# Patient Record
Sex: Male | Born: 1945 | Race: White | Hispanic: No | Marital: Single | Smoking: Current every day smoker
Health system: Southern US, Community
[De-identification: ages and names within clinical notes are randomized; demographics above are authoritative.]

## PROBLEM LIST (undated history)

## (undated) DIAGNOSIS — C801 Malignant (primary) neoplasm, unspecified: Secondary | ICD-10-CM

## (undated) DIAGNOSIS — K219 Gastro-esophageal reflux disease without esophagitis: Secondary | ICD-10-CM

## (undated) DIAGNOSIS — I739 Peripheral vascular disease, unspecified: Secondary | ICD-10-CM

## (undated) DIAGNOSIS — C3490 Malignant neoplasm of unspecified part of unspecified bronchus or lung: Secondary | ICD-10-CM

## (undated) DIAGNOSIS — F4310 Post-traumatic stress disorder, unspecified: Secondary | ICD-10-CM

## (undated) DIAGNOSIS — F1011 Alcohol abuse, in remission: Secondary | ICD-10-CM

## (undated) DIAGNOSIS — F1010 Alcohol abuse, uncomplicated: Secondary | ICD-10-CM

## (undated) DIAGNOSIS — J449 Chronic obstructive pulmonary disease, unspecified: Secondary | ICD-10-CM

## (undated) DIAGNOSIS — F419 Anxiety disorder, unspecified: Secondary | ICD-10-CM

---

## 2006-02-07 ENCOUNTER — Inpatient Hospital Stay (HOSPITAL_COMMUNITY): Admission: EM | Admit: 2006-02-07 | Discharge: 2006-02-26 | Payer: Self-pay | Admitting: Emergency Medicine

## 2006-02-16 ENCOUNTER — Ambulatory Visit: Payer: Self-pay | Admitting: Physical Medicine & Rehabilitation

## 2007-03-19 ENCOUNTER — Emergency Department (HOSPITAL_COMMUNITY): Admission: EM | Admit: 2007-03-19 | Discharge: 2007-03-19 | Payer: Self-pay | Admitting: Emergency Medicine

## 2007-11-18 ENCOUNTER — Emergency Department (HOSPITAL_COMMUNITY): Admission: EM | Admit: 2007-11-18 | Discharge: 2007-11-18 | Payer: Self-pay | Admitting: Family Medicine

## 2008-04-07 ENCOUNTER — Ambulatory Visit: Payer: Self-pay | Admitting: Psychiatry

## 2008-04-07 ENCOUNTER — Other Ambulatory Visit: Payer: Self-pay | Admitting: Emergency Medicine

## 2008-04-07 ENCOUNTER — Inpatient Hospital Stay (HOSPITAL_COMMUNITY): Admission: RE | Admit: 2008-04-07 | Discharge: 2008-04-10 | Payer: Self-pay | Admitting: Psychiatry

## 2008-04-11 ENCOUNTER — Emergency Department (HOSPITAL_COMMUNITY): Admission: EM | Admit: 2008-04-11 | Discharge: 2008-04-12 | Payer: Self-pay | Admitting: Emergency Medicine

## 2008-04-16 ENCOUNTER — Emergency Department (HOSPITAL_COMMUNITY): Admission: EM | Admit: 2008-04-16 | Discharge: 2008-04-16 | Payer: Self-pay | Admitting: Emergency Medicine

## 2008-04-20 ENCOUNTER — Emergency Department (HOSPITAL_COMMUNITY): Admission: EM | Admit: 2008-04-20 | Discharge: 2008-04-20 | Payer: Self-pay | Admitting: Emergency Medicine

## 2008-04-27 ENCOUNTER — Emergency Department (HOSPITAL_COMMUNITY): Admission: EM | Admit: 2008-04-27 | Discharge: 2008-04-28 | Payer: Self-pay | Admitting: Emergency Medicine

## 2008-05-01 ENCOUNTER — Emergency Department (HOSPITAL_COMMUNITY): Admission: EM | Admit: 2008-05-01 | Discharge: 2008-05-02 | Payer: Self-pay | Admitting: Emergency Medicine

## 2008-05-06 ENCOUNTER — Emergency Department (HOSPITAL_COMMUNITY): Admission: EM | Admit: 2008-05-06 | Discharge: 2008-05-07 | Payer: Self-pay | Admitting: Emergency Medicine

## 2008-05-07 ENCOUNTER — Emergency Department (HOSPITAL_COMMUNITY): Admission: EM | Admit: 2008-05-07 | Discharge: 2008-05-07 | Payer: Self-pay | Admitting: Emergency Medicine

## 2008-05-10 ENCOUNTER — Emergency Department (HOSPITAL_COMMUNITY): Admission: EM | Admit: 2008-05-10 | Discharge: 2008-05-10 | Payer: Self-pay | Admitting: Emergency Medicine

## 2008-05-15 ENCOUNTER — Emergency Department (HOSPITAL_COMMUNITY): Admission: EM | Admit: 2008-05-15 | Discharge: 2008-05-15 | Payer: Self-pay | Admitting: Emergency Medicine

## 2008-05-23 ENCOUNTER — Emergency Department (HOSPITAL_COMMUNITY): Admission: EM | Admit: 2008-05-23 | Discharge: 2008-05-24 | Payer: Self-pay | Admitting: Emergency Medicine

## 2008-05-31 ENCOUNTER — Emergency Department (HOSPITAL_COMMUNITY): Admission: EM | Admit: 2008-05-31 | Discharge: 2008-06-01 | Payer: Self-pay | Admitting: Emergency Medicine

## 2008-06-05 ENCOUNTER — Emergency Department (HOSPITAL_COMMUNITY): Admission: EM | Admit: 2008-06-05 | Discharge: 2008-06-05 | Payer: Self-pay | Admitting: Emergency Medicine

## 2008-06-06 ENCOUNTER — Emergency Department (HOSPITAL_COMMUNITY): Admission: EM | Admit: 2008-06-06 | Discharge: 2008-06-06 | Payer: Self-pay | Admitting: Emergency Medicine

## 2008-06-08 ENCOUNTER — Emergency Department (HOSPITAL_COMMUNITY): Admission: EM | Admit: 2008-06-08 | Discharge: 2008-06-08 | Payer: Self-pay | Admitting: Emergency Medicine

## 2008-06-10 ENCOUNTER — Emergency Department (HOSPITAL_COMMUNITY): Admission: EM | Admit: 2008-06-10 | Discharge: 2008-06-10 | Payer: Self-pay | Admitting: Emergency Medicine

## 2008-06-19 ENCOUNTER — Emergency Department (HOSPITAL_COMMUNITY): Admission: EM | Admit: 2008-06-19 | Discharge: 2008-06-19 | Payer: Self-pay | Admitting: Emergency Medicine

## 2008-06-25 ENCOUNTER — Emergency Department (HOSPITAL_COMMUNITY): Admission: EM | Admit: 2008-06-25 | Discharge: 2008-06-25 | Payer: Self-pay | Admitting: Emergency Medicine

## 2008-07-18 ENCOUNTER — Emergency Department (HOSPITAL_COMMUNITY): Admission: EM | Admit: 2008-07-18 | Discharge: 2008-07-18 | Payer: Self-pay | Admitting: Emergency Medicine

## 2008-08-04 ENCOUNTER — Encounter: Payer: Self-pay | Admitting: Emergency Medicine

## 2008-08-04 ENCOUNTER — Ambulatory Visit: Payer: Self-pay | Admitting: Psychiatry

## 2008-08-05 ENCOUNTER — Inpatient Hospital Stay (HOSPITAL_COMMUNITY): Admission: RE | Admit: 2008-08-05 | Discharge: 2008-08-10 | Payer: Self-pay | Admitting: Psychiatry

## 2008-09-21 ENCOUNTER — Ambulatory Visit: Payer: Self-pay | Admitting: Cardiology

## 2008-09-24 DIAGNOSIS — J4489 Other specified chronic obstructive pulmonary disease: Secondary | ICD-10-CM | POA: Insufficient documentation

## 2008-09-24 DIAGNOSIS — F4310 Post-traumatic stress disorder, unspecified: Secondary | ICD-10-CM

## 2008-09-24 DIAGNOSIS — J449 Chronic obstructive pulmonary disease, unspecified: Secondary | ICD-10-CM

## 2008-09-24 DIAGNOSIS — E039 Hypothyroidism, unspecified: Secondary | ICD-10-CM | POA: Insufficient documentation

## 2008-09-24 DIAGNOSIS — F1010 Alcohol abuse, uncomplicated: Secondary | ICD-10-CM

## 2008-09-29 ENCOUNTER — Emergency Department (HOSPITAL_COMMUNITY): Admission: EM | Admit: 2008-09-29 | Discharge: 2008-09-29 | Payer: Self-pay | Admitting: Emergency Medicine

## 2008-10-04 ENCOUNTER — Ambulatory Visit: Payer: Self-pay | Admitting: Oncology

## 2008-10-07 ENCOUNTER — Other Ambulatory Visit: Payer: Self-pay | Admitting: Emergency Medicine

## 2008-10-07 ENCOUNTER — Ambulatory Visit: Payer: Self-pay | Admitting: *Deleted

## 2008-10-07 ENCOUNTER — Inpatient Hospital Stay (HOSPITAL_COMMUNITY): Admission: RE | Admit: 2008-10-07 | Discharge: 2008-10-15 | Payer: Self-pay | Admitting: *Deleted

## 2008-10-20 ENCOUNTER — Emergency Department (HOSPITAL_COMMUNITY): Admission: EM | Admit: 2008-10-20 | Discharge: 2008-10-20 | Payer: Self-pay | Admitting: Emergency Medicine

## 2008-10-25 ENCOUNTER — Ambulatory Visit (HOSPITAL_COMMUNITY): Admission: RE | Admit: 2008-10-25 | Discharge: 2008-10-25 | Payer: Self-pay | Admitting: Family Medicine

## 2008-10-25 ENCOUNTER — Ambulatory Visit: Payer: Self-pay | Admitting: Vascular Surgery

## 2008-10-25 ENCOUNTER — Encounter: Payer: Self-pay | Admitting: Family Medicine

## 2008-11-07 ENCOUNTER — Ambulatory Visit: Payer: Self-pay

## 2008-11-07 ENCOUNTER — Encounter: Payer: Self-pay | Admitting: Cardiology

## 2008-11-07 LAB — CONVERTED CEMR LAB
ALT: 19 units/L (ref 0–53)
AST: 18 units/L (ref 0–37)
CO2: 27 meq/L (ref 19–32)
Calcium: 9 mg/dL (ref 8.4–10.5)
Cholesterol: 215 mg/dL (ref 0–200)
Direct LDL: 145 mg/dL
Eosinophils Relative: 3.9 % (ref 0.0–5.0)
Free T4: 0.6 ng/dL (ref 0.6–1.6)
GFR calc Af Amer: 110 mL/min
Glucose, Bld: 121 mg/dL — ABNORMAL HIGH (ref 70–99)
Monocytes Relative: 7.6 % (ref 3.0–12.0)
Neutro Abs: 4.6 10*3/uL (ref 1.4–7.7)
Neutrophils Relative %: 63.8 % (ref 43.0–77.0)
Sodium: 140 meq/L (ref 135–145)
Total Protein: 6.9 g/dL (ref 6.0–8.3)
VLDL: 66 mg/dL — ABNORMAL HIGH (ref 0–40)
WBC: 7.4 10*3/uL (ref 4.5–10.5)

## 2008-12-03 ENCOUNTER — Emergency Department (HOSPITAL_COMMUNITY): Admission: EM | Admit: 2008-12-03 | Discharge: 2008-12-03 | Payer: Self-pay | Admitting: Emergency Medicine

## 2008-12-20 ENCOUNTER — Ambulatory Visit (HOSPITAL_COMMUNITY): Admission: RE | Admit: 2008-12-20 | Discharge: 2008-12-20 | Payer: Self-pay | Admitting: Urology

## 2009-01-02 ENCOUNTER — Emergency Department (HOSPITAL_COMMUNITY): Admission: EM | Admit: 2009-01-02 | Discharge: 2009-01-02 | Payer: Self-pay | Admitting: Emergency Medicine

## 2009-01-02 ENCOUNTER — Emergency Department (HOSPITAL_COMMUNITY): Admission: EM | Admit: 2009-01-02 | Discharge: 2009-01-03 | Payer: Self-pay | Admitting: Emergency Medicine

## 2009-01-16 ENCOUNTER — Emergency Department (HOSPITAL_COMMUNITY): Admission: EM | Admit: 2009-01-16 | Discharge: 2009-01-17 | Payer: Self-pay | Admitting: Emergency Medicine

## 2009-02-04 ENCOUNTER — Emergency Department (HOSPITAL_COMMUNITY): Admission: EM | Admit: 2009-02-04 | Discharge: 2009-02-04 | Payer: Self-pay | Admitting: Emergency Medicine

## 2009-05-05 ENCOUNTER — Emergency Department (HOSPITAL_COMMUNITY): Admission: EM | Admit: 2009-05-05 | Discharge: 2009-05-05 | Payer: Self-pay | Admitting: Emergency Medicine

## 2009-05-08 ENCOUNTER — Emergency Department (HOSPITAL_COMMUNITY): Admission: EM | Admit: 2009-05-08 | Discharge: 2009-05-08 | Payer: Self-pay | Admitting: Emergency Medicine

## 2009-05-27 ENCOUNTER — Other Ambulatory Visit: Payer: Self-pay | Admitting: Emergency Medicine

## 2009-05-28 ENCOUNTER — Inpatient Hospital Stay (HOSPITAL_COMMUNITY): Admission: AD | Admit: 2009-05-28 | Discharge: 2009-06-04 | Payer: Self-pay | Admitting: Psychiatry

## 2009-05-28 ENCOUNTER — Ambulatory Visit: Payer: Self-pay | Admitting: Psychiatry

## 2009-06-01 ENCOUNTER — Encounter (HOSPITAL_COMMUNITY): Payer: Self-pay | Admitting: Psychiatry

## 2009-08-29 ENCOUNTER — Other Ambulatory Visit (HOSPITAL_COMMUNITY): Payer: Self-pay | Admitting: Emergency Medicine

## 2009-08-29 ENCOUNTER — Ambulatory Visit: Payer: Self-pay | Admitting: Psychiatry

## 2009-08-29 ENCOUNTER — Inpatient Hospital Stay (HOSPITAL_COMMUNITY): Admission: AD | Admit: 2009-08-29 | Discharge: 2009-09-03 | Payer: Self-pay | Admitting: Psychiatry

## 2009-12-08 ENCOUNTER — Emergency Department (HOSPITAL_COMMUNITY): Admission: EM | Admit: 2009-12-08 | Discharge: 2009-12-08 | Payer: Self-pay | Admitting: Emergency Medicine

## 2010-01-06 ENCOUNTER — Emergency Department (HOSPITAL_COMMUNITY): Admission: EM | Admit: 2010-01-06 | Discharge: 2010-01-06 | Payer: Self-pay | Admitting: Emergency Medicine

## 2010-01-14 ENCOUNTER — Inpatient Hospital Stay (HOSPITAL_COMMUNITY): Admission: EM | Admit: 2010-01-14 | Discharge: 2010-01-16 | Payer: Self-pay | Admitting: Emergency Medicine

## 2010-01-14 ENCOUNTER — Ambulatory Visit: Payer: Self-pay | Admitting: Cardiology

## 2010-01-16 ENCOUNTER — Encounter (INDEPENDENT_AMBULATORY_CARE_PROVIDER_SITE_OTHER): Payer: Self-pay | Admitting: Internal Medicine

## 2010-02-04 ENCOUNTER — Other Ambulatory Visit: Payer: Self-pay | Admitting: Emergency Medicine

## 2010-02-04 ENCOUNTER — Ambulatory Visit: Payer: Self-pay | Admitting: Psychiatry

## 2010-02-04 ENCOUNTER — Inpatient Hospital Stay (HOSPITAL_COMMUNITY): Admission: AD | Admit: 2010-02-04 | Discharge: 2010-02-11 | Payer: Self-pay | Admitting: Psychiatry

## 2010-03-13 ENCOUNTER — Emergency Department (HOSPITAL_COMMUNITY): Admission: EM | Admit: 2010-03-13 | Discharge: 2010-03-13 | Payer: Self-pay | Admitting: Gastroenterology

## 2010-03-23 ENCOUNTER — Emergency Department (HOSPITAL_COMMUNITY): Admission: EM | Admit: 2010-03-23 | Discharge: 2010-03-24 | Payer: Self-pay | Admitting: Emergency Medicine

## 2010-03-24 ENCOUNTER — Ambulatory Visit: Payer: Self-pay | Admitting: Psychiatry

## 2010-03-31 ENCOUNTER — Emergency Department (HOSPITAL_COMMUNITY): Admission: EM | Admit: 2010-03-31 | Discharge: 2010-03-31 | Payer: Self-pay | Admitting: Emergency Medicine

## 2010-04-04 ENCOUNTER — Emergency Department (HOSPITAL_COMMUNITY): Admission: EM | Admit: 2010-04-04 | Discharge: 2010-04-04 | Payer: Self-pay | Admitting: Emergency Medicine

## 2010-04-06 ENCOUNTER — Emergency Department (HOSPITAL_COMMUNITY): Admission: EM | Admit: 2010-04-06 | Discharge: 2010-04-06 | Payer: Self-pay | Admitting: Emergency Medicine

## 2010-04-12 ENCOUNTER — Inpatient Hospital Stay (HOSPITAL_COMMUNITY): Admission: AD | Admit: 2010-04-12 | Discharge: 2010-04-20 | Payer: Self-pay | Admitting: Psychiatry

## 2010-04-12 ENCOUNTER — Other Ambulatory Visit: Payer: Self-pay | Admitting: Emergency Medicine

## 2010-05-08 ENCOUNTER — Emergency Department (HOSPITAL_COMMUNITY): Admission: EM | Admit: 2010-05-08 | Discharge: 2010-05-08 | Payer: Self-pay | Admitting: Emergency Medicine

## 2010-05-29 ENCOUNTER — Emergency Department (HOSPITAL_COMMUNITY): Admission: EM | Admit: 2010-05-29 | Discharge: 2010-05-29 | Payer: Self-pay | Admitting: Emergency Medicine

## 2010-07-07 ENCOUNTER — Emergency Department (HOSPITAL_COMMUNITY): Admission: EM | Admit: 2010-07-07 | Discharge: 2010-07-07 | Payer: Self-pay | Admitting: Emergency Medicine

## 2010-07-16 ENCOUNTER — Emergency Department (HOSPITAL_COMMUNITY): Admission: EM | Admit: 2010-07-16 | Discharge: 2010-07-16 | Payer: Self-pay | Admitting: Emergency Medicine

## 2010-07-25 ENCOUNTER — Emergency Department (HOSPITAL_COMMUNITY): Admission: EM | Admit: 2010-07-25 | Discharge: 2010-07-25 | Payer: Self-pay | Admitting: Emergency Medicine

## 2010-08-03 ENCOUNTER — Emergency Department (HOSPITAL_COMMUNITY): Admission: EM | Admit: 2010-08-03 | Discharge: 2010-08-03 | Payer: Self-pay | Admitting: Emergency Medicine

## 2010-08-05 ENCOUNTER — Emergency Department (HOSPITAL_COMMUNITY): Admission: EM | Admit: 2010-08-05 | Discharge: 2010-08-05 | Payer: Self-pay | Admitting: Emergency Medicine

## 2010-08-18 ENCOUNTER — Emergency Department (HOSPITAL_COMMUNITY): Admission: EM | Admit: 2010-08-18 | Discharge: 2010-08-19 | Payer: Self-pay | Admitting: Emergency Medicine

## 2010-09-19 ENCOUNTER — Emergency Department (HOSPITAL_COMMUNITY): Admission: EM | Admit: 2010-09-19 | Discharge: 2010-08-23 | Payer: Self-pay | Admitting: Emergency Medicine

## 2010-11-23 ENCOUNTER — Emergency Department (HOSPITAL_COMMUNITY)
Admission: EM | Admit: 2010-11-23 | Discharge: 2010-11-23 | Disposition: A | Discharge status: Home / Self Care | Payer: Medicare Other | Attending: Emergency Medicine | Admitting: Emergency Medicine

## 2010-11-23 DIAGNOSIS — R209 Unspecified disturbances of skin sensation: Secondary | ICD-10-CM | POA: Insufficient documentation

## 2010-11-23 DIAGNOSIS — R51 Headache: Secondary | ICD-10-CM | POA: Insufficient documentation

## 2010-11-23 DIAGNOSIS — Z85820 Personal history of malignant melanoma of skin: Secondary | ICD-10-CM | POA: Insufficient documentation

## 2010-11-23 DIAGNOSIS — F411 Generalized anxiety disorder: Secondary | ICD-10-CM | POA: Insufficient documentation

## 2010-11-23 LAB — COMPREHENSIVE METABOLIC PANEL
ALT: 15 U/L (ref 0–53)
Alkaline Phosphatase: 60 U/L (ref 39–117)
BUN: 10 mg/dL (ref 6–23)
Calcium: 9.2 mg/dL (ref 8.4–10.5)
Creatinine, Ser: 0.73 mg/dL (ref 0.4–1.5)
GFR calc Af Amer: >60 mL/min (ref >60)
Glucose, Bld: 101 mg/dL — ABNORMAL HIGH (ref 70–99)
Sodium: 137 mEq/L (ref 135–145)
Total Bilirubin: 0.4 mg/dL (ref 0.3–1.2)

## 2010-11-23 LAB — RAPID URINE DRUG SCREEN, HOSP PERFORMED
Amphetamines: NOT DETECTED
Barbiturates: NOT DETECTED
Opiates: NOT DETECTED

## 2010-11-23 LAB — CBC
Hemoglobin: 15.4 g/dL (ref 13.0–17.0)
MCH: 29.8 pg (ref 26.0–34.0)
MCV: 89.3 fL (ref 78.0–100.0)
WBC: 7.3 10*3/uL (ref 4.0–10.5)

## 2010-11-23 LAB — DIFFERENTIAL
Eosinophils Relative: 4 % (ref 0–5)
Lymphocytes Relative: 26 % (ref 12–46)
Lymphs Abs: 1.9 10*3/uL (ref 0.7–4.0)
Monocytes Absolute: 0.6 10*3/uL (ref 0.1–1.0)
Monocytes Relative: 8 % (ref 3–12)
Neutro Abs: 4.4 10*3/uL (ref 1.7–7.7)

## 2010-11-23 LAB — ETHANOL: Alcohol, Ethyl (B): <5 mg/dL (ref 0–10)

## 2010-12-10 ENCOUNTER — Emergency Department (HOSPITAL_COMMUNITY)
Admission: EM | Admit: 2010-12-10 | Discharge: 2010-12-10 | Disposition: A | Discharge status: Home / Self Care | Payer: Medicare Other | Attending: Emergency Medicine | Admitting: Emergency Medicine

## 2010-12-10 DIAGNOSIS — Z85819 Personal history of malignant neoplasm of unspecified site of lip, oral cavity, and pharynx: Secondary | ICD-10-CM | POA: Insufficient documentation

## 2010-12-10 DIAGNOSIS — F4310 Post-traumatic stress disorder, unspecified: Secondary | ICD-10-CM | POA: Insufficient documentation

## 2010-12-10 DIAGNOSIS — F411 Generalized anxiety disorder: Secondary | ICD-10-CM | POA: Insufficient documentation

## 2010-12-10 DIAGNOSIS — Z85820 Personal history of malignant melanoma of skin: Secondary | ICD-10-CM | POA: Insufficient documentation

## 2010-12-24 LAB — RAPID URINE DRUG SCREEN, HOSP PERFORMED
Barbiturates: NOT DETECTED
Cocaine: NOT DETECTED
Opiates: NOT DETECTED
Tetrahydrocannabinol: NOT DETECTED

## 2010-12-24 LAB — CBC
HCT: 50.5 % (ref 39.0–52.0)
Hemoglobin: 17.3 g/dL — ABNORMAL HIGH (ref 13.0–17.0)
Platelets: 185 10*3/uL (ref 150–400)
RBC: 5.36 MIL/uL (ref 4.22–5.81)
RDW: 13.6 % (ref 11.5–15.5)

## 2010-12-24 LAB — MAGNESIUM: Magnesium: 2.3 mg/dL (ref 1.5–2.5)

## 2010-12-24 LAB — DIFFERENTIAL
Eosinophils Absolute: 0.1 10*3/uL (ref 0.0–0.7)
Eosinophils Relative: 2 % (ref 0–5)
Lymphocytes Relative: 31 % (ref 12–46)
Lymphs Abs: 2.1 10*3/uL (ref 0.7–4.0)

## 2010-12-24 LAB — URINALYSIS, ROUTINE W REFLEX MICROSCOPIC
Bilirubin Urine: NEGATIVE
Nitrite: NEGATIVE
Urobilinogen, UA: 0.2 mg/dL (ref 0.0–1.0)

## 2010-12-24 LAB — POCT CARDIAC MARKERS
CKMB, poc: <1 ng/mL — ABNORMAL LOW (ref 1.0–8.0)
CKMB, poc: <1 ng/mL — ABNORMAL LOW (ref 1.0–8.0)
CKMB, poc: <1 ng/mL — ABNORMAL LOW (ref 1.0–8.0)
Myoglobin, poc: 42.8 ng/mL (ref 12–200)
Troponin i, poc: <0.05 ng/mL (ref 0.00–0.09)

## 2010-12-24 LAB — COMPREHENSIVE METABOLIC PANEL
BUN: 4 mg/dL — ABNORMAL LOW (ref 6–23)
Creatinine, Ser: 0.71 mg/dL (ref 0.4–1.5)
GFR calc Af Amer: >60 mL/min (ref >60)
Potassium: 3.8 mEq/L (ref 3.5–5.1)

## 2010-12-25 LAB — CBC
HCT: 49.7 % (ref 39.0–52.0)
Hemoglobin: 17 g/dL (ref 13.0–17.0)
RBC: 5.32 MIL/uL (ref 4.22–5.81)

## 2010-12-25 LAB — DIFFERENTIAL
Lymphs Abs: 2 10*3/uL (ref 0.7–4.0)
Monocytes Relative: 10 % (ref 3–12)
Neutro Abs: 4.6 10*3/uL (ref 1.7–7.7)
Neutrophils Relative %: 61 % (ref 43–77)

## 2010-12-25 LAB — BASIC METABOLIC PANEL
CO2: 18 mEq/L — ABNORMAL LOW (ref 19–32)
Calcium: 9 mg/dL (ref 8.4–10.5)
GFR calc Af Amer: >60 mL/min (ref >60)
GFR calc non Af Amer: >60 mL/min (ref >60)
Potassium: 4.2 mEq/L (ref 3.5–5.1)
Sodium: 135 mEq/L (ref 135–145)

## 2010-12-25 LAB — POCT CARDIAC MARKERS
CKMB, poc: <1 ng/mL — ABNORMAL LOW (ref 1.0–8.0)
Myoglobin, poc: 39 ng/mL (ref 12–200)
Troponin i, poc: <0.05 ng/mL (ref 0.00–0.09)

## 2010-12-26 LAB — COMPREHENSIVE METABOLIC PANEL
AST: 49 U/L — ABNORMAL HIGH (ref 0–37)
CO2: 23 mEq/L (ref 19–32)
Calcium: 9.6 mg/dL (ref 8.4–10.5)
Creatinine, Ser: 0.88 mg/dL (ref 0.4–1.5)
GFR calc Af Amer: >60 mL/min (ref >60)
GFR calc non Af Amer: >60 mL/min (ref >60)

## 2010-12-26 LAB — URINALYSIS, ROUTINE W REFLEX MICROSCOPIC
Ketones, ur: 40 mg/dL — AB
Nitrite: NEGATIVE
Protein, ur: NEGATIVE mg/dL
Urobilinogen, UA: 0.2 mg/dL (ref 0.0–1.0)

## 2010-12-26 LAB — DIFFERENTIAL
Eosinophils Relative: 1 % (ref 0–5)
Lymphocytes Relative: 29 % (ref 12–46)
Lymphs Abs: 2.7 10*3/uL (ref 0.7–4.0)

## 2010-12-26 LAB — CBC
Hemoglobin: 17.8 g/dL — ABNORMAL HIGH (ref 13.0–17.0)
MCH: 32 pg (ref 26.0–34.0)
MCHC: 34.3 g/dL (ref 30.0–36.0)
Platelets: 183 10*3/uL (ref 150–400)

## 2010-12-28 LAB — RAPID URINE DRUG SCREEN, HOSP PERFORMED
Amphetamines: NOT DETECTED
Barbiturates: NOT DETECTED
Benzodiazepines: NOT DETECTED
Opiates: NOT DETECTED

## 2010-12-28 LAB — BASIC METABOLIC PANEL
BUN: 6 mg/dL (ref 6–23)
CO2: 23 mEq/L (ref 19–32)
Chloride: 108 mEq/L (ref 96–112)
Creatinine, Ser: 0.82 mg/dL (ref 0.4–1.5)
Glucose, Bld: 105 mg/dL — ABNORMAL HIGH (ref 70–99)

## 2010-12-28 LAB — CBC
MCH: 31.7 pg (ref 26.0–34.0)
MCV: 92.7 fL (ref 78.0–100.0)
Platelets: 172 10*3/uL (ref 150–400)
RDW: 13.5 % (ref 11.5–15.5)

## 2010-12-28 LAB — DIFFERENTIAL
Basophils Absolute: 0.1 10*3/uL (ref 0.0–0.1)
Eosinophils Absolute: 0.3 10*3/uL (ref 0.0–0.7)
Eosinophils Relative: 5 % (ref 0–5)

## 2010-12-29 LAB — DIFFERENTIAL
Basophils Absolute: 0.1 10*3/uL (ref 0.0–0.1)
Basophils Absolute: 0 10*3/uL (ref 0.0–0.1)
Basophils Relative: 1 % (ref 0–1)
Basophils Relative: 1 % (ref 0–1)
Eosinophils Absolute: 0.2 10*3/uL (ref 0.0–0.7)
Eosinophils Relative: 3 % (ref 0–5)
Lymphocytes Relative: 22 % (ref 12–46)
Lymphocytes Relative: 28 % (ref 12–46)
Lymphocytes Relative: 29 % (ref 12–46)
Lymphs Abs: 1.8 10*3/uL (ref 0.7–4.0)
Monocytes Absolute: 0.6 10*3/uL (ref 0.1–1.0)
Monocytes Absolute: 0.6 10*3/uL (ref 0.1–1.0)
Monocytes Relative: 8 % (ref 3–12)
Monocytes Relative: 10 % (ref 3–12)
Neutro Abs: 5.5 10*3/uL (ref 1.7–7.7)
Neutro Abs: 3.6 10*3/uL (ref 1.7–7.7)
Neutrophils Relative %: 67 % (ref 43–77)
Neutrophils Relative %: 58 % (ref 43–77)

## 2010-12-29 LAB — HEPATIC FUNCTION PANEL
Alkaline Phosphatase: 54 U/L (ref 39–117)
Bilirubin, Direct: <0.1 mg/dL (ref 0.0–0.3)
Total Protein: 6.1 g/dL (ref 6.0–8.3)

## 2010-12-29 LAB — COMPREHENSIVE METABOLIC PANEL
ALT: 33 U/L (ref 0–53)
AST: 42 U/L — ABNORMAL HIGH (ref 0–37)
Alkaline Phosphatase: 65 U/L (ref 39–117)
CO2: 22 mEq/L (ref 19–32)
GFR calc Af Amer: >60 mL/min (ref >60)
GFR calc non Af Amer: >60 mL/min (ref >60)
Glucose, Bld: 107 mg/dL — ABNORMAL HIGH (ref 70–99)
Potassium: 3.8 mEq/L (ref 3.5–5.1)
Sodium: 136 mEq/L (ref 135–145)
Total Protein: 7.2 g/dL (ref 6.0–8.3)

## 2010-12-29 LAB — POCT I-STAT, CHEM 8
BUN: 3 mg/dL — ABNORMAL LOW (ref 6–23)
Calcium, Ion: 1.11 mmol/L — ABNORMAL LOW (ref 1.12–1.32)
HCT: 54 % — ABNORMAL HIGH (ref 39.0–52.0)
Hemoglobin: 18.4 g/dL — ABNORMAL HIGH (ref 13.0–17.0)
Sodium: 137 mEq/L (ref 135–145)
TCO2: 24 mmol/L (ref 0–100)

## 2010-12-29 LAB — CBC
HCT: 49.1 % (ref 39.0–52.0)
Hemoglobin: 16.9 g/dL (ref 13.0–17.0)
Hemoglobin: 16.9 g/dL (ref 13.0–17.0)
Hemoglobin: 16.6 g/dL (ref 13.0–17.0)
MCH: 32.5 pg (ref 26.0–34.0)
MCHC: 34 g/dL (ref 30.0–36.0)
MCHC: 33.1 g/dL (ref 30.0–36.0)
MCV: 95.9 fL (ref 78.0–100.0)
Platelets: 174 10*3/uL (ref 150–400)
RBC: 5.2 MIL/uL (ref 4.22–5.81)
RBC: 5.15 MIL/uL (ref 4.22–5.81)
RBC: 5.23 MIL/uL (ref 4.22–5.81)
RDW: 14.2 % (ref 11.5–15.5)
WBC: 8.2 10*3/uL (ref 4.0–10.5)
WBC: 6 10*3/uL (ref 4.0–10.5)
WBC: 7.1 10*3/uL (ref 4.0–10.5)
WBC: 6.2 10*3/uL (ref 4.0–10.5)

## 2010-12-29 LAB — BASIC METABOLIC PANEL
CO2: 27 mEq/L (ref 19–32)
CO2: 20 mEq/L (ref 19–32)
Calcium: 9.2 mg/dL (ref 8.4–10.5)
Calcium: 9.2 mg/dL (ref 8.4–10.5)
Creatinine, Ser: 0.77 mg/dL (ref 0.4–1.5)
GFR calc Af Amer: >60 mL/min (ref >60)
GFR calc Af Amer: >60 mL/min (ref >60)
GFR calc non Af Amer: >60 mL/min (ref >60)
Sodium: 138 mEq/L (ref 135–145)
Sodium: 139 mEq/L (ref 135–145)

## 2010-12-29 LAB — ETHANOL
Alcohol, Ethyl (B): 48 mg/dL — ABNORMAL HIGH (ref 0–10)
Alcohol, Ethyl (B): 33 mg/dL — ABNORMAL HIGH (ref 0–10)

## 2010-12-29 LAB — RAPID URINE DRUG SCREEN, HOSP PERFORMED
Amphetamines: NOT DETECTED
Amphetamines: NOT DETECTED
Barbiturates: NOT DETECTED
Benzodiazepines: POSITIVE — AB
Benzodiazepines: NOT DETECTED
Cocaine: NOT DETECTED
Opiates: NOT DETECTED
Tetrahydrocannabinol: NOT DETECTED
Tetrahydrocannabinol: NOT DETECTED

## 2010-12-29 LAB — POCT CARDIAC MARKERS: Myoglobin, poc: 56.8 ng/mL (ref 12–200)

## 2010-12-29 LAB — VITAMIN B12: Vitamin B-12: 322 pg/mL (ref 211–911)

## 2010-12-30 LAB — RAPID URINE DRUG SCREEN, HOSP PERFORMED
Amphetamines: NOT DETECTED
Barbiturates: NOT DETECTED
Benzodiazepines: NOT DETECTED
Benzodiazepines: POSITIVE — AB
Cocaine: NOT DETECTED
Cocaine: NOT DETECTED
Opiates: NOT DETECTED
Opiates: NOT DETECTED
Tetrahydrocannabinol: NOT DETECTED

## 2010-12-30 LAB — DIFFERENTIAL
Eosinophils Absolute: 0.2 10*3/uL (ref 0.0–0.7)
Eosinophils Relative: 3 % (ref 0–5)
Lymphocytes Relative: 26 % (ref 12–46)
Lymphocytes Relative: 30 % (ref 12–46)
Lymphs Abs: 1.5 10*3/uL (ref 0.7–4.0)
Lymphs Abs: 2.1 10*3/uL (ref 0.7–4.0)
Monocytes Absolute: 0.6 10*3/uL (ref 0.1–1.0)
Monocytes Relative: 10 % (ref 3–12)
Monocytes Relative: 9 % (ref 3–12)
Neutro Abs: 4 10*3/uL (ref 1.7–7.7)
Neutrophils Relative %: 58 % (ref 43–77)

## 2010-12-30 LAB — COMPREHENSIVE METABOLIC PANEL
ALT: 23 U/L (ref 0–53)
AST: 34 U/L (ref 0–37)
Albumin: 4.2 g/dL (ref 3.5–5.2)
Albumin: 4.3 g/dL (ref 3.5–5.2)
BUN: 5 mg/dL — ABNORMAL LOW (ref 6–23)
CO2: 23 mEq/L (ref 19–32)
Calcium: 9.1 mg/dL (ref 8.4–10.5)
Calcium: 9.2 mg/dL (ref 8.4–10.5)
Creatinine, Ser: 0.83 mg/dL (ref 0.4–1.5)
GFR calc Af Amer: >60 mL/min (ref >60)
GFR calc non Af Amer: >60 mL/min (ref >60)
Glucose, Bld: 99 mg/dL (ref 70–99)
Sodium: 139 mEq/L (ref 135–145)
Total Protein: 7.3 g/dL (ref 6.0–8.3)
Total Protein: 7.7 g/dL (ref 6.0–8.3)

## 2010-12-30 LAB — URINALYSIS, ROUTINE W REFLEX MICROSCOPIC
Glucose, UA: NEGATIVE mg/dL
Hgb urine dipstick: NEGATIVE
Specific Gravity, Urine: 1.006 (ref 1.005–1.030)
Urobilinogen, UA: 0.2 mg/dL (ref 0.0–1.0)
pH: 5 (ref 5.0–8.0)

## 2010-12-30 LAB — CBC
HCT: 48 % (ref 39.0–52.0)
Hemoglobin: 16.2 g/dL (ref 13.0–17.0)
MCHC: 33.4 g/dL (ref 30.0–36.0)
MCHC: 33.8 g/dL (ref 30.0–36.0)
MCV: 96.4 fL (ref 78.0–100.0)
Platelets: 154 10*3/uL (ref 150–400)
Platelets: 157 10*3/uL (ref 150–400)
RBC: 4.87 MIL/uL (ref 4.22–5.81)
RDW: 14.4 % (ref 11.5–15.5)

## 2010-12-31 LAB — RAPID URINE DRUG SCREEN, HOSP PERFORMED
Benzodiazepines: NOT DETECTED
Cocaine: NOT DETECTED
Tetrahydrocannabinol: NOT DETECTED

## 2010-12-31 LAB — HEPATIC FUNCTION PANEL
ALT: 21 U/L (ref 0–53)
Alkaline Phosphatase: 65 U/L (ref 39–117)
Indirect Bilirubin: 0.3 mg/dL (ref 0.3–0.9)
Total Bilirubin: 0.4 mg/dL (ref 0.3–1.2)
Total Protein: 6.6 g/dL (ref 6.0–8.3)

## 2010-12-31 LAB — BASIC METABOLIC PANEL
CO2: 23 mEq/L (ref 19–32)
Calcium: 9 mg/dL (ref 8.4–10.5)
GFR calc Af Amer: >60 mL/min (ref >60)
GFR calc non Af Amer: >60 mL/min (ref >60)
Potassium: 4.1 mEq/L (ref 3.5–5.1)
Sodium: 133 mEq/L — ABNORMAL LOW (ref 135–145)

## 2010-12-31 LAB — TRICYCLICS SCREEN, URINE: TCA Scrn: NOT DETECTED

## 2010-12-31 LAB — DIFFERENTIAL
Eosinophils Relative: 2 % (ref 0–5)
Lymphocytes Relative: 32 % (ref 12–46)
Monocytes Absolute: 0.5 10*3/uL (ref 0.1–1.0)
Monocytes Relative: 8 % (ref 3–12)
Neutro Abs: 3.6 10*3/uL (ref 1.7–7.7)

## 2010-12-31 LAB — CBC
HCT: 50 % (ref 39.0–52.0)
Hemoglobin: 16.9 g/dL (ref 13.0–17.0)
MCHC: 33.7 g/dL (ref 30.0–36.0)
RBC: 5.26 MIL/uL (ref 4.22–5.81)

## 2010-12-31 LAB — T4, FREE: Free T4: 0.79 ng/dL — ABNORMAL LOW (ref 0.80–1.80)

## 2010-12-31 LAB — VITAMIN B12: Vitamin B-12: 331 pg/mL (ref 211–911)

## 2011-01-01 LAB — POCT I-STAT, CHEM 8
Calcium, Ion: 1.05 mmol/L — ABNORMAL LOW (ref 1.12–1.32)
Chloride: 101 mEq/L (ref 96–112)
HCT: 53 % — ABNORMAL HIGH (ref 39.0–52.0)
Potassium: 4 mEq/L (ref 3.5–5.1)
Sodium: 134 mEq/L — ABNORMAL LOW (ref 135–145)

## 2011-01-01 LAB — BASIC METABOLIC PANEL
CO2: 26 mEq/L (ref 19–32)
Calcium: 8.7 mg/dL (ref 8.4–10.5)
Creatinine, Ser: 0.82 mg/dL (ref 0.4–1.5)
GFR calc Af Amer: >60 mL/min (ref >60)
GFR calc non Af Amer: >60 mL/min (ref >60)
Sodium: 136 mEq/L (ref 135–145)

## 2011-01-01 LAB — CBC
Hemoglobin: 14.3 g/dL (ref 13.0–17.0)
MCHC: 34 g/dL (ref 30.0–36.0)
MCV: 93.7 fL (ref 78.0–100.0)
Platelets: 171 10*3/uL (ref 150–400)
RBC: 4.4 MIL/uL (ref 4.22–5.81)
RBC: 5.07 MIL/uL (ref 4.22–5.81)
RDW: 13.6 % (ref 11.5–15.5)
RDW: 14.5 % (ref 11.5–15.5)
WBC: 5.1 10*3/uL (ref 4.0–10.5)

## 2011-01-01 LAB — URINALYSIS, ROUTINE W REFLEX MICROSCOPIC
Glucose, UA: NEGATIVE mg/dL
Hgb urine dipstick: NEGATIVE
Ketones, ur: NEGATIVE mg/dL
Protein, ur: NEGATIVE mg/dL

## 2011-01-01 LAB — CARDIAC PANEL(CRET KIN+CKTOT+MB+TROPI)
CK, MB: 1.1 ng/mL (ref 0.3–4.0)
Relative Index: INVALID (ref 0.0–2.5)
Total CK: 29 U/L (ref 7–232)
Troponin I: 0.02 ng/mL (ref 0.00–0.06)
Troponin I: <0.01 ng/mL (ref 0.00–0.06)

## 2011-01-01 LAB — LIPID PANEL
Cholesterol: 219 mg/dL — ABNORMAL HIGH (ref 0–200)
HDL: 33 mg/dL — ABNORMAL LOW (ref >39)
Total CHOL/HDL Ratio: 6.6 RATIO
VLDL: 33 mg/dL (ref 0–40)

## 2011-01-01 LAB — COMPREHENSIVE METABOLIC PANEL
ALT: 35 U/L (ref 0–53)
ALT: 27 U/L (ref 0–53)
AST: 37 U/L (ref 0–37)
AST: 35 U/L (ref 0–37)
CO2: 23 mEq/L (ref 19–32)
CO2: 21 mEq/L (ref 19–32)
Calcium: 8.9 mg/dL (ref 8.4–10.5)
Chloride: 103 mEq/L (ref 96–112)
Creatinine, Ser: 0.72 mg/dL (ref 0.4–1.5)
Creatinine, Ser: 0.76 mg/dL (ref 0.4–1.5)
GFR calc Af Amer: >60 mL/min (ref >60)
GFR calc Af Amer: >60 mL/min (ref >60)
GFR calc non Af Amer: >60 mL/min (ref >60)
GFR calc non Af Amer: >60 mL/min (ref >60)
Glucose, Bld: 122 mg/dL — ABNORMAL HIGH (ref 70–99)
Sodium: 132 mEq/L — ABNORMAL LOW (ref 135–145)
Total Bilirubin: 1 mg/dL (ref 0.3–1.2)
Total Protein: 7.2 g/dL (ref 6.0–8.3)

## 2011-01-01 LAB — RAPID URINE DRUG SCREEN, HOSP PERFORMED
Amphetamines: NOT DETECTED
Barbiturates: NOT DETECTED
Cocaine: NOT DETECTED
Opiates: NOT DETECTED
Tetrahydrocannabinol: NOT DETECTED

## 2011-01-01 LAB — POCT CARDIAC MARKERS
Myoglobin, poc: 31.8 ng/mL (ref 12–200)
Troponin i, poc: <0.05 ng/mL (ref 0.00–0.09)

## 2011-01-01 LAB — D-DIMER, QUANTITATIVE: D-Dimer, Quant: 0.46 ug/mL-FEU (ref 0.00–0.48)

## 2011-01-01 LAB — DIFFERENTIAL
Basophils Absolute: 0.1 10*3/uL (ref 0.0–0.1)
Lymphocytes Relative: 26 % (ref 12–46)
Lymphocytes Relative: 23 % (ref 12–46)
Lymphs Abs: 1.2 10*3/uL (ref 0.7–4.0)
Monocytes Relative: 12 % (ref 3–12)
Neutro Abs: 3.7 10*3/uL (ref 1.7–7.7)
Neutrophils Relative %: 62 % (ref 43–77)
Neutrophils Relative %: 62 % (ref 43–77)

## 2011-01-01 LAB — ETHANOL: Alcohol, Ethyl (B): <5 mg/dL (ref 0–10)

## 2011-01-05 LAB — COMPREHENSIVE METABOLIC PANEL
Alkaline Phosphatase: 77 U/L (ref 39–117)
BUN: 5 mg/dL — ABNORMAL LOW (ref 6–23)
CO2: 22 mEq/L (ref 19–32)
Chloride: 97 mEq/L (ref 96–112)
Creatinine, Ser: 0.72 mg/dL (ref 0.4–1.5)
GFR calc non Af Amer: >60 mL/min (ref >60)
Glucose, Bld: 86 mg/dL (ref 70–99)
Potassium: 3.4 mEq/L — ABNORMAL LOW (ref 3.5–5.1)
Total Bilirubin: 0.3 mg/dL (ref 0.3–1.2)

## 2011-01-05 LAB — DIFFERENTIAL
Basophils Absolute: 0.1 10*3/uL (ref 0.0–0.1)
Basophils Relative: 1 % (ref 0–1)
Lymphocytes Relative: 35 % (ref 12–46)
Monocytes Absolute: 0.6 10*3/uL (ref 0.1–1.0)
Neutro Abs: 3 10*3/uL (ref 1.7–7.7)
Neutrophils Relative %: 52 % (ref 43–77)

## 2011-01-05 LAB — URINALYSIS, ROUTINE W REFLEX MICROSCOPIC
Hgb urine dipstick: NEGATIVE
Nitrite: NEGATIVE
Protein, ur: NEGATIVE mg/dL
Urobilinogen, UA: 0.2 mg/dL (ref 0.0–1.0)

## 2011-01-05 LAB — CBC
HCT: 49.7 % (ref 39.0–52.0)
Hemoglobin: 16.8 g/dL (ref 13.0–17.0)
MCV: 93.9 fL (ref 78.0–100.0)
WBC: 5.9 10*3/uL (ref 4.0–10.5)

## 2011-01-05 LAB — ETHANOL: Alcohol, Ethyl (B): 41 mg/dL — ABNORMAL HIGH (ref 0–10)

## 2011-01-05 LAB — LIPASE, BLOOD: Lipase: 44 U/L (ref 11–59)

## 2011-01-15 LAB — ETHANOL: Alcohol, Ethyl (B): 7 mg/dL (ref 0–10)

## 2011-01-15 LAB — URINALYSIS, ROUTINE W REFLEX MICROSCOPIC
Glucose, UA: NEGATIVE mg/dL
Hgb urine dipstick: NEGATIVE
Ketones, ur: NEGATIVE mg/dL
Protein, ur: NEGATIVE mg/dL
Urobilinogen, UA: 0.2 mg/dL (ref 0.0–1.0)

## 2011-01-15 LAB — RAPID URINE DRUG SCREEN, HOSP PERFORMED
Amphetamines: NOT DETECTED
Benzodiazepines: NOT DETECTED

## 2011-01-15 LAB — COMPREHENSIVE METABOLIC PANEL
ALT: 31 U/L (ref 0–53)
AST: 38 U/L — ABNORMAL HIGH (ref 0–37)
Albumin: 4.3 g/dL (ref 3.5–5.2)
Alkaline Phosphatase: 62 U/L (ref 39–117)
Chloride: 102 mEq/L (ref 96–112)
GFR calc Af Amer: >60 mL/min (ref >60)
Potassium: 4 mEq/L (ref 3.5–5.1)
Total Bilirubin: 0.2 mg/dL — ABNORMAL LOW (ref 0.3–1.2)

## 2011-01-15 LAB — CBC
Platelets: 168 10*3/uL (ref 150–400)
WBC: 6.6 10*3/uL (ref 4.0–10.5)

## 2011-01-15 LAB — DIFFERENTIAL
Basophils Absolute: 0 10*3/uL (ref 0.0–0.1)
Basophils Relative: 0 % (ref 0–1)
Eosinophils Relative: 4 % (ref 0–5)
Monocytes Absolute: 0.6 10*3/uL (ref 0.1–1.0)

## 2011-01-18 LAB — COMPREHENSIVE METABOLIC PANEL
ALT: 85 U/L — ABNORMAL HIGH (ref 0–53)
AST: 115 U/L — ABNORMAL HIGH (ref 0–37)
BUN: 7 mg/dL (ref 6–23)
CO2: 24 mEq/L (ref 19–32)
Calcium: 9.1 mg/dL (ref 8.4–10.5)
Calcium: 9 mg/dL (ref 8.4–10.5)
Chloride: 93 mEq/L — ABNORMAL LOW (ref 96–112)
Creatinine, Ser: 0.78 mg/dL (ref 0.4–1.5)
Creatinine, Ser: 0.57 mg/dL (ref 0.4–1.5)
GFR calc Af Amer: >60 mL/min (ref >60)
GFR calc non Af Amer: >60 mL/min (ref >60)
Glucose, Bld: 131 mg/dL — ABNORMAL HIGH (ref 70–99)
Glucose, Bld: 106 mg/dL — ABNORMAL HIGH (ref 70–99)
Sodium: 138 mEq/L (ref 135–145)
Total Bilirubin: 1.7 mg/dL — ABNORMAL HIGH (ref 0.3–1.2)
Total Protein: 6.8 g/dL (ref 6.0–8.3)

## 2011-01-18 LAB — LIPASE, BLOOD: Lipase: 24 U/L (ref 11–59)

## 2011-01-18 LAB — CBC
Hemoglobin: 14.4 g/dL (ref 13.0–17.0)
MCHC: 33.8 g/dL (ref 30.0–36.0)
Platelets: 220 10*3/uL (ref 150–400)
RDW: 14.3 % (ref 11.5–15.5)
RDW: 14.2 % (ref 11.5–15.5)

## 2011-01-18 LAB — BASIC METABOLIC PANEL
BUN: 4 mg/dL — ABNORMAL LOW (ref 6–23)
Calcium: 9.1 mg/dL (ref 8.4–10.5)
GFR calc non Af Amer: >60 mL/min (ref >60)
Glucose, Bld: 144 mg/dL — ABNORMAL HIGH (ref 70–99)
Potassium: 3.8 mEq/L (ref 3.5–5.1)

## 2011-01-18 LAB — DIFFERENTIAL
Basophils Absolute: 0 10*3/uL (ref 0.0–0.1)
Eosinophils Absolute: 0.2 10*3/uL (ref 0.0–0.7)
Eosinophils Relative: 3 % (ref 0–5)
Lymphocytes Relative: 26 % (ref 12–46)
Lymphs Abs: 1.5 10*3/uL (ref 0.7–4.0)
Neutrophils Relative %: 59 % (ref 43–77)

## 2011-01-18 LAB — TSH: TSH: 3.376 u[IU]/mL (ref 0.350–4.500)

## 2011-01-18 LAB — URINALYSIS, ROUTINE W REFLEX MICROSCOPIC
Nitrite: NEGATIVE
Protein, ur: NEGATIVE mg/dL

## 2011-01-18 LAB — RAPID URINE DRUG SCREEN, HOSP PERFORMED
Barbiturates: NOT DETECTED
Cocaine: NOT DETECTED
Opiates: NOT DETECTED

## 2011-01-19 LAB — CBC
Platelets: 148 10*3/uL — ABNORMAL LOW (ref 150–400)
Platelets: 171 10*3/uL (ref 150–400)
RDW: 14.9 % (ref 11.5–15.5)
RDW: 15.2 % (ref 11.5–15.5)
WBC: 6.5 10*3/uL (ref 4.0–10.5)

## 2011-01-19 LAB — RAPID URINE DRUG SCREEN, HOSP PERFORMED
Amphetamines: NOT DETECTED
Amphetamines: NOT DETECTED
Barbiturates: NOT DETECTED
Benzodiazepines: POSITIVE — AB
Benzodiazepines: POSITIVE — AB

## 2011-01-19 LAB — COMPREHENSIVE METABOLIC PANEL
ALT: 67 U/L — ABNORMAL HIGH (ref 0–53)
AST: 62 U/L — ABNORMAL HIGH (ref 0–37)
Albumin: 4 g/dL (ref 3.5–5.2)
Alkaline Phosphatase: 63 U/L (ref 39–117)
GFR calc Af Amer: >60 mL/min (ref >60)
Potassium: 3.6 mEq/L (ref 3.5–5.1)
Sodium: 133 mEq/L — ABNORMAL LOW (ref 135–145)
Total Protein: 7 g/dL (ref 6.0–8.3)

## 2011-01-19 LAB — URINALYSIS, ROUTINE W REFLEX MICROSCOPIC
Hgb urine dipstick: NEGATIVE
Protein, ur: NEGATIVE mg/dL
Urobilinogen, UA: 0.2 mg/dL (ref 0.0–1.0)

## 2011-01-19 LAB — DIFFERENTIAL
Eosinophils Relative: 2 % (ref 0–5)
Lymphocytes Relative: 23 % (ref 12–46)
Lymphs Abs: 1.5 10*3/uL (ref 0.7–4.0)
Monocytes Absolute: 0.7 10*3/uL (ref 0.1–1.0)

## 2011-01-19 LAB — BASIC METABOLIC PANEL
BUN: 3 mg/dL — ABNORMAL LOW (ref 6–23)
Calcium: 9.3 mg/dL (ref 8.4–10.5)
Creatinine, Ser: 0.73 mg/dL (ref 0.4–1.5)
GFR calc non Af Amer: >60 mL/min (ref >60)
Glucose, Bld: 108 mg/dL — ABNORMAL HIGH (ref 70–99)

## 2011-01-19 LAB — ETHANOL
Alcohol, Ethyl (B): <5 mg/dL (ref 0–10)
Alcohol, Ethyl (B): <5 mg/dL (ref 0–10)

## 2011-01-19 LAB — ACETAMINOPHEN LEVEL: Acetaminophen (Tylenol), Serum: <10 ug/mL — ABNORMAL LOW (ref 10–30)

## 2011-01-22 LAB — POCT I-STAT, CHEM 8
BUN: 5 mg/dL — ABNORMAL LOW (ref 6–23)
BUN: 6 mg/dL (ref 6–23)
Chloride: 107 mEq/L (ref 96–112)
Creatinine, Ser: 0.7 mg/dL (ref 0.4–1.5)
HCT: 48 % (ref 39.0–52.0)
Sodium: 134 mEq/L — ABNORMAL LOW (ref 135–145)
Sodium: 140 mEq/L (ref 135–145)
TCO2: 20 mmol/L (ref 0–100)

## 2011-01-22 LAB — POCT CARDIAC MARKERS
CKMB, poc: 1.2 ng/mL (ref 1.0–8.0)
Myoglobin, poc: 49.9 ng/mL (ref 12–200)
Troponin i, poc: <0.05 ng/mL (ref 0.00–0.09)

## 2011-01-22 LAB — URINALYSIS, ROUTINE W REFLEX MICROSCOPIC
Glucose, UA: NEGATIVE mg/dL
Nitrite: NEGATIVE
Protein, ur: NEGATIVE mg/dL
pH: 5.5 (ref 5.0–8.0)

## 2011-01-22 LAB — CBC
Platelets: 175 10*3/uL (ref 150–400)
RDW: 15.4 % (ref 11.5–15.5)

## 2011-01-22 LAB — DIFFERENTIAL
Basophils Absolute: 0.1 10*3/uL (ref 0.0–0.1)
Lymphocytes Relative: 32 % (ref 12–46)
Neutro Abs: 3.8 10*3/uL (ref 1.7–7.7)
Neutrophils Relative %: 53 % (ref 43–77)

## 2011-01-22 LAB — ETHANOL: Alcohol, Ethyl (B): 11 mg/dL — ABNORMAL HIGH (ref 0–10)

## 2011-01-22 LAB — D-DIMER, QUANTITATIVE: D-Dimer, Quant: 0.39 ug/mL-FEU (ref 0.00–0.48)

## 2011-01-23 LAB — COMPREHENSIVE METABOLIC PANEL
ALT: 24 U/L (ref 0–53)
AST: 23 U/L (ref 0–37)
Alkaline Phosphatase: 66 U/L (ref 39–117)
Calcium: 10.1 mg/dL (ref 8.4–10.5)
GFR calc Af Amer: >60 mL/min (ref >60)
Glucose, Bld: 117 mg/dL — ABNORMAL HIGH (ref 70–99)
Potassium: 3.4 mEq/L — ABNORMAL LOW (ref 3.5–5.1)
Sodium: 137 mEq/L (ref 135–145)
Total Protein: 7.2 g/dL (ref 6.0–8.3)

## 2011-01-23 LAB — URINALYSIS, ROUTINE W REFLEX MICROSCOPIC
Glucose, UA: NEGATIVE mg/dL
Hgb urine dipstick: NEGATIVE
Protein, ur: NEGATIVE mg/dL
Specific Gravity, Urine: 1.005 (ref 1.005–1.030)
pH: 5.5 (ref 5.0–8.0)

## 2011-01-23 LAB — CBC
Hemoglobin: 16.1 g/dL (ref 13.0–17.0)
RBC: 5.36 MIL/uL (ref 4.22–5.81)
RDW: 15.8 % — ABNORMAL HIGH (ref 11.5–15.5)

## 2011-01-23 LAB — PROTIME-INR: Prothrombin Time: 12.8 seconds (ref 11.6–15.2)

## 2011-01-23 LAB — DIFFERENTIAL
Basophils Relative: 1 % (ref 0–1)
Eosinophils Absolute: 0.3 10*3/uL (ref 0.0–0.7)
Eosinophils Relative: 4 % (ref 0–5)
Lymphs Abs: 1.9 10*3/uL (ref 0.7–4.0)
Monocytes Absolute: 0.8 10*3/uL (ref 0.1–1.0)
Monocytes Relative: 9 % (ref 3–12)
Neutrophils Relative %: 65 % (ref 43–77)

## 2011-01-23 LAB — HEMOCCULT GUIAC POC 1CARD (OFFICE): Fecal Occult Bld: NEGATIVE

## 2011-01-27 LAB — POCT I-STAT, CHEM 8
Creatinine, Ser: 0.8 mg/dL (ref 0.4–1.5)
Hemoglobin: 16 g/dL (ref 13.0–17.0)
Sodium: 138 mEq/L (ref 135–145)
TCO2: 20 mmol/L (ref 0–100)

## 2011-01-27 LAB — CBC
MCHC: 32.7 g/dL (ref 30.0–36.0)
MCV: 90 fL (ref 78.0–100.0)
Platelets: 220 10*3/uL (ref 150–400)
RDW: 14.8 % (ref 11.5–15.5)

## 2011-01-27 LAB — URINALYSIS, ROUTINE W REFLEX MICROSCOPIC
Hgb urine dipstick: NEGATIVE
Nitrite: NEGATIVE
Protein, ur: NEGATIVE mg/dL
Urobilinogen, UA: 0.2 mg/dL (ref 0.0–1.0)

## 2011-01-27 LAB — DIFFERENTIAL
Basophils Absolute: 0 10*3/uL (ref 0.0–0.1)
Basophils Relative: 1 % (ref 0–1)
Eosinophils Absolute: 0.3 10*3/uL (ref 0.0–0.7)
Monocytes Relative: 6 % (ref 3–12)
Neutrophils Relative %: 62 % (ref 43–77)

## 2011-01-27 LAB — POCT CARDIAC MARKERS: Myoglobin, poc: 75.1 ng/mL (ref 12–200)
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
## 2011-02-25 NOTE — Assessment & Plan Note (Signed)
[facility]                            CARDIOLOGY OFFICE NOTE   SAHEED, CARRINGTON                      MRN:          [id number]  DATE:09/21/2008                            DOB:          May 16, 1946    PRIMARY CARE PHYSICIAN:  Tammy R. Collins Scotland, MD, [place].   HISTORY OF PRESENT ILLNESS:  This is a 65 year old with a history of  COPD, smoking, hypothyroidism, PTSD, and alcohol abuse, who presents to  the Cardiology Clinic for evaluation of abnormal EKG.  The patient  states that he was sent over by Dr. Alda Berthold office because his EKG  appears abnormal.  I did look at his EKG today and it does appear to  show prominent R-waves in V1 and V2, this is suggestive of either RVH or  an old posterior infarct.  I do not see definite evidence of an old  inferior infarction, so this maybe RVH.  The patient tells me that he  has not had any episodes of significant chest pain.  He does get pain  that feels like a pinprick and lasts only about a second.  This pain is  rare, happens once every couple of months, and it is not related to  exertion.  He has not had anything like this for about 3-4 months.  The  patient does have a history of COPD.  He does get short of breath when  he climbs up a flight of steps, on flat ground he says if he walks  slowly, he can go as far as he wants.  He has no orthopnea or PND.  He  does report that he feels like he does not have any energy lately.  The  patient does continue to smoke.  He also abuses alcohol, drinks about 7-  8 beers a night.   MEDICATIONS:  1. Crestor 40 mg daily.  2. Fish oil.  3. Paxil 20 mg daily.  4. Neurontin.  5. He is supposed to be taking Synthroid 25 mcg a day.  However, he is      not taking it.   PAST MEDICAL HISTORY:  1. PTSD.  The patient does have a history of suicidality and      Psychiatric hospitalization.  2. Alcohol abuse.  He drinks 7-8 beers a day.  3. History of traumatic  subarachnoid hemorrhage in 2007.  4. Gastroesophageal reflux disease.  5. History of MRSA pneumonia.  6. COPD.  The patient continues to smoke one-and-half packs per day.      He has done so for years.  7. History of hypothyroidism, status post thyroidectomy.  He is      currently not taking Synthroid.  8. Pruritus.  9. Obesity.  10.History of squamous cell carcinoma of the throat, status post      radiation and chemotherapy.   SOCIAL HISTORY:  The patient lives by himself.  He is not married.  He  has no children.  He is a [place] veteran.  He lives in [place] near  his mother and his brother.  He continues to smoke one-and-half packs a  day.  He has done so far many years.  He drinks 7-8 beers a day.  He has  done this for many years as well.  No illicit drugs.   FAMILY HISTORY:  There is no family history of premature coronary artery  disease.   REVIEW OF SYSTEMS:  Negative except as noted in the history of present  illness.   EKG is reviewed.  This shows normal sinus rhythm with a borderline first-  degree AV block.  There are prominent R-waves in V1-V2 suggestive of  either RVH or an old posterior myocardial infarction.  There is a left  axis deviation.  There are no actual Q-waves in the inferior leads;  however, the inferior leads are abnormal with an RSR prime pattern.  There is a left axis deviation.  I think this as mentioned is either  evidence of old inferoposterior MI or RVH.   PHYSICAL EXAMINATION:  VITAL SIGNS:  Blood pressure is 130/80, heart  rate is 71 and regular.  GENERAL:  This is an obese male in no apparent distress.  NEUROLOGICAL:  Alert and oriented x3.  Normal affect.  LUNGS:  Distant breath sounds bilaterally.  CARDIOVASCULAR:  Distant heart sounds.  Regular S1 and S2.  No S3.  No  S4.  No murmur.  There are 2+ posterior tibial pulses bilaterally.  There is no peripheral edema.  There is no carotid bruits.  ABDOMEN:  Obese, soft, and nontender.   No hepatosplenomegaly.  Normal  bowel sounds.  EXTREMITIES:  No clubbing or cyanosis.  HEENT:  Normal exam.  SKIN:  Normal exam.  MUSCULOSKELETAL:  Normal exam.   ASSESSMENT AND PLAN:  This is a 64 year old with history of smoking,  chronic obstructive pulmonary disease, obesity, hypothyroidism, alcohol  abuse as well as posttraumatic stress disorder, who presents to  Cardiology Clinic for evaluation of abnormal EKG.  1. Abnormal EKG.  As mentioned above, the patient's EKG looks like      either right ventricular hypertrophy or a possible inferoposterior      old myocardial infarction.  Right ventricular hypertrophy is a      possibility, given his history of chronic obstructive pulmonary      disease, since he could develop pulmonary hypertension and some      degree of cor pulmonale in the setting of the chronic obstructive      pulmonary disease.  I think inferior posterior myocardial      infarction is possible, given his risk factors though he has really      never had any suggestive episodes of chest pain.  I will plan on      getting an echocardiogram to evaluate his regional wall motion and      to evaluate his right ventricle and to assess his pulmonary artery      systolic pressure.  2. Coronary artery disease risk.  The patient has only very atypical      chest pain that feels like a pinprick and it last only for few      seconds.  He has nothing that sounds like ischemia.  I will have      him to start aspirin 81 mg a day.  He will need to continue his      Crestor.  We will check his lipids and see if he needs any      adjustment to his statin regimen.  3. Chronic obstructive pulmonary disease.  We will try to get the  patient's PFT records from the [place].  4. Hypothyroidism.  The patient has a history of hypothyroidism.  He      feels fatigued.  He is not taking his Synthroid.  We will check a      free T4 and TSH.  5. History of smoking.  The patient has nicotine  patches at home.  I      told him that he needs to use them.     Marca Ancona, MD  Electronically Signed    DM/MedQ  DD: 09/21/2008  DT: 09/21/2008  Job #: [id number]   cc:   Tammy R. Collins Scotland, M.D.
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
## 2011-02-25 NOTE — H&P (Signed)
NAME:  David Lucero, PIET NO.:  [id number]   MEDICAL RECORD NO.:  [id number]          PATIENT TYPE:  IPS   LOCATION:  0508                          FACILITY:  [facility]   PHYSICIAN:  Geoffery Lyons, M.D.      DATE OF BIRTH:  [birth year]   DATE OF ADMISSION:  05/28/2009  DATE OF DISCHARGE:                       PSYCHIATRIC ADMISSION ASSESSMENT   This is on a 65 year old male voluntarily admitted on May 28, 2009.   HISTORY OF PRESENT ILLNESS:  The patient presents after being assessed  in the emergency department where patient presented feeling very  jittery, felt that he was going to have a nervous breakdown, has been  off his medications, running out his benzodiazepines.  He has been  reporting increased anxiety, trouble sleeping for the past several  nights, and a history of nightmares.  Also drinking, relapsed about 5  months ago, drinking as he states too much, drinking has been  escalating.  He has lost about 15 to 20 pounds.  Denies any suicidal  thoughts.  Feels as if hairs are standing on end   PAST PSYCHIATRIC HISTORY:  Patient was here in January 2010.  Has a  history of PTSD and states he is 100% service related.  He sees a  Therapist, sports in [place], Dr. Saddie Benders.   SOCIAL HISTORY:  Patient lives alone in [place].  He is single.  He  is unemployed.  Again, he states he is 100% disability.   FAMILY HISTORY:  None.   ALCOHOL AND DRUG HISTORY:  Patient smokes.  Again, has been drinking  with drinking escalating.  Denies any seizure activity.  Last drink was  day of admission.   PRIMARY CARE Michail Boyte:  The [place] in [place].   MEDICAL PROBLEMS:  1. History of throat cancer.  2. Asthma.   MEDICATIONS:  Listed are:  1. Paxil 20 mg b.i.d.  2. Allopurinol 100 mg daily.  3. Crestor 10 mg daily.  4. Fish oil over the counter.  5. Hydroxyzine 50 mg 1/2 every 6 hours p.r.n. itching.  6. Neurontin 300 mg t.i.d. p.r.n.  7. Lorazepam 0.5 mg daily p.r.n. for  anxiety.  8. Trazodone 50 mg at bedtime for sleep.  9. Xanax 1 mg q.h.s. p.r.n. sleep.  10.Albuterol inhaler 2 puffs every 4 to 6 hours as needed.  11.Pulmicort inhaler 2 puffs inhalation b.i.d.  12.Prilosec 20 mg daily.   Patient does not feel that his Paxil is effective at this time.  Again,  he has been having difficulty sleeping, taking up to 2 trazodone at  night for sleep.   DRUG ALLERGIES:  NO KNOWN ALLERGIES.   PHYSICAL EXAM:  This appears to be a disheveled, malnourished, middle-  aged male.  He was fully assessed at [facility] Emergency Department.  Physical exam was reviewed with no significant findings.  Of note, does  state that patient was spitting often during the emergency room,  spitting on walls and floors, is not noncompliant with instructions to  expectorate into a basin.  He also had some difficulty voiding.  Also  noted in the ER records that it was noted  that the patient's brother had  called during the patient's stay and reported that the patient had some  type of parasitic infection that the patient has not had assessed, was  to go to [facility] for again further assessment of such.   LABORATORY DATA:  Shows urinalysis that is negative.  Urine drug screen  is negative.  Alcohol level less than 5.  His CMP shows a sodium of 128,  potassium of 4.5, AST is elevated at 115 with a reference range of 0 to  37, ALT is 85 with a reference range of 0 to 53.  CBC shows a hemoglobin  of 17.1, hematocrit of 52.2, platelet count within normal limits at 220.   MENTAL STATUS EXAM:  Patient is fully alert and cooperative, currently  dressed in hospital scrubs.  He has good eye contact.  His speech is  normal pace and tone.  Patient's mood is neutral.  Again, patient is  calm and agreeable to recommendations at this time.  Thought process, he  denies any suicidal or homicidal thoughts.  Realizes that he needs to  stop drinking.  He states he is getting too old, that  is going to kill  him.  Cognitive function is intact.  His memory appears intact.  Poor  impulse control related to alcohol use.   AXIS I:  Alcohol dependence.  AXIS II:  Deferred.  AXIS III:  1. History of throat cancer.  2. Asthma.  AXIS IV:  Medical problems, possible other psychosocial problems related  to chronic alcohol use.  AXIS V:  Current is 45.   PLAN:  Detox patient with the Librium protocol.  Work on relapse  prevention.  Patient will be in the [facility].  We will continue to  assess comorbidities and identify his support group.  Patient may  benefit from a long-term  rehab.  We will also order Ensure throughout the day to aid with  appetite and chloric intake.  [facility] and outpatient mental health services.   TENTATIVE LENGTH OF STAY:  At this time, is 3 to 5 days.      Landry Corporal, N.P.      Geoffery Lyons, M.D.  Electronically Signed    JO/MEDQ  D:  05/28/2009  T:  05/28/2009  Job:  [id number]

## 2011-02-28 NOTE — Op Note (Signed)
NAME:  David Lucero, SOOKDEO NO.:  [id number]   MEDICAL RECORD NO.:  [id number]          PATIENT TYPE:  INP   LOCATION:  3111                         FACILITY:  [facility]   PHYSICIAN:  Ollen Gross. Vernell Morgans, M.D. DATE OF BIRTH:  13-Oct-1946   DATE OF PROCEDURE:  02/08/2006  DATE OF DISCHARGE:                                 OPERATIVE REPORT   PRE AND POSTOPERATIVE DIAGNOSIS:  Right pneumothorax.   PROCEDURE:  Placement of right chest tube.   SURGEON:  Dr. Carolynne Edouard.   ANESTHESIA:  Local.   PROCEDURE:  After informed consent was obtained, the patient's right chest  was prepped with Betadine and draped in the usual sterile manner.  An area  on the right chest was infiltrated with 1% lidocaine to create a good field  block.  A small transverse incision was made just below the rib at the level  of the nipple in the mid axillary line.  Blunt dissection was carried out on  the subcutaneous tissue and then over top of the rib.  The hemostat was used  to bluntly access the thoracic cavity.  Air under pressure was released. A  28-French chest tube was then placed into the pleural space without  difficulty and placed to suction.  The tube was anchored to the skin of the  chest wall with a 0 silk stitch.  The patient tolerated well.  Sterile  dressings were applied.  Chest x-ray is pending.      Ollen Gross. Vernell Morgans, M.D.  Electronically Signed     PST/MEDQ  D:  02/08/2006  T:  02/09/2006  Job:  [id number]
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
## 2011-07-10 LAB — CBC
HCT: 43.4
MCHC: 33.6
MCV: 89.9
Platelets: 184
Platelets: 185
RBC: 5.26
RDW: 15.4
RDW: 15.7 — ABNORMAL HIGH
RDW: 15.4
WBC: 7.3
WBC: 8.2

## 2011-07-10 LAB — RAPID URINE DRUG SCREEN, HOSP PERFORMED
Amphetamines: NOT DETECTED
Benzodiazepines: POSITIVE — AB
Cocaine: NOT DETECTED
Cocaine: NOT DETECTED
Opiates: NOT DETECTED
Opiates: NOT DETECTED
Opiates: NOT DETECTED
Tetrahydrocannabinol: NOT DETECTED
Tetrahydrocannabinol: NOT DETECTED
Tetrahydrocannabinol: NOT DETECTED

## 2011-07-10 LAB — DIFFERENTIAL
Basophils Absolute: 0.1
Eosinophils Absolute: 0.2
Eosinophils Absolute: 0.2
Eosinophils Absolute: 0.2
Eosinophils Relative: 3
Eosinophils Relative: 3
Eosinophils Relative: 3
Lymphocytes Relative: 26
Lymphs Abs: 2.2
Lymphs Abs: 2.6
Lymphs Abs: 2.2
Monocytes Relative: 12
Neutrophils Relative %: 61

## 2011-07-10 LAB — COMPREHENSIVE METABOLIC PANEL
ALT: 34
ALT: 26
AST: 31
AST: 32
Albumin: 4.5
Albumin: 4.1
CO2: 20
Calcium: 9.4
Calcium: 9.4
GFR calc Af Amer: >60
GFR calc Af Amer: >60
Sodium: 142
Sodium: 135
Total Protein: 7.8
Total Protein: 7.1

## 2011-07-10 LAB — URINALYSIS, ROUTINE W REFLEX MICROSCOPIC
Bilirubin Urine: NEGATIVE
Glucose, UA: NEGATIVE
Hgb urine dipstick: NEGATIVE
Ketones, ur: NEGATIVE
Specific Gravity, Urine: 1.017
pH: 5

## 2011-07-10 LAB — POCT I-STAT, CHEM 8
Calcium, Ion: 1.16
Creatinine, Ser: 1.1
Hemoglobin: 16.3
Sodium: 140
TCO2: 26

## 2011-07-10 LAB — ETHANOL
Alcohol, Ethyl (B): 9
Alcohol, Ethyl (B): <5

## 2011-07-11 ENCOUNTER — Emergency Department (HOSPITAL_COMMUNITY)
Admission: EM | Admit: 2011-07-11 | Discharge: 2011-07-11 | Disposition: A | Discharge status: Other Acute Inpatient Hospital | Payer: Medicare Other | Source: Home / Self Care | Attending: Emergency Medicine | Admitting: Emergency Medicine

## 2011-07-11 ENCOUNTER — Inpatient Hospital Stay (HOSPITAL_COMMUNITY)
Admission: AD | Admit: 2011-07-11 | Discharge: 2011-07-17 | DRG: 897 | Disposition: A | Discharge status: Home / Self Care | Payer: Medicare Other | Source: Ambulatory Visit | Attending: Psychiatry | Admitting: Psychiatry

## 2011-07-11 DIAGNOSIS — F1010 Alcohol abuse, uncomplicated: Secondary | ICD-10-CM | POA: Insufficient documentation

## 2011-07-11 DIAGNOSIS — J4489 Other specified chronic obstructive pulmonary disease: Secondary | ICD-10-CM

## 2011-07-11 DIAGNOSIS — H8309 Labyrinthitis, unspecified ear: Secondary | ICD-10-CM

## 2011-07-11 DIAGNOSIS — K219 Gastro-esophageal reflux disease without esophagitis: Secondary | ICD-10-CM

## 2011-07-11 DIAGNOSIS — Z6379 Other stressful life events affecting family and household: Secondary | ICD-10-CM

## 2011-07-11 DIAGNOSIS — F4310 Post-traumatic stress disorder, unspecified: Secondary | ICD-10-CM | POA: Insufficient documentation

## 2011-07-11 DIAGNOSIS — M129 Arthropathy, unspecified: Secondary | ICD-10-CM

## 2011-07-11 DIAGNOSIS — F17200 Nicotine dependence, unspecified, uncomplicated: Secondary | ICD-10-CM

## 2011-07-11 DIAGNOSIS — J449 Chronic obstructive pulmonary disease, unspecified: Secondary | ICD-10-CM

## 2011-07-11 DIAGNOSIS — F1020 Alcohol dependence, uncomplicated: Principal | ICD-10-CM

## 2011-07-11 DIAGNOSIS — Z8521 Personal history of malignant neoplasm of larynx: Secondary | ICD-10-CM

## 2011-07-11 DIAGNOSIS — Z8614 Personal history of Methicillin resistant Staphylococcus aureus infection: Secondary | ICD-10-CM

## 2011-07-11 DIAGNOSIS — Z87820 Personal history of traumatic brain injury: Secondary | ICD-10-CM

## 2011-07-11 LAB — RAPID URINE DRUG SCREEN, HOSP PERFORMED
Amphetamines: NOT DETECTED
Barbiturates: NOT DETECTED
Benzodiazepines: NOT DETECTED
Cocaine: NOT DETECTED
Opiates: NOT DETECTED
Tetrahydrocannabinol: NOT DETECTED
Tetrahydrocannabinol: NOT DETECTED

## 2011-07-11 LAB — URINALYSIS, ROUTINE W REFLEX MICROSCOPIC
Bilirubin Urine: NEGATIVE
Hgb urine dipstick: NEGATIVE
Ketones, ur: NEGATIVE
Protein, ur: NEGATIVE
Specific Gravity, Urine: 1.017
Urobilinogen, UA: 0.2

## 2011-07-11 LAB — COMPREHENSIVE METABOLIC PANEL
Albumin: 4 g/dL (ref 3.5–5.2)
Alkaline Phosphatase: 54 U/L (ref 39–117)
BUN: 7 mg/dL (ref 6–23)
Calcium: 9.3 mg/dL (ref 8.4–10.5)
Potassium: 4.2 mEq/L (ref 3.5–5.1)
Sodium: 132 mEq/L — ABNORMAL LOW (ref 135–145)
Total Protein: 7.8 g/dL (ref 6.0–8.3)

## 2011-07-11 LAB — POCT I-STAT, CHEM 8
BUN: 12
Calcium, Ion: 1.14
Creatinine, Ser: 0.8
Creatinine, Ser: 1
Creatinine, Ser: 1
Glucose, Bld: 104 — ABNORMAL HIGH
Hemoglobin: 16.3
Hemoglobin: 17
Potassium: 3.8
Potassium: 4.6
Sodium: 138
Sodium: 139
TCO2: 22
TCO2: 22

## 2011-07-11 LAB — DIFFERENTIAL
Basophils Absolute: 0.2 — ABNORMAL HIGH
Basophils Absolute: 0.1 10*3/uL (ref 0.0–0.1)
Basophils Relative: 1
Basophils Relative: 2 — ABNORMAL HIGH
Eosinophils Absolute: 0.2
Eosinophils Absolute: 0.3
Lymphs Abs: 1.9
Lymphs Abs: 2 10*3/uL (ref 0.7–4.0)
Monocytes Absolute: 0.8 10*3/uL (ref 0.1–1.0)
Neutro Abs: 6.1
Neutrophils Relative %: 60
Neutrophils Relative %: 68

## 2011-07-11 LAB — HEPATIC FUNCTION PANEL
Albumin: 3.9
Total Protein: 6.7

## 2011-07-11 LAB — POCT CARDIAC MARKERS
CKMB, poc: 1.4
Myoglobin, poc: 71.2
Myoglobin, poc: 74.8
Operator id: 192351
Operator id: 294341

## 2011-07-11 LAB — CBC
HCT: 45.8 % (ref 39.0–52.0)
MCHC: 34.5
MCHC: 33.2 g/dL (ref 30.0–36.0)
MCV: 90.3
MCV: 90.5
Platelets: 196
Platelets: 204
RBC: 5.08
RDW: 15.3
RDW: 15.4 % (ref 11.5–15.5)
WBC: 10.6 — ABNORMAL HIGH

## 2011-07-11 LAB — ETHANOL: Alcohol, Ethyl (B): <11 mg/dL (ref 0–11)

## 2011-07-11 LAB — TRICYCLICS SCREEN, URINE: TCA Scrn: NOT DETECTED

## 2011-07-12 DIAGNOSIS — F1020 Alcohol dependence, uncomplicated: Secondary | ICD-10-CM

## 2011-07-14 DIAGNOSIS — F1020 Alcohol dependence, uncomplicated: Secondary | ICD-10-CM

## 2011-07-14 LAB — T4, FREE: Free T4: 1.1

## 2011-07-14 LAB — TSH: TSH: 4.427

## 2011-07-15 LAB — CBC
HCT: 45.8
Hemoglobin: 15.2
RBC: 5.07
RDW: 14.6
WBC: 8.8

## 2011-07-15 LAB — DIFFERENTIAL
Eosinophils Relative: 7 — ABNORMAL HIGH
Lymphocytes Relative: 26
Lymphs Abs: 2.2
Monocytes Absolute: 0.5
Monocytes Relative: 6
Neutro Abs: 5.3

## 2011-07-15 LAB — POCT I-STAT, CHEM 8
BUN: 7
Calcium, Ion: 1.14
Chloride: 109
Creatinine, Ser: 1
TCO2: 23

## 2011-07-15 LAB — ETHANOL: Alcohol, Ethyl (B): 65 — ABNORMAL HIGH

## 2011-07-15 LAB — RAPID URINE DRUG SCREEN, HOSP PERFORMED
Barbiturates: NOT DETECTED
Benzodiazepines: NOT DETECTED

## 2011-07-18 LAB — RAPID URINE DRUG SCREEN, HOSP PERFORMED
Amphetamines: NOT DETECTED
Barbiturates: NOT DETECTED
Benzodiazepines: POSITIVE — AB
Cocaine: NOT DETECTED

## 2011-07-18 LAB — CBC
HCT: 46.9 % (ref 39.0–52.0)
MCV: 91.2 fL (ref 78.0–100.0)
Platelets: 161 10*3/uL (ref 150–400)
RDW: 15 % (ref 11.5–15.5)
WBC: 8.4 10*3/uL (ref 4.0–10.5)

## 2011-07-18 LAB — DIFFERENTIAL
Basophils Absolute: 0 10*3/uL (ref 0.0–0.1)
Basophils Relative: 0 % (ref 0–1)
Eosinophils Absolute: 0.3 10*3/uL (ref 0.0–0.7)
Eosinophils Relative: 3 % (ref 0–5)

## 2011-07-18 LAB — HEPATIC FUNCTION PANEL
AST: 19 U/L (ref 0–37)
Albumin: 3.6 g/dL (ref 3.5–5.2)
Alkaline Phosphatase: 69 U/L (ref 39–117)
Total Bilirubin: 0.4 mg/dL (ref 0.3–1.2)
Total Protein: 6.5 g/dL (ref 6.0–8.3)

## 2011-07-18 LAB — COMPREHENSIVE METABOLIC PANEL
AST: 21 U/L (ref 0–37)
Albumin: 3.8 g/dL (ref 3.5–5.2)
BUN: 9 mg/dL (ref 6–23)
Chloride: 105 mEq/L (ref 96–112)
Creatinine, Ser: 1.03 mg/dL (ref 0.4–1.5)
GFR calc Af Amer: >60 mL/min (ref >60)
Total Bilirubin: 0.8 mg/dL (ref 0.3–1.2)
Total Protein: 6.7 g/dL (ref 6.0–8.3)

## 2011-07-18 LAB — TSH: TSH: 5.052 u[IU]/mL — ABNORMAL HIGH (ref 0.350–4.500)

## 2011-07-18 LAB — ETHANOL: Alcohol, Ethyl (B): <5 mg/dL (ref 0–10)
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
## 2011-08-16 ENCOUNTER — Emergency Department (HOSPITAL_COMMUNITY)
Admission: EM | Admit: 2011-08-16 | Discharge: 2011-08-16 | Disposition: A | Discharge status: Home / Self Care | Payer: Self-pay | Attending: Emergency Medicine | Admitting: Emergency Medicine

## 2011-08-16 DIAGNOSIS — E7800 Pure hypercholesterolemia, unspecified: Secondary | ICD-10-CM | POA: Insufficient documentation

## 2011-08-16 DIAGNOSIS — F411 Generalized anxiety disorder: Secondary | ICD-10-CM | POA: Insufficient documentation

## 2011-08-16 DIAGNOSIS — I10 Essential (primary) hypertension: Secondary | ICD-10-CM | POA: Insufficient documentation

## 2011-08-16 DIAGNOSIS — K219 Gastro-esophageal reflux disease without esophagitis: Secondary | ICD-10-CM | POA: Insufficient documentation

## 2011-08-16 DIAGNOSIS — Z85819 Personal history of malignant neoplasm of unspecified site of lip, oral cavity, and pharynx: Secondary | ICD-10-CM | POA: Insufficient documentation

## 2011-08-16 DIAGNOSIS — R209 Unspecified disturbances of skin sensation: Secondary | ICD-10-CM | POA: Insufficient documentation

## 2011-08-16 DIAGNOSIS — Z79899 Other long term (current) drug therapy: Secondary | ICD-10-CM | POA: Insufficient documentation

## 2011-10-02 ENCOUNTER — Emergency Department (HOSPITAL_COMMUNITY)
Admission: EM | Admit: 2011-10-02 | Discharge: 2011-10-02 | Disposition: A | Discharge status: Home / Self Care | Payer: Medicare Other

## 2011-10-14 ENCOUNTER — Emergency Department (HOSPITAL_COMMUNITY)
Admission: EM | Admit: 2011-10-14 | Discharge: 2011-10-14 | Disposition: A | Discharge status: Home / Self Care | Payer: Medicare Other | Attending: Emergency Medicine | Admitting: Emergency Medicine

## 2011-10-14 ENCOUNTER — Encounter: Payer: Self-pay | Admitting: *Deleted

## 2011-10-14 DIAGNOSIS — R450 Nervousness: Secondary | ICD-10-CM | POA: Insufficient documentation

## 2011-10-14 DIAGNOSIS — F1020 Alcohol dependence, uncomplicated: Secondary | ICD-10-CM | POA: Insufficient documentation

## 2011-10-14 DIAGNOSIS — F411 Generalized anxiety disorder: Secondary | ICD-10-CM | POA: Insufficient documentation

## 2011-10-14 DIAGNOSIS — F17200 Nicotine dependence, unspecified, uncomplicated: Secondary | ICD-10-CM | POA: Insufficient documentation

## 2011-10-14 DIAGNOSIS — F419 Anxiety disorder, unspecified: Secondary | ICD-10-CM

## 2011-10-14 DIAGNOSIS — Z79899 Other long term (current) drug therapy: Secondary | ICD-10-CM | POA: Insufficient documentation

## 2011-10-14 HISTORY — DX: Anxiety disorder, unspecified: F41.9

## 2011-10-14 MED ORDER — LORAZEPAM 1 MG PO TABS: 1.0000 mg | ORAL_TABLET | Freq: Four times a day (QID) | ORAL | Status: AC | PRN

## 2011-10-14 NOTE — ED Provider Notes (Signed)
History     CSN: [id number]  Arrival date & time 10/14/11  4098   First MD Initiated Contact with Patient 10/14/11 [phone]      Chief Complaint  Patient presents with  . Anxiety    (Consider location/radiation/quality/duration/timing/severity/associated sxs/prior treatment) HPI This 66 year old male presents to the emergency department requesting a prescription for Ativan. He states he feels anxious as if something is crawling on his skin all over but does not feel as if he has tremors and does not feel as if he is in withdrawal. He did not have any on-call last night but has not had any vomiting hallucinations or tremulousness. He has not hurt herself or others. He denies suicidal or homicidal ideation. He has no headache pain back pain chest pain confusion shortness of breath abdominal pain or localized weakness or numbness or incoordination. He drove himself here without difficulty today. He has had good outcome with Ativan when he has anxiety spells like this in the past.  He states he has been through alcohol detox multiple times in the past and does not want that at this time. He understands a prescription from the emergency department for Ativan will be for a very short term only for a few days until he can see his doctor. Past Medical History  Diagnosis Date  . Anxiety     History reviewed. No pertinent past surgical history.  No family history on file.  History  Substance Use Topics  . Smoking status: Current Everyday Smoker -- 30 years    Types: Cigarettes  . Smokeless tobacco: Not on file  . Alcohol Use: Not on file      Review of Systems  Constitutional: Negative for fever.       10 Systems reviewed and are negative for acute change except as noted in the HPI.  HENT: Negative for congestion.   Eyes: Negative for discharge and redness.  Respiratory: Negative for cough and shortness of breath.   Cardiovascular: Negative for chest pain.  Gastrointestinal: Negative for  vomiting and abdominal pain.  Musculoskeletal: Negative for back pain.  Skin: Negative for rash.  Neurological: Negative for syncope, numbness and headaches.  Psychiatric/Behavioral: Negative for suicidal ideas, hallucinations and self-injury. The patient is nervous/anxious.        No behavior change.    Allergies  Review of patient's allergies indicates no known allergies.  Home Medications   Current Outpatient Rx  Name Route Sig Dispense Refill  . ACETAMINOPHEN 500 MG PO TABS Oral Take 500 mg by mouth 2 (two) times daily.      Marland Kitchen CALCIUM CARBONATE ANTACID 500 MG PO CHEW Oral Chew 1 tablet by mouth as needed. For heartburn      . UNISOM PO Oral Take 1 tablet by mouth at bedtime.      . TRAZODONE HCL 100 MG PO TABS Oral Take 100 mg by mouth at bedtime.      Marland Kitchen LORAZEPAM 1 MG PO TABS Oral Take 1 tablet (1 mg total) by mouth every 6 (six) hours as needed for anxiety. 10 tablet 0    BP 126/89  Pulse 92  Temp(Src) 97.7 F (36.5 C) (Oral)  Resp 16  SpO2 99%  Physical Exam  Nursing note and vitals reviewed. Constitutional:       Awake, alert, nontoxic appearance with baseline speech for patient.  HENT:  Head: Atraumatic.  Mouth/Throat: No oropharyngeal exudate.  Eyes: EOM are normal. Pupils are equal, round, and reactive to light. Right eye  exhibits no discharge. Left eye exhibits no discharge.  Neck: Neck supple.  Cardiovascular: Normal rate and regular rhythm.   No murmur heard. Pulmonary/Chest: Effort normal and breath sounds normal. No stridor. No respiratory distress. He has no wheezes. He has no rales. He exhibits no tenderness.  Abdominal: Soft. Bowel sounds are normal. He exhibits no mass. There is no tenderness. There is no rebound.  Musculoskeletal: He exhibits no tenderness.       Baseline ROM, moves extremities with no obvious new focal weakness.  Lymphadenopathy:    He has no cervical adenopathy.  Neurological: He is alert.       Awake, alert, cooperative and  aware of situation; motor strength bilaterally; sensation normal to light touch bilaterally; peripheral visual fields full to confrontation; no facial asymmetry; tongue midline; major cranial nerves appear intact; no pronator drift, normal finger to nose bilaterally, baseline gait without new ataxia.  Skin: No rash noted.  Psychiatric:       Anxious    ED Course  Procedures (including critical care time)  Labs Reviewed - No data to display No results found.   1. Anxiety   2. Chronic alcoholism       MDM  Patient informed of clinical course, understand medical decision-making process, and agree with plan.        Hurman Horn, MD 10/14/11 [phone]

## 2011-10-14 NOTE — ED Notes (Signed)
Pt states he feels as if something is crawling on him, states "my nerves" has had symptoms before and was treated with Ativan.

## 2012-03-27 ENCOUNTER — Encounter (HOSPITAL_COMMUNITY): Payer: Self-pay

## 2012-03-27 ENCOUNTER — Emergency Department (HOSPITAL_COMMUNITY)
Admission: EM | Admit: 2012-03-27 | Discharge: 2012-03-27 | Disposition: A | Discharge status: Home / Self Care | Payer: Medicare Other | Attending: Emergency Medicine | Admitting: Emergency Medicine

## 2012-03-27 DIAGNOSIS — F419 Anxiety disorder, unspecified: Secondary | ICD-10-CM

## 2012-03-27 DIAGNOSIS — F411 Generalized anxiety disorder: Secondary | ICD-10-CM | POA: Insufficient documentation

## 2012-03-27 DIAGNOSIS — F17200 Nicotine dependence, unspecified, uncomplicated: Secondary | ICD-10-CM | POA: Insufficient documentation

## 2012-03-27 HISTORY — DX: Malignant (primary) neoplasm, unspecified: C80.1

## 2012-03-27 MED ORDER — LORAZEPAM 0.5 MG PO TABS: 0.5000 mg | ORAL_TABLET | Freq: Three times a day (TID) | ORAL | Status: AC | PRN

## 2012-03-27 NOTE — ED Provider Notes (Signed)
History     CSN: [id number]  Arrival date & time 03/27/12  1057   First MD Initiated Contact with Patient 03/27/12 1105      Chief Complaint  Patient presents with  . Anxiety    (Consider location/radiation/quality/duration/timing/severity/associated sxs/prior treatment) HPI Comments: Patient presents to the emergency department with complaint of anxiety. Patient has a history of anxiety disorder for which he takes Clonopin. He states that this is no longer helping his anxiety. He last took this yesterday. Patient is an alcoholic. He states he drinks 8 or 9 beers a day. He has not decreased his intake or stop drinking. He denies other medical complaints other than a sore throat. Patient has history of throat cancer. He follows up with his primary care physician for this. Patient denies other drugs. Patient denies suicidal or homicidal ideation. Nothing makes the patient's symptoms better or worse.  Patient is a 66 y.o. male presenting with anxiety. The history is provided by the patient.  Anxiety This is a chronic problem. The current episode started more than 1 year ago. The problem occurs intermittently. The problem has been unchanged. Associated symptoms include a sore throat. Pertinent negatives include no abdominal pain, chest pain, fever, headaches, myalgias, nausea, visual change or vomiting. Nothing aggravates the symptoms. The treatment provided mild relief.    Past Medical History  Diagnosis Date  . Anxiety   . Cancer     History reviewed. No pertinent past surgical history.  No family history on file.  History  Substance Use Topics  . Smoking status: Current Everyday Smoker -- 30 years    Types: Cigarettes  . Smokeless tobacco: Not on file  . Alcohol Use: No      Review of Systems  Constitutional: Negative for fever.  HENT: Positive for sore throat.   Respiratory: Negative for shortness of breath.   Cardiovascular: Negative for chest pain and palpitations.    Gastrointestinal: Negative for nausea, vomiting, abdominal pain and diarrhea.  Musculoskeletal: Negative for myalgias.  Neurological: Negative for headaches.  Psychiatric/Behavioral: Negative for suicidal ideas. The patient is nervous/anxious.     Allergies  Review of patient's allergies indicates no known allergies.  Home Medications   Current Outpatient Rx  Name Route Sig Dispense Refill  . ACETAMINOPHEN 500 MG PO TABS Oral Take 500 mg by mouth every 4 (four) hours as needed. For pain    . CALCIUM CARBONATE ANTACID 500 MG PO CHEW Oral Chew 1 tablet by mouth as needed. For heartburn      . UNISOM PO Oral Take 1 tablet by mouth at bedtime.      . TRAZODONE HCL 100 MG PO TABS Oral Take 100 mg by mouth at bedtime.      Marland Kitchen LORAZEPAM 0.5 MG PO TABS Oral Take 1 tablet (0.5 mg total) by mouth 3 (three) times daily as needed for anxiety (Do not combine with alcohol or clonazepam). 10 tablet 0    BP 138/78  Resp 18  SpO2 100%  Physical Exam  Nursing note and vitals reviewed. Constitutional: He appears well-developed and well-nourished.  HENT:  Head: Normocephalic and atraumatic. No trismus in the jaw.  Mouth/Throat: Uvula is midline, oropharynx is clear and moist and mucous membranes are normal. Mucous membranes are not dry. No oral lesions. Dental caries present. No dental abscesses or uvula swelling.  Eyes: Conjunctivae are normal. Right eye exhibits no discharge. Left eye exhibits no discharge.  Neck: Normal range of motion. Neck supple.  Cardiovascular: Normal rate,  regular rhythm and normal heart sounds.   Pulmonary/Chest: Effort normal and breath sounds normal.  Abdominal: Soft. There is no tenderness.  Neurological: He is alert.  Skin: Skin is warm and dry.  Psychiatric: His speech is normal and behavior is normal. His mood appears anxious. His affect is not angry and not inappropriate. Thought content is not paranoid and not delusional. He does not express inappropriate  judgment. He does not exhibit a depressed mood. He expresses no homicidal and no suicidal ideation.    ED Course  Procedures (including critical care time)  Labs Reviewed - No data to display No results found.   1. Anxiety    12:02 PM Patient seen and examined. Will not give medication here because patient is driving. Urged to follow-up with PCP to discuss other anti-anxiety medications and treatment strategies.   Vital signs reviewed and are as follows: Filed Vitals:   03/27/12 1103  BP: 138/78  Resp: 18   Patient counseled on use of benzodiazepine medications. Counseled not to combine these with alcohol and that he should not combine the prescribed medication with the clonazepam he is prescribed by his PCP. Urged not to drink alcohol, drive, or perform any other activities that requires focus while taking these medications. The patient verbalizes understanding and agrees with the plan.  MDM  Anxiety. Patient explicitly states no SI/HI. History sounds like he is becoming tolerant to clonazepam. He might benefit from PCP follow-up and anti-depressant therapy. He expresses that he will follow-up with PCP and seems trustworthy. Will prescribe small amount of ativan. Clear instructions given to patient regarding use and dangers of this medication including using with alcohol or other benzos.         Renne Crigler, [place] 03/27/12 1213

## 2012-03-27 NOTE — Discharge Instructions (Signed)
Please read and follow all provided instructions.  Your diagnoses today include:  1. Anxiety     Tests performed today include:  Vital signs. See below for your results today.   Medications prescribed:   Ativan - medication for anxiety DO NOT take with alcohol or with clonazepam  Home care instructions:  Follow any educational materials contained in this packet.  DO NOT take prescribed medication with alcohol or with clonazepam  Follow-up instructions: Please follow-up with your primary care provider in the next 3 days for further evaluation of your symptoms. There are other medications for anxiety that you can discuss with your doctor.    If you do not have a primary care doctor -- see below for referral information.   Return instructions:   Please return to the Emergency Department if you experience worsening symptoms.   Return if you have thought of hurting yourself.  Please return if you have any other emergent concerns.  Additional Information:  Your vital signs today were: BP 138/78  Resp 18  SpO2 100% If your blood pressure (BP) was elevated above 135/85 this visit, please have this repeated by your doctor within one month. -------------- No Primary Care Doctor Call [facility]  [phone] Other agencies that provide inexpensive medical care    [facility]  [phone]    [facility]  [phone]    [facility]  [phone]    [facility]  [phone]    Planned Parenthood  [phone]    [facility]  [phone] -------------- RESOURCE GUIDE:  Dental Problems  Patients with Medicaid: [facility] [phone] [address]                                            [phone] [address] [employer] [address] Phone:  [phone]                                                   Phone:  [phone]  If unable to pay or uninsured, contact:  [facility] or [facility]. to become  qualified for the adult dental clinic.  Chronic Pain Problems Contact [facility]  [phone] Patients need to be referred by their primary care doctor.  Insufficient Money for Medicine Contact [employer]:  call "211" or [facility] [phone].  Psychological Services [facility]al Health  [phone] [facility]  [phone] [facility]   [phone] (emergency services [phone])  Substance Abuse Resources [facility]  [phone] [facility] [phone] [facility] [place] [phone] Floydene Flock [phone] Residential & Outpatient Substance Abuse Program  [phone]  Abuse/Neglect [facility] Child Abuse Hotline 2024006348 [facility] Child Abuse Hotline [phone] (After Hours)  Emergency Shelter [facility] Ministries [phone]  Maternity Homes Room at the [place] [facility] [place] [phone] [place] [facility] [phone]  [facility] [place]  [facility] [facility] [address] [place]                       [address]      [address]  [place]                                                [place]                            [place] Phone:  [phone]                                   Phone:  [phone]                 Phone:  [phone]  [place] [facility] Phone:  [phone]  [place] Child Abuse Hotline [phone] (After Hours)    

## 2012-03-27 NOTE — ED Notes (Signed)
Pt in from home states increased anxiety states "feels like hair is crawling all over me" states a hx of throat cancer states has been hoarse for several weeks.denies pain or discomfort pt has a hx of anxiety states took meds this am states no relief states still feel anxious

## 2012-03-28 NOTE — ED Provider Notes (Signed)
Medical screening examination/treatment/procedure(s) were performed by non-physician practitioner and as supervising physician I was immediately available for consultation/collaboration.   Erubiel Manasco E Tierria Watson, MD 03/28/12 0752 

## 2013-11-03 ENCOUNTER — Ambulatory Visit (INDEPENDENT_AMBULATORY_CARE_PROVIDER_SITE_OTHER): Payer: Medicare Other | Admitting: Interventional Cardiology

## 2013-11-03 ENCOUNTER — Encounter: Payer: Self-pay | Admitting: Interventional Cardiology

## 2013-11-03 ENCOUNTER — Encounter (INDEPENDENT_AMBULATORY_CARE_PROVIDER_SITE_OTHER): Payer: Self-pay

## 2013-11-03 VITALS — BP 152/83 | HR 73 | Ht 65.0 in | Wt 179.0 lb

## 2013-11-03 DIAGNOSIS — R079 Chest pain, unspecified: Secondary | ICD-10-CM

## 2013-11-03 DIAGNOSIS — J449 Chronic obstructive pulmonary disease, unspecified: Secondary | ICD-10-CM

## 2013-11-03 DIAGNOSIS — F1010 Alcohol abuse, uncomplicated: Secondary | ICD-10-CM

## 2013-11-03 DIAGNOSIS — J4489 Other specified chronic obstructive pulmonary disease: Secondary | ICD-10-CM

## 2013-11-03 DIAGNOSIS — F4310 Post-traumatic stress disorder, unspecified: Secondary | ICD-10-CM

## 2013-11-03 NOTE — Patient Instructions (Signed)
Your physician recommends that you continue on your current medications as directed. Please refer to the Current Medication list given to you today.  Your physician has requested that you have en exercise stress myoview. For further information please visit [email or web address]. Please follow instruction sheet, as given. Possible Lexiscan if patient cannot walk on treadmill.  Your physician recommends that you follow-up as needed.

## 2013-11-03 NOTE — Progress Notes (Signed)
Patient ID: David Lucero, male   DOB: 10-12-1946, 68 y.o.   MRN: [id number]   Date: 11/03/2013 ID: David Lucero, DOB 10-04-1946, MRN [id number] PCP: No primary provider on file.  Reason: Chest discomfort  ASSESSMENT;  1. Chest pain, intermittent/sporadically,  for 6 months. 2. Anxiety with chronic insomnia 3. History of tobacco use 4. History of "throat cancer status post radiation and chemotherapy  PLAN:  1. Stress/LexiScan nuclear perfusion study to rule out coronary artery disease 2. Encouraged smoking cessation   SUBJECTIVE: David Lucero is a 68 y.o. male who is referred from the [place] [facility] [employer] for evaluation of chest pain. The patient has a history of smoking. He has been a heavy smoker for greater than 40 years. He has a history of throat cancer and is status post radiation and chemotherapy. Over the past 4-12 months he has experienced intermittent episodes of chest pain and a poorly characterized. Does not appear to be an exertional component. The discomfort occurs at random. He can last up to 5 minutes before resolving. It does not radiate to the arms or neck. There is no associated dyspnea, diaphoresis, or palpitations. He has no prior history of cardiovascular disease. No history of stroke.   No Known Allergies  Current Outpatient Prescriptions on File Prior to Visit  Medication Sig Dispense Refill  . acetaminophen (TYLENOL) 500 MG tablet Take 500 mg by mouth every 4 (four) hours as needed. For pain      . calcium carbonate (TUMS - DOSED IN MG ELEMENTAL CALCIUM) 500 MG chewable tablet Chew 1 tablet by mouth as needed. For heartburn        . traZODone (DESYREL) 100 MG tablet Take 100 mg by mouth at bedtime.         No current facility-administered medications on file prior to visit.    Past Medical History  Diagnosis Date  . Anxiety   . Cancer     No past surgical history on file.  History    Social History  . Marital Status: Single    Spouse Name: N/A    Number of Children: N/A  . Years of Education: N/A   Occupational History  . Not on file.   Social History Main Topics  . Smoking status: Current Every Day Smoker -- 30 years    Types: Cigarettes  . Smokeless tobacco: Not on file  . Alcohol Use: No  . Drug Use: No  . Sexual Activity:    Other Topics Concern  . Not on file   Social History Narrative  . No narrative on file    Family History  Problem Relation Age of Onset  . Heart attack Father     ROS: Appetite is stable. Lives independently. No family. No claudication. Denies palpitations and syncope. No peripheral edema. Denies dysphagia. No change in voice. No weight loss.. Other systems negative for complaints.  OBJECTIVE: BP 152/83  Pulse 73  Ht 5\' 5"  (1.651 m)  Wt 179 lb (81.194 kg)  BMI 29.79 kg/m2,  General: No acute distress, appears older than stated age 29: normal no jaundice or pallor Neck: JVD difficult to visualize but do not appear to be elevated. Carotids no bruits. Upstroke is 2+ bilateral Chest: Faint basilar crackles. No wheezing is heard. No rales are heard other than at the bases. Cardiac: Murmur: Absent. Gallop: Absent. Rhythm: Regular. Other: Distant heart sounds Abdomen: Bruit: Absent. Pulsation: Absent Extremities: Edema: Absent. Pulses: Unable to  palpate lower extremity pulses other than popliteal bilaterally. Neuro: Tremor is present. Possible tardive dyskinesia. No focal deficit. Psych: Anxious  ECG: Left axis deviation, first degree AV block, early QRS transition. No acute change. No evidence of infarction  Echocardiogram: 2011 Study Conclusions  - Left ventricle: The cavity size was normal. Wall thickness was normal. Systolic function was normal. The estimated ejection fraction was in the range of 55% to 60%. Wall motion was normal; there were no regional wall motion abnormalities. Features are consistent with a  pseudonormal left ventricular filling pattern, with concomitant abnormal relaxation and increased filling pressure (grade 2 diastolic dysfunction). - Left atrium: The atrium was mildly dilated.

## 2013-11-17 ENCOUNTER — Encounter (HOSPITAL_COMMUNITY): Payer: Medicare Other

## 2013-11-21 ENCOUNTER — Encounter (HOSPITAL_COMMUNITY): Payer: Medicare Other

## 2013-12-05 ENCOUNTER — Telehealth: Payer: Self-pay | Admitting: Interventional Cardiology

## 2013-12-05 NOTE — Telephone Encounter (Signed)
pt given instructions and what to expect the day of his nuclear stress test .pt verbalized understanding.

## 2013-12-05 NOTE — Telephone Encounter (Signed)
New Problem:  Pt is requesting a call back to discuss his nuc stress test. Pt wants to know what all is envolved and why it takes 3-4 hours.

## 2013-12-07 ENCOUNTER — Encounter (HOSPITAL_COMMUNITY): Payer: Medicare Other

## 2013-12-12 ENCOUNTER — Ambulatory Visit (HOSPITAL_COMMUNITY): Payer: Medicare Other | Attending: Interventional Cardiology | Admitting: Radiology

## 2013-12-12 DIAGNOSIS — R079 Chest pain, unspecified: Secondary | ICD-10-CM

## 2013-12-12 DIAGNOSIS — R0989 Other specified symptoms and signs involving the circulatory and respiratory systems: Secondary | ICD-10-CM

## 2013-12-12 MED ORDER — TECHNETIUM TC 99M SESTAMIBI GENERIC - CARDIOLITE
33.0000 | Freq: Once | INTRAVENOUS | Status: AC | PRN
  Administered 2013-12-12: 33 via INTRAVENOUS

## 2013-12-19 ENCOUNTER — Encounter (HOSPITAL_COMMUNITY): Payer: Medicare Other

## 2013-12-20 ENCOUNTER — Encounter (HOSPITAL_COMMUNITY): Payer: Medicare Other

## 2014-01-03 ENCOUNTER — Encounter: Payer: Self-pay | Admitting: Cardiology

## 2014-01-03 ENCOUNTER — Ambulatory Visit (HOSPITAL_COMMUNITY): Payer: Medicare Other | Attending: Cardiology | Admitting: Radiology

## 2014-01-03 VITALS — BP 144/97 | HR 67 | Ht 65.0 in | Wt 177.0 lb

## 2014-01-03 DIAGNOSIS — R079 Chest pain, unspecified: Secondary | ICD-10-CM

## 2014-01-03 MED ORDER — REGADENOSON 0.4 MG/5ML IV SOLN
0.4000 mg | Freq: Once | INTRAVENOUS | Status: AC
  Administered 2014-01-03: 0.4 mg via INTRAVENOUS

## 2014-01-03 MED ORDER — TECHNETIUM TC 99M SESTAMIBI GENERIC - CARDIOLITE
30.0000 | Freq: Once | INTRAVENOUS | Status: AC | PRN
  Administered 2014-01-03: 30 via INTRAVENOUS

## 2014-01-03 NOTE — Progress Notes (Signed)
  [facility] [address] [place], [place] [address] [phone]    Cardiology Nuclear Med Study  David Lucero is a 68 y.o. male     MRN : [id number]     DOB: 23-Jul-1946  Procedure Date: 01/03/2014  Nuclear Med Background Indication for Stress Test:  Evaluation for Ischemia History:  No known CAD, Echo 2011 EF 55-60%, COPD, Throat Cancer Cardiac Risk Factors: Family History - CAD, Lipids and Smoker  Symptoms:  Chest Pain   Nuclear Pre-Procedure Caffeine/Decaff Intake:  None NPO After: 12:00am   Lungs:  clear O2 Sat: 93% on room air. IV 0.9% NS with Angio Cath:  22g  IV Site: R Hand  IV Started by:  Matilde Haymaker, RN  Chest Size (in):  42 Cup Size: n/a  Height: 5\' 5"  (1.651 m)  Weight:  177 lb (80.287 kg)  BMI:  Body mass index is 29.45 kg/(m^2). Tech Comments:  n/a    Nuclear Med Study 1 or 2 day study: 1 day  Stress Test Type:  Lexiscan  Reading MD: n/a  Order Authorizing Provider:  Wyvonnia Lora  Resting Radionuclide: Technetium 24m Sestamibi  Resting Radionuclide Dose: 33.0 mCi on 12/12/13   Stress Radionuclide:  Technetium 14m Sestamibi  Stress Radionuclide Dose: 33.0 mCi on 01/03/14           Stress Protocol Rest HR: 67 Stress HR: 81  Rest BP: 144/97 Stress BP: 130/80  Exercise Time (min): n/a METS: n/a           Dose of Adenosine (mg):  n/a Dose of Lexiscan: 0.4 mg  Dose of Atropine (mg): n/a Dose of Dobutamine: n/a mcg/kg/min (at max HR)  Stress Test Technologist: Glade Lloyd, BS-ES  Nuclear Technologist:  Annye Rusk, CNMT     Rest Procedure:  Myocardial perfusion imaging was performed at rest 45 minutes following the intravenous administration of Technetium 42m Sestamibi. Rest ECG: NSR with non-specific ST-T wave changes  Stress Procedure:  The patient received IV Lexiscan 0.4 mg over 15-seconds.  Technetium 30m Sestamibi injected at 30-seconds.  Quantitative spect images were obtained after a 45 minute  delay. Stress ECG: No significant change from baseline ECG  QPS Raw Data Images:  Normal; no motion artifact; normal heart/lung ratio. Diaphragmatic attenuation mild.  Stress Images:  Mildly decreased uptake at both rest and stress inferior myocardium consistent with diaphragmatic attenuation.  Rest Images:  As above. Subtraction (SDS):  No evidence of ischemia. Transient Ischemic Dilatation (Normal <1.22):  0.83 Lung/Heart Ratio (Normal <0.45):  0.32  Quantitative Gated Spect Images QGS EDV:  64 ml QGS ESV:  10 ml  Impression Exercise Capacity:  Lexiscan with no exercise. BP Response:  Normal blood pressure response. Clinical Symptoms:  No significant symptoms noted. ECG Impression:  No significant ST segment change suggestive of ischemia. Comparison with Prior Nuclear Study: No previous nuclear study performed  Overall Impression:  Low risk stress nuclear study with no ischemia. .  LV Ejection Fraction: 84%.  LV Wall Motion:  NL LV Function; NL Wall Motion   Candee Furbish, MD

## 2014-01-05 ENCOUNTER — Telehealth: Payer: Self-pay

## 2014-01-05 NOTE — Telephone Encounter (Signed)
Message copied by Lamar Laundry on Thu Jan 05, 2014  2:05 PM ------      Message from: Daneen Schick      Created: Wed Jan 04, 2014  9:00 PM       No evidence of blocked arteries ------

## 2014-01-05 NOTE — Telephone Encounter (Signed)
pt given myoview results.  No evidence of blocked arteries.pt verbalized understanding.

## 2014-01-19 ENCOUNTER — Other Ambulatory Visit: Payer: Self-pay | Admitting: Vascular Surgery

## 2014-01-19 DIAGNOSIS — R209 Unspecified disturbances of skin sensation: Secondary | ICD-10-CM

## 2014-01-19 DIAGNOSIS — R0989 Other specified symptoms and signs involving the circulatory and respiratory systems: Secondary | ICD-10-CM

## 2014-01-25 ENCOUNTER — Ambulatory Visit: Payer: Self-pay | Admitting: Podiatry

## 2014-02-13 ENCOUNTER — Encounter: Payer: Self-pay | Admitting: Vascular Surgery

## 2014-02-14 ENCOUNTER — Encounter: Payer: Self-pay | Admitting: Vascular Surgery

## 2014-02-14 ENCOUNTER — Ambulatory Visit (INDEPENDENT_AMBULATORY_CARE_PROVIDER_SITE_OTHER): Payer: Medicare Other | Admitting: Vascular Surgery

## 2014-02-14 ENCOUNTER — Ambulatory Visit (HOSPITAL_COMMUNITY)
Admission: RE | Admit: 2014-02-14 | Discharge: 2014-02-14 | Disposition: A | Discharge status: Home / Self Care | Payer: Medicare Other | Source: Ambulatory Visit | Attending: Vascular Surgery | Admitting: Vascular Surgery

## 2014-02-14 VITALS — BP 134/76 | HR 64 | Resp 16 | Ht 66.0 in | Wt 172.0 lb

## 2014-02-14 DIAGNOSIS — R0989 Other specified symptoms and signs involving the circulatory and respiratory systems: Secondary | ICD-10-CM

## 2014-02-14 DIAGNOSIS — I739 Peripheral vascular disease, unspecified: Secondary | ICD-10-CM

## 2014-02-14 DIAGNOSIS — R209 Unspecified disturbances of skin sensation: Secondary | ICD-10-CM

## 2014-02-14 NOTE — Progress Notes (Signed)
Subjective:     Patient ID: David Lucero, male   DOB: 20-Feb-1946, 68 y.o.   MRN: [id number]  HPI this 68 year old male was referred by Dr. Florina Ou for evaluation of lower extremity occlusive disease. The patient states that his feet feel as if there are "pins and needles" at times. He does ambulate without Discomfort. He has no history of infection, gangrene, nonhealing ulcers, or other problems with the feet. He denies a history of diabetes mellitus or peripheral neuropathy. He does have a long history of tobacco abuse smoking approximately 2 packs of cigarettes per day for 50+ years. He also has a history of throat cancer treated with radiation and chemotherapy 10 years ago at the [facility].  Past Medical History  Diagnosis Date  . Anxiety   . Cancer     History  Substance Use Topics  . Smoking status: Current Every Day Smoker -- 30 years    Types: Cigarettes  . Smokeless tobacco: Not on file  . Alcohol Use: No    Family History  Problem Relation Age of Onset  . Heart attack Father     No Known Allergies  Current outpatient prescriptions:acetaminophen (TYLENOL) 500 MG tablet, Take 500 mg by mouth every 4 (four) hours as needed. For pain, Disp: , Rfl: ;  calcium carbonate (TUMS - DOSED IN MG ELEMENTAL CALCIUM) 500 MG chewable tablet, Chew 1 tablet by mouth as needed. For heartburn  , Disp: , Rfl: ;  clonazePAM (KLONOPIN) 0.5 MG tablet, Take 0.5 mg by mouth daily., Disp: , Rfl:  LORazepam (ATIVAN) 0.5 MG tablet, Take 0.5 mg by mouth every 8 (eight) hours., Disp: , Rfl: ;  traZODone (DESYREL) 100 MG tablet, Take 100 mg by mouth at bedtime.  , Disp: , Rfl:   BP 134/76  Pulse 64  Resp 16  Ht 5\' 6"  (1.676 m)  Wt 172 lb (78.019 kg)  BMI 27.77 kg/m2  Body mass index is 27.77 kg/(m^2).            Review of Systems history of throat cancer. Does occasionally have chest pressure, pain in legs with walking, weakness in arms and legs, numbness in legs. Also complains of  occasional dizziness. No other symptoms in a complete review of systems     Objective:   Physical Exam BP 134/76  Pulse 64  Resp 16  Ht 5\' 6"  (1.676 m)  Wt 172 lb (78.019 kg)  BMI 27.77 kg/m2 Gen.-alert and oriented x3 in no apparent distress HEENT normal for age Lungs no rhonchi or wheezing Cardiovascular regular rhythm no murmurs carotid pulses 3+ palpable no bruits audible Abdomen soft nontender no palpable masses Musculoskeletal free of  major deformities Skin clear -no rashes Neurologic normal Lower extremities 3+ femoral and 2+ popliteal pulses palpable bilaterally. Right foot with 2 clusters Allis pedis pulse. Left foot with no easily palpable pulses. Both feet well perfused with no evidence of infection or gangrene.  Today lower extremity arterial Dopplers were performed which are reviewed and interpreted. He has biphasic flow in both feet with ABIs exceeding 1.0 bilaterally but does have evidence of diffuse occlusive disease.        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 Suspect tingling in the feet is due to peripheral neuropathy Return to see me on a when necessary basis-would only work this up further if he develops limb threatening ischemia

## 2014-04-30 ENCOUNTER — Emergency Department (HOSPITAL_COMMUNITY)
Admission: EM | Admit: 2014-04-30 | Discharge: 2014-04-30 | Disposition: A | Discharge status: Home / Self Care | Payer: Medicare Other | Attending: Emergency Medicine | Admitting: Emergency Medicine

## 2014-04-30 ENCOUNTER — Emergency Department (HOSPITAL_COMMUNITY): Payer: Medicare Other

## 2014-04-30 ENCOUNTER — Encounter (HOSPITAL_COMMUNITY): Payer: Self-pay | Admitting: Emergency Medicine

## 2014-04-30 DIAGNOSIS — J449 Chronic obstructive pulmonary disease, unspecified: Secondary | ICD-10-CM | POA: Diagnosis not present

## 2014-04-30 DIAGNOSIS — R5383 Other fatigue: Secondary | ICD-10-CM

## 2014-04-30 DIAGNOSIS — F1010 Alcohol abuse, uncomplicated: Secondary | ICD-10-CM | POA: Insufficient documentation

## 2014-04-30 DIAGNOSIS — Z85118 Personal history of other malignant neoplasm of bronchus and lung: Secondary | ICD-10-CM | POA: Diagnosis not present

## 2014-04-30 DIAGNOSIS — F17200 Nicotine dependence, unspecified, uncomplicated: Secondary | ICD-10-CM | POA: Insufficient documentation

## 2014-04-30 DIAGNOSIS — Z8679 Personal history of other diseases of the circulatory system: Secondary | ICD-10-CM | POA: Insufficient documentation

## 2014-04-30 DIAGNOSIS — R5381 Other malaise: Secondary | ICD-10-CM | POA: Diagnosis not present

## 2014-04-30 DIAGNOSIS — Z79899 Other long term (current) drug therapy: Secondary | ICD-10-CM | POA: Diagnosis not present

## 2014-04-30 DIAGNOSIS — Z859 Personal history of malignant neoplasm, unspecified: Secondary | ICD-10-CM | POA: Insufficient documentation

## 2014-04-30 DIAGNOSIS — F411 Generalized anxiety disorder: Secondary | ICD-10-CM | POA: Insufficient documentation

## 2014-04-30 DIAGNOSIS — R209 Unspecified disturbances of skin sensation: Secondary | ICD-10-CM | POA: Diagnosis not present

## 2014-04-30 DIAGNOSIS — J4489 Other specified chronic obstructive pulmonary disease: Secondary | ICD-10-CM | POA: Insufficient documentation

## 2014-04-30 HISTORY — DX: Malignant neoplasm of unspecified part of unspecified bronchus or lung: C34.90

## 2014-04-30 HISTORY — DX: Peripheral vascular disease, unspecified: I73.9

## 2014-04-30 HISTORY — DX: Chronic obstructive pulmonary disease, unspecified: J44.9

## 2014-04-30 HISTORY — DX: Post-traumatic stress disorder, unspecified: F43.10

## 2014-04-30 LAB — CBC WITH DIFFERENTIAL/PLATELET
BASOS PCT: 1 % (ref 0–1)
Basophils Absolute: 0.1 10*3/uL (ref 0.0–0.1)
EOS PCT: 3 % (ref 0–5)
Eosinophils Absolute: 0.2 10*3/uL (ref 0.0–0.7)
HEMATOCRIT: 42.6 % (ref 39.0–52.0)
Hemoglobin: 13.6 g/dL (ref 13.0–17.0)
Lymphocytes Relative: 26 % (ref 12–46)
Lymphs Abs: 1.5 10*3/uL (ref 0.7–4.0)
MCH: 28.5 pg (ref 26.0–34.0)
MCHC: 31.9 g/dL (ref 30.0–36.0)
MCV: 89.1 fL (ref 78.0–100.0)
MONO ABS: 0.6 10*3/uL (ref 0.1–1.0)
Monocytes Relative: 9 % (ref 3–12)
NEUTROS ABS: 3.7 10*3/uL (ref 1.7–7.7)
Neutrophils Relative %: 61 % (ref 43–77)
Platelets: 223 10*3/uL (ref 150–400)
RBC: 4.78 MIL/uL (ref 4.22–5.81)
RDW: 14.9 % (ref 11.5–15.5)
WBC: 6 10*3/uL (ref 4.0–10.5)

## 2014-04-30 LAB — URINALYSIS, ROUTINE W REFLEX MICROSCOPIC
BILIRUBIN URINE: NEGATIVE
Glucose, UA: NEGATIVE mg/dL
HGB URINE DIPSTICK: NEGATIVE
KETONES UR: NEGATIVE mg/dL
Leukocytes, UA: NEGATIVE
Nitrite: NEGATIVE
Protein, ur: NEGATIVE mg/dL
SPECIFIC GRAVITY, URINE: 1.02 (ref 1.005–1.030)
UROBILINOGEN UA: 0.2 mg/dL (ref 0.0–1.0)
pH: 5 (ref 5.0–8.0)

## 2014-04-30 LAB — COMPREHENSIVE METABOLIC PANEL
ALBUMIN: 3.4 g/dL — AB (ref 3.5–5.2)
ALT: 7 U/L (ref 0–53)
AST: 18 U/L (ref 0–37)
Alkaline Phosphatase: 49 U/L (ref 39–117)
Anion gap: 17 — ABNORMAL HIGH (ref 5–15)
BUN: 6 mg/dL (ref 6–23)
CO2: 20 mEq/L (ref 19–32)
CREATININE: 0.95 mg/dL (ref 0.50–1.35)
Calcium: 9.4 mg/dL (ref 8.4–10.5)
Chloride: 103 mEq/L (ref 96–112)
GFR calc Af Amer: >90 mL/min (ref >90)
GFR calc non Af Amer: 83 mL/min — ABNORMAL LOW (ref >90)
Glucose, Bld: 92 mg/dL (ref 70–99)
Potassium: 4.4 mEq/L (ref 3.7–5.3)
SODIUM: 140 meq/L (ref 137–147)
Total Bilirubin: <0.2 mg/dL — ABNORMAL LOW (ref 0.3–1.2)
Total Protein: 7.1 g/dL (ref 6.0–8.3)

## 2014-04-30 LAB — RAPID URINE DRUG SCREEN, HOSP PERFORMED
Amphetamines: NOT DETECTED
BENZODIAZEPINES: NOT DETECTED
Barbiturates: NOT DETECTED
Cocaine: NOT DETECTED
OPIATES: NOT DETECTED
Tetrahydrocannabinol: NOT DETECTED

## 2014-04-30 LAB — ETHANOL

## 2014-04-30 MED ORDER — LORAZEPAM 2 MG/ML IJ SOLN
1.0000 mg | Freq: Once | INTRAMUSCULAR | Status: AC
  Administered 2014-04-30: 1 mg via INTRAVENOUS
  Filled 2014-04-30: qty 1

## 2014-04-30 NOTE — Discharge Instructions (Signed)
Alcohol Withdrawal Anytime drug use is interfering with normal living activities it has become abuse. This includes problems with family and friends. Psychological dependence has developed when your mind tells you that the drug is needed. This is usually followed by physical dependence when a continuing increase of drugs are required to get the same feeling or "high." This is known as addiction or chemical dependency. A person's risk is much higher if there is a history of chemical dependency in the family. Mild Withdrawal Following Stopping Alcohol, When Addiction or Chemical Dependency Has Developed When a person has developed tolerance to alcohol, any sudden stopping of alcohol can cause uncomfortable physical symptoms. Most of the time these are mild and consist of tremors in the hands and increases in heart rate, breathing, and temperature. Sometimes these symptoms are associated with anxiety, panic attacks, and bad dreams. There may also be stomach upset. Normal sleep patterns are often interrupted with periods of inability to sleep (insomnia). This may last for 6 months. Because of this discomfort, many people choose to continue drinking to get rid of this discomfort and to try to feel normal. Severe Withdrawal with Decreased or No Alcohol Intake, When Addiction or Chemical Dependency Has Developed About five percent of alcoholics will develop signs of severe withdrawal when they stop using alcohol. One sign of this is development of generalized seizures (convulsions). Other signs of this are severe agitation and confusion. This may be associated with believing in things which are not real or seeing things which are not really there (delusions and hallucinations). Vitamin deficiencies are usually present if alcohol intake has been long-term. Treatment for this most often requires hospitalization and close observation. Addiction can only be helped by stopping use of all chemicals. This is hard but may  save your life. With continual alcohol use, possible outcomes are usually loss of self respect and esteem, violence, and death. Addiction cannot be cured but it can be stopped. This often requires outside help and the care of professionals. Treatment centers are listed in the yellow pages under Cocaine, Narcotics, and Alcoholics Anonymous. Most hospitals and clinics can refer you to a specialized care center. It is not necessary for you to go through the uncomfortable symptoms of withdrawal. Your caregiver can provide you with medicines that will help you through this difficult period. Try to avoid situations, friends, or drugs that made it possible for you to keep using alcohol in the past. Learn how to say no. It takes a long period of time to overcome addictions to all drugs, including alcohol. There may be many times when you feel as though you want a drink. After getting rid of the physical addiction and withdrawal, you will have a lessening of the craving which tells you that you need alcohol to feel normal. Call your caregiver if more support is needed. Learn who to talk to in your family and among your friends so that during these periods you can receive outside help. Alcoholics Anonymous (AA) has helped many people over the years. To get further help, contact AA or call your caregiver, counselor, or clergyperson. Al-Anon and Alateen are support groups for friends and family members of an alcoholic. The people who love and care for an alcoholic often need help, too. For information about these organizations, check your phone directory or call a local alcoholism treatment center.  SEEK IMMEDIATE MEDICAL CARE IF:   You have a seizure.  You have a fever.  You experience uncontrolled vomiting or you  vomit up blood. This may be bright red or look like black coffee grounds.  You have blood in the stool. This may be bright red or appear as a black, tarry, bad-smelling stool.  You become lightheaded or  faint. Do not drive if you feel this way. Have someone else drive you or call 443 for help.  You become more agitated or confused.  You develop uncontrolled anxiety.  You begin to see things that are not really there (hallucinate). Your caregiver has determined that you completely understand your medical condition, and that your mental state is back to normal. You understand that you have been treated for alcohol withdrawal, have agreed not to drink any alcohol for a minimum of 1 day, will not operate a car or other machinery for 24 hours, and have had an opportunity to ask any questions about your condition. Document Released: 07/09/2005 Document Revised: 12/22/2011 Document Reviewed: 05/17/2008 [facility] Patient Information 2015 [place], [place]. This information is not intended to replace advice given to you by your health care provider. Make sure you discuss any questions you have with your health care provider.      Behavioral Health Resources in the [facility]  Intensive Outpatient Programs: [facility]      [place]. [place], [place] Both a day and evening program       [facility] Outpatient     [address]        [place], [place] [address] [phone]         ADS: Alcohol & Drug Sv[facility] [place]: [phone] or [phone] [address] [place], [place] [address] [email or web address]  Mobile Crisis Teams:                                        Therapeutic Alternatives         Mobile Crisis Care Unit [phone]             [facility]ive [facility] Services [address] Lady Gary Bock [address] [place] [phone]  Self-Help/Support Groups: [facility] [employer]  of support groups [phone] (call for more info)  Narcotics Anonymous (NA) Caring Services [address] [place] - 2 meetings at this location  Residential Treatment Programs:  [facility] [place]       [place] [address], [place], [place]  [address] [place]  [address] [place], [place] [address] [phone] Admissions: 8am-3pm M-F  Incentives Substance [place]     [address] [place], [place] [address]       [phone]         The [place] [address] Jadene Pierini [place], [place]  The [facility] [address] [place], [place]  [facility] Programs -  Intensive Outpatient      [address]     [place], [place]         [facility] ([place]     [place], [place] or [phone]  Residential Treatment Services (RTS)  [place], [place]  Fellowship [address]                                               [place]  [facility] Resources: [place] [phone]               General Therapy                                                Domenic Schwab, PhD        [address] [place], [place] [address]         [facility]   [address] [place], [place] [address] [phone]  [facility] [address] [place], [place] [address] [phone] Insurance/Medicaid/sponsorship through [facility] and Families                                              [address] [place]                                        [place], [place] [address]    Therapy/tele-psych/case         [place] [address],   [address]  Adolescent/group home/case management [phone]                                           Rosette Reveal PhD       General therapy       Insurance   [phone]         Dr. Adele Schilder Insurance [phone] M-F  [place] [facility] Medicaid, sponsorship [phone]

## 2014-04-30 NOTE — ED Notes (Signed)
Bed: EB58 Expected date:  Expected time:  Means of arrival:  Comments: [place] B

## 2014-04-30 NOTE — ED Notes (Signed)
Patient repeatedly asking for food. This tech notified that he would have to give sample to get food. Patient finally gave small urine sample. Tech gave sandwich and diet coke, ok 'd per DR.

## 2014-04-30 NOTE — ED Notes (Signed)
Patient reports he is not having any pain, some numbness still on his left side. Alert and oriented.  Able to stand beside stretcher without losing balance.

## 2014-04-30 NOTE — ED Provider Notes (Addendum)
CSN: [id number]     Arrival date & time 04/30/14  1222 History   First MD Initiated Contact with Patient 04/30/14 1235     Chief Complaint  Patient presents with  . Extremity Weakness     (Consider location/radiation/quality/duration/timing/severity/associated sxs/prior Treatment) HPI Comments: Patient here with intermittent left sided weakness x3 weeks that is associated with drinking alcohol. He characterizes the weakness as trouble starting movement of his left arm and left leg but denies any loss of strength. He notes intermittent paresthesias of the left upper and left lower extremity but denies any facial involvement. States that he drinks alcohol daily approximately 12 cans. Denies any headache or head trauma. Says that he is dizzy and off balance which also gets worse after he uses alcohol. Last use was today. Denies any vomiting or diarrhea. Symptoms last for seconds to minutes. He has no trouble speaking. No treatment used for this prior to arrival. Feels better when he is not drinking and denies any alcohol withdrawal symptoms at this time  Patient is a 68 y.o. male presenting with extremity weakness. The history is provided by the patient.  Extremity Weakness    Past Medical History  Diagnosis Date  . Anxiety   . Cancer   . PTSD (post-traumatic stress disorder)   . PAD (peripheral artery disease)   . Lung cancer   . COPD (chronic obstructive pulmonary disease)    History reviewed. No pertinent past surgical history. Family History  Problem Relation Age of Onset  . Heart attack Father    History  Substance Use Topics  . Smoking status: Current Every Day Smoker -- 30 years    Types: Cigarettes  . Smokeless tobacco: Not on file  . Alcohol Use: No    Review of Systems  Musculoskeletal: Positive for extremity weakness.  All other systems reviewed and are negative.     Allergies  Review of patient's allergies indicates no known allergies.  Home Medications    Prior to Admission medications   Medication Sig Start Date End Date Taking? Authorizing Provider  acetaminophen (TYLENOL) 500 MG tablet Take 500 mg by mouth every 4 (four) hours as needed. For pain   Yes Historical Provider, MD  Aspirin-Acetaminophen-Caffeine 260-130-16 MG TABS Take 1 packet by mouth as needed (head aches).   Yes Historical Provider, MD  clonazePAM (KLONOPIN) 0.5 MG tablet Take 0.5 mg by mouth daily.   Yes Historical Provider, MD  traZODone (DESYREL) 100 MG tablet Take 100 mg by mouth at bedtime.     Yes Historical Provider, MD   BP 120/65  Pulse 81  Temp(Src) 97.6 F (36.4 C) (Oral)  Resp 16  SpO2 92% Physical Exam  Nursing note and vitals reviewed. Constitutional: He is oriented to person, place, and time. He appears well-developed and well-nourished.  Non-toxic appearance. No distress.  HENT:  Head: Normocephalic and atraumatic.  Eyes: Conjunctivae, EOM and lids are normal. Pupils are equal, round, and reactive to light.  Neck: Normal range of motion. Neck supple. No tracheal deviation present. No mass present.  Cardiovascular: Normal rate, regular rhythm and normal heart sounds.  Exam reveals no gallop.   No murmur heard. Pulmonary/Chest: Effort normal and breath sounds normal. No stridor. No respiratory distress. He has no decreased breath sounds. He has no wheezes. He has no rhonchi. He has no rales.  Abdominal: Soft. Normal appearance and bowel sounds are normal. He exhibits no distension. There is no tenderness. There is no rebound and no CVA tenderness.  Musculoskeletal: Normal range of motion. He exhibits no edema and no tenderness.  Neurological: He is alert and oriented to person, place, and time. He has normal strength. No cranial nerve deficit or sensory deficit. GCS eye subscore is 4. GCS verbal subscore is 5. GCS motor subscore is 6.  Skin: Skin is warm and dry. No abrasion and no rash noted.  Psychiatric: He has a normal mood and affect. His speech is  normal and behavior is normal.    ED Course  Procedures (including critical care time) Labs Review Labs Reviewed  ETHANOL  CBC WITH DIFFERENTIAL  COMPREHENSIVE METABOLIC PANEL  URINE RAPID DRUG SCREEN (HOSP PERFORMED)  URINALYSIS, ROUTINE W REFLEX MICROSCOPIC    Imaging Review No results found.   EKG Interpretation None      MDM   Final diagnoses:  None    Patients neurological exam completely normal here. His heart is regular rate. He had a slight tremor and associated with Ativan because the patient's alcohol level is normal. Patient offered detox but he has deferred and plans to go home and continued alcohol use. Patient to be given the resource guide. I do not believe that the patient's symptoms represent TIA or CVA  He has no evidence of DTs at this time Leota Jacobsen, MD 04/30/14 [place]  Leota Jacobsen, MD 04/30/14 [phone]

## 2014-04-30 NOTE — ED Notes (Signed)
Bed: [facility] Expected date:  Expected time:  Means of arrival:  Comments: ems weakness, etoh

## 2014-04-30 NOTE — ED Notes (Signed)
Patient called EMS due to left sided weakness that he has had for three weeks. EMS reports heavy ETOH use.  Usually drinks about a case of beer a day.  Patient went through detox about a year ago, but started drinking again three days later.  Denies chest pain, SOB. Patient does have a deep cough but has been a heavy smoker for years.

## 2014-05-14 ENCOUNTER — Emergency Department (HOSPITAL_COMMUNITY)
Admission: EM | Admit: 2014-05-14 | Discharge: 2014-05-15 | Disposition: A | Discharge status: Home / Self Care | Payer: Medicare Other | Attending: Dermatology | Admitting: Dermatology

## 2014-05-14 ENCOUNTER — Encounter (HOSPITAL_COMMUNITY): Payer: Self-pay | Admitting: Emergency Medicine

## 2014-05-14 DIAGNOSIS — J449 Chronic obstructive pulmonary disease, unspecified: Secondary | ICD-10-CM | POA: Diagnosis not present

## 2014-05-14 DIAGNOSIS — Z85118 Personal history of other malignant neoplasm of bronchus and lung: Secondary | ICD-10-CM | POA: Diagnosis not present

## 2014-05-14 DIAGNOSIS — Z8679 Personal history of other diseases of the circulatory system: Secondary | ICD-10-CM | POA: Diagnosis not present

## 2014-05-14 DIAGNOSIS — F1010 Alcohol abuse, uncomplicated: Secondary | ICD-10-CM

## 2014-05-14 DIAGNOSIS — Z79899 Other long term (current) drug therapy: Secondary | ICD-10-CM | POA: Diagnosis not present

## 2014-05-14 DIAGNOSIS — F411 Generalized anxiety disorder: Secondary | ICD-10-CM | POA: Insufficient documentation

## 2014-05-14 DIAGNOSIS — F17200 Nicotine dependence, unspecified, uncomplicated: Secondary | ICD-10-CM | POA: Insufficient documentation

## 2014-05-14 DIAGNOSIS — J4489 Other specified chronic obstructive pulmonary disease: Secondary | ICD-10-CM | POA: Insufficient documentation

## 2014-05-14 LAB — CBC
HCT: 43.4 % (ref 39.0–52.0)
Hemoglobin: 13.6 g/dL (ref 13.0–17.0)
MCH: 28.5 pg (ref 26.0–34.0)
MCHC: 31.3 g/dL (ref 30.0–36.0)
MCV: 91 fL (ref 78.0–100.0)
PLATELETS: 190 10*3/uL (ref 150–400)
RBC: 4.77 MIL/uL (ref 4.22–5.81)
RDW: 14.4 % (ref 11.5–15.5)
WBC: 5 10*3/uL (ref 4.0–10.5)

## 2014-05-14 LAB — COMPREHENSIVE METABOLIC PANEL
ALT: <5 U/L (ref 0–53)
AST: 13 U/L (ref 0–37)
Albumin: 3.2 g/dL — ABNORMAL LOW (ref 3.5–5.2)
Alkaline Phosphatase: 40 U/L (ref 39–117)
Anion gap: 13 (ref 5–15)
BUN: 6 mg/dL (ref 6–23)
CALCIUM: 9 mg/dL (ref 8.4–10.5)
CO2: 23 mEq/L (ref 19–32)
CREATININE: 0.82 mg/dL (ref 0.50–1.35)
Chloride: 100 mEq/L (ref 96–112)
GFR calc non Af Amer: 88 mL/min — ABNORMAL LOW (ref >90)
Glucose, Bld: 82 mg/dL (ref 70–99)
Potassium: 4.1 mEq/L (ref 3.7–5.3)
Sodium: 136 mEq/L — ABNORMAL LOW (ref 137–147)
Total Bilirubin: 0.2 mg/dL — ABNORMAL LOW (ref 0.3–1.2)
Total Protein: 6.6 g/dL (ref 6.0–8.3)

## 2014-05-14 LAB — RAPID URINE DRUG SCREEN, HOSP PERFORMED
Amphetamines: POSITIVE — AB
Barbiturates: NOT DETECTED
Benzodiazepines: NOT DETECTED
COCAINE: NOT DETECTED
OPIATES: NOT DETECTED
TETRAHYDROCANNABINOL: NOT DETECTED

## 2014-05-14 LAB — ETHANOL: Alcohol, Ethyl (B): <11 mg/dL (ref 0–11)

## 2014-05-14 MED ORDER — IBUPROFEN 200 MG PO TABS: 600.0000 mg | ORAL_TABLET | Freq: Four times a day (QID) | ORAL | Status: DC | PRN

## 2014-05-14 MED ORDER — THIAMINE HCL 100 MG/ML IJ SOLN: 100.0000 mg | Freq: Every day | INTRAMUSCULAR | Status: DC

## 2014-05-14 MED ORDER — VITAMIN B-1 100 MG PO TABS
100.0000 mg | ORAL_TABLET | Freq: Every day | ORAL | Status: DC
  Administered 2014-05-14: 100 mg via ORAL
  Filled 2014-05-14: qty 1

## 2014-05-14 MED ORDER — PANTOPRAZOLE SODIUM 20 MG PO TBEC
20.0000 mg | DELAYED_RELEASE_TABLET | Freq: Every day | ORAL | Status: DC
  Administered 2014-05-14: 20 mg via ORAL
  Filled 2014-05-14 (×2): qty 1

## 2014-05-14 MED ORDER — ACETAMINOPHEN 325 MG PO TABS
650.0000 mg | ORAL_TABLET | ORAL | Status: DC | PRN
  Administered 2014-05-15: 650 mg via ORAL
  Filled 2014-05-14: qty 2

## 2014-05-14 MED ORDER — TRAZODONE HCL 100 MG PO TABS
100.0000 mg | ORAL_TABLET | Freq: Every day | ORAL | Status: DC
  Administered 2014-05-14: 100 mg via ORAL
  Filled 2014-05-14: qty 1

## 2014-05-14 MED ORDER — ALUM & MAG HYDROXIDE-SIMETH 200-200-20 MG/5ML PO SUSP
15.0000 mL | Freq: Four times a day (QID) | ORAL | Status: DC | PRN
  Administered 2014-05-14 – 2014-05-15 (×2): 15 mL via ORAL
  Filled 2014-05-14 (×2): qty 30

## 2014-05-14 MED ORDER — LORAZEPAM 1 MG PO TABS
0.0000 mg | ORAL_TABLET | Freq: Four times a day (QID) | ORAL | Status: DC
  Filled 2014-05-14 (×2): qty 1

## 2014-05-14 MED ORDER — LORAZEPAM 1 MG PO TABS
0.0000 mg | ORAL_TABLET | Freq: Two times a day (BID) | ORAL | Status: DC
  Administered 2014-05-14 – 2014-05-15 (×2): 1 mg via ORAL

## 2014-05-14 MED ORDER — NICOTINE 21 MG/24HR TD PT24: 21.0000 mg | MEDICATED_PATCH | Freq: Every day | TRANSDERMAL | Status: DC | PRN

## 2014-05-14 NOTE — ED Notes (Signed)
tts in w/ pt

## 2014-05-14 NOTE — ED Notes (Signed)
Pt belongings: shirt, shorts, wallet, shoes.

## 2014-05-14 NOTE — ED Notes (Signed)
Pt has been told that we need a urine sample, attempted to go to the restroom but did not urinate.

## 2014-05-14 NOTE — ED Provider Notes (Addendum)
CSN: [id number]     Arrival date & time 05/14/14  1357 History   First MD Initiated Contact with Patient 05/14/14 1435     Chief Complaint  Patient presents with  . Anxiety  . Alcohol Problem     (Consider location/radiation/quality/duration/timing/severity/associated sxs/prior Treatment) Patient is a 68 y.o. male presenting with anxiety and alcohol problem. The history is provided by the patient.  Anxiety Pertinent negatives include no chest pain, no abdominal pain, no headaches and no shortness of breath.  Alcohol Problem Pertinent negatives include no chest pain, no abdominal pain, no headaches and no shortness of breath.  pt with hx etoh abuse, anxiety, copd, c/o wanting help stopping drinking. States drinks 12 beers a day. When stops drinking will get mildly shaky, but denies hx severe shakes, seizures, complicated etoh withdrawal, or dts.  States physical health c/w baseline. Eating and drinking normally. No recent illness or fevers. Denies any recent trauma or fall. States drove self to ED.  Denies any recent change in meds. Pt also states hx anxiety. Denies depression.     Past Medical History  Diagnosis Date  . Anxiety   . Cancer   . PTSD (post-traumatic stress disorder)   . PAD (peripheral artery disease)   . Lung cancer   . COPD (chronic obstructive pulmonary disease)    History reviewed. No pertinent past surgical history. Family History  Problem Relation Age of Onset  . Heart attack Father    History  Substance Use Topics  . Smoking status: Current Every Day Smoker -- 30 years    Types: Cigarettes  . Smokeless tobacco: Not on file  . Alcohol Use: No    Review of Systems  Constitutional: Negative for fever.  HENT: Negative for sore throat.   Eyes: Negative for redness.  Respiratory: Negative for shortness of breath.   Cardiovascular: Negative for chest pain.  Gastrointestinal: Negative for vomiting, abdominal pain and diarrhea.  Genitourinary: Negative for  flank pain.  Musculoskeletal: Negative for back pain and neck pain.  Skin: Negative for rash.  Neurological: Negative for headaches.  Hematological: Does not bruise/bleed easily.  Psychiatric/Behavioral: The patient is nervous/anxious.       Allergies  Review of patient's allergies indicates no known allergies.  Home Medications   Prior to Admission medications   Medication Sig Start Date End Date Taking? Authorizing Provider  acetaminophen (TYLENOL) 500 MG tablet Take 1,000 mg by mouth every 4 (four) hours as needed. For pain   Yes Historical Provider, MD  Aspirin-Acetaminophen-Caffeine 260-130-16 MG TABS Take 1 packet by mouth as needed (head aches).   Yes Historical Provider, MD  clonazePAM (KLONOPIN) 0.5 MG tablet Take 0.5 mg by mouth daily.   Yes Historical Provider, MD  traZODone (DESYREL) 100 MG tablet Take 100 mg by mouth at bedtime.     Yes Historical Provider, MD   BP 166/77  Pulse 73  Temp(Src) 97.9 F (36.6 C) (Oral)  Resp 18  SpO2 99% Physical Exam  Nursing note and vitals reviewed. Constitutional: He is oriented to person, place, and time. He appears well-developed and well-nourished. No distress.  HENT:  Head: Atraumatic.  Mouth/Throat: Oropharynx is clear and moist.  Eyes: Conjunctivae are normal. Pupils are equal, round, and reactive to light.  Neck: Neck supple. No tracheal deviation present.  Cardiovascular: Normal rate, regular rhythm, normal heart sounds and intact distal pulses.   Pulmonary/Chest: Effort normal and breath sounds normal. No accessory muscle usage. No respiratory distress.  Abdominal: Soft. Bowel sounds  are normal. He exhibits no distension. There is no tenderness.  Musculoskeletal: Normal range of motion. He exhibits no edema and no tenderness.  Neurological: He is alert and oriented to person, place, and time.  No tremor or shakes.   Skin: Skin is warm and dry. He is not diaphoretic.  Psychiatric: He has a normal mood and affect.     ED Course  Procedures (including critical care time) Labs Review    MDM  Labs.  Pt requests inpatient rehab/detox - psych team called.  Disposition per psych team.  Reviewed nursing notes and prior charts for additional history.   1520 - labs and psych eval pending, pt signed out to Dr Doy Mince.    Mirna Mires, MD 05/14/14 [phone]

## 2014-05-14 NOTE — ED Notes (Signed)
Pt ambulatory w/o difficulty to room

## 2014-05-14 NOTE — ED Notes (Signed)
Patient stating, while looking at another patient, "He looks like [place] ([place]). I don't trust anyone with slanty eyes." Patient reassured other patient is not [place] and that he is safe here.

## 2014-05-14 NOTE — ED Notes (Signed)
Bed: [facility] Expected date:  Expected time:  Means of arrival:  Comments: Section closed

## 2014-05-14 NOTE — ED Notes (Signed)
Pt c/o of anxiety and alcohol problems. States that he drinks about 12 beers a day. Denies HI/SI.

## 2014-05-14 NOTE — BH Assessment (Signed)
Assessment Note  David Lucero is an 68 y.o. male. Pt presents voluntarily to [facility] for alcohol detox. Per chart review, pt was inpatient at [facility] July 2011 and Sept 2012. Pt denies SI and HI. He denies [facility] and no delusions noted. Pt reports several previous detoxes. He sts he drinks approx. twelve 12-oz beers daily. He sts he drank one 12-oz beer this am. Pt reports current withdrawal symptom of nausea and he sts his skin feels like it is crawling. He describes mood as "fair". Affect is blunted. Pt oriented x 4 and is cooperative. He is hard of hearing but he doesn't use hearing aids. Pt denies depressive sxs. He sts he lives w/ his brother.  Pt denies hx of seizures and denies hx of DTs. Pt sts he is on "100% disability for PTSD". Pt sts he used to go to the [place] but quit going.   Axis I: Alcohol Use Disorder, Severe Axis II: Deferred Axis III:  Past Medical History  Diagnosis Date  . Anxiety   . Cancer   . PTSD (post-traumatic stress disorder)   . PAD (peripheral artery disease)   . Lung cancer   . COPD (chronic obstructive pulmonary disease)    Axis IV: other psychosocial or environmental problems and problems related to social environment Axis V: 41-50 serious symptoms  Past Medical History:  Past Medical History  Diagnosis Date  . Anxiety   . Cancer   . PTSD (post-traumatic stress disorder)   . PAD (peripheral artery disease)   . Lung cancer   . COPD (chronic obstructive pulmonary disease)     History reviewed. No pertinent past surgical history.  Family History:  Family History  Problem Relation Age of Onset  . Heart attack Father     Social History:  reports that he has been smoking Cigarettes.  He has been smoking about 0.00 packs per day for the past 30 years. He does not have any smokeless tobacco history on file. He reports that he drinks about 2.4 ounces of alcohol per week. He reports that he does not use illicit drugs.  Additional Social History:  Alcohol /  Drug Use Pain Medications: see PTA meds list - pt denies abuse Prescriptions: see PTA meds list - pt denies abuse Over the Counter: see PTA meds list - pt denies abuse History of alcohol / drug use?: Yes Negative Consequences of Use: Financial;Work / School Withdrawal Symptoms: Nausea / Vomiting (skin crawling) Substance #1 Name of Substance 1: alcohol 1 - Age of First Use: 12 1 - Amount (size/oz): twelve 12-oz beers 1 - Frequency: daily 1 - Duration: years 1 - Last Use / Amount: 05/14/14 - one 12-oz beer  CIWA: CIWA-Ar BP: 170/89 mmHg Pulse Rate: 64 Nausea and Vomiting: no nausea and no vomiting Tactile Disturbances: very mild itching, pins and needles, burning or numbness Tremor: no tremor Auditory Disturbances: not present Paroxysmal Sweats: no sweat visible Visual Disturbances: not present Anxiety: two Headache, Fullness in Head: none present Agitation: normal activity Orientation and Clouding of Sensorium: oriented and can do serial additions CIWA-Ar Total: 3 COWS:    Allergies: No Known Allergies  Home Medications:  (Not in a hospital admission)  OB/GYN Status:  No LMP for male patient.  General Assessment Data Location of Assessment: [facility] ED Is this a Tele or Face-to-Face Assessment?: Face-to-Face Is this an Initial Assessment or a Re-assessment for this encounter?: Initial Assessment Living Arrangements: Other relatives (brother) Can pt return to current living arrangement?:  Yes Admission Status: Voluntary Is patient capable of signing voluntary admission?: Yes Transfer from: Home Referral Source: Self/Family/Friend     [place] Living Arrangements: Other relatives (brother) Name of Psychiatrist: none Name of Therapist: none  Education Status Is patient currently in school?: No Highest grade of school patient has completed: 27 (pt received GED in [employer])  Risk to self with the past 6 months Suicidal Ideation: No Suicidal Intent: No Is  patient at risk for suicide?: No Suicidal Plan?: No Access to Means: No What has been your use of drugs/alcohol within the last 12 months?: daily alcohol use Previous Attempts/Gestures: No How many times?: 0 Other Self Harm Risks: none Triggers for Past Attempts:  (n/a) Intentional Self Injurious Behavior: None Family Suicide History: No Recent stressful life event(s):  (n/a) Persecutory voices/beliefs?: No Depression: No Substance abuse history and/or treatment for substance abuse?: Yes Suicide prevention information given to non-admitted patients: Not applicable  Risk to Others within the past 6 months Homicidal Ideation: No Thoughts of Harm to Others: No Current Homicidal Intent: No Current Homicidal Plan: No Access to Homicidal Means: No Identified Victim: none History of harm to others?: No Assessment of Violence: None Noted Violent Behavior Description: pt cooperative Does patient have access to weapons?: No Criminal Charges Pending?: No Does patient have a court date: No  Psychosis Hallucinations: None noted Delusions: None noted  Mental Status Report Appear/Hygiene: In hospital gown;Unremarkable Eye Contact: Good Motor Activity: Freedom of movement Speech: Logical/coherent;Loud Level of Consciousness: Alert Mood: Other (Comment) ("fair") Affect: Appropriate to circumstance;Blunted Anxiety Level: Minimal Thought Processes: Relevant;Coherent Judgement: Unimpaired Orientation: Person;Place;Situation;Time Obsessive Compulsive Thoughts/Behaviors: None  Cognitive Functioning Concentration: Decreased Memory: Remote Intact;Recent Intact IQ: Average Insight: Fair Impulse Control: Good Appetite: Fair Sleep: No Change Total Hours of Sleep: 7 Vegetative Symptoms: None  ADLScreening [facility] Assessment Services) Patient's cognitive ability adequate to safely complete daily activities?: Yes Patient able to express need for assistance with ADLs?: Yes Independently  performs ADLs?: Yes (appropriate for developmental age)  Prior Inpatient Therapy Prior Inpatient Therapy: Yes Prior Therapy Dates: 2011 & 2012 Prior Therapy Facilty/Provider(s): [facility] Reason for Treatment: alcohol detox  Prior Outpatient Therapy Prior Outpatient Therapy: Yes Prior Therapy Dates: in the past [facility]  ADL Screening (condition at time of admission) Patient's cognitive ability adequate to safely complete daily activities?: Yes Is the patient deaf or have difficulty hearing?: No Does the patient have difficulty seeing, even when wearing glasses/contacts?: No Does the patient have difficulty concentrating, remembering, or making decisions?: No Patient able to express need for assistance with ADLs?: Yes Does the patient have difficulty dressing or bathing?: No Independently performs ADLs?: Yes (appropriate for developmental age) Does the patient have difficulty walking or climbing stairs?: No Weakness of Legs: None Weakness of Arms/Hands: None  Home Assistive Devices/Equipment Home Assistive Devices/Equipment: None (reading glasses)    Abuse/Neglect Assessment (Assessment to be complete while patient is alone) Physical Abuse: Denies Verbal Abuse: Denies Sexual Abuse: Denies Exploitation of patient/patient's resources: Denies Self-Neglect: Denies Values / Beliefs Cultural Requests During Hospitalization: None Spiritual Requests During Hospitalization: None   Advance Directives (For Healthcare) Advance Directive: Patient does not have advance directive    Additional Information 1:1 In Past 12 Months?: No CIRT Risk: No Elopement Risk: No Does patient have medical clearance?: Yes     Disposition:  Disposition Initial Assessment Completed for this Encounter: Yes Disposition of Patient: Inpatient treatment program Type of inpatient treatment program: Adult (detox)  On Site  Evaluation by:   Reviewed with Physician:     Leron Croak P 05/14/2014 6:10 PM

## 2014-05-15 NOTE — Discharge Instructions (Signed)
°Emergency Department Resource Guide °1) Find a Doctor and Pay Out of Pocket °Although you won't have to find out who is covered by your insurance plan, it is a good idea to ask around and get recommendations. You will then need to call the office and see if the doctor you have chosen will accept you as a new patient and what types of options they offer for patients who are self-pay. Some doctors offer discounts or will set up payment plans for their patients who do not have insurance, but you will need to ask so you aren't surprised when you get to your appointment. ° °2) Contact Your Local Health Department °Not all health departments have doctors that can see patients for sick visits, but many do, so it is worth a call to see if yours does. If you don't know where your local health department is, you can check in your phone book. The CDC also has a tool to help you locate your state's health department, and many state websites also have listings of all of their local health departments. ° °3) Find a Walk-in Clinic °If your illness is not likely to be very severe or complicated, you may want to try a walk in clinic. These are popping up all over the country in pharmacies, drugstores, and shopping centers. They're usually staffed by nurse practitioners or physician assistants that have been trained to treat common illnesses and complaints. They're usually fairly quick and inexpensive. However, if you have serious medical issues or chronic medical problems, these are probably not your best option. ° °No Primary Care Doctor: °- Call [facility] at  832-8000 - they can help you locate a primary care doctor that  accepts your insurance, provides certain services, etc. °- Physician Referral Service- 1-800-533-3463 ° °Chronic Pain Problems: °Organization         Address  Phone   Notes  °[facility]  (336) 297-2271 Patients need to be referred by their primary care doctor.  ° °Medication  Assistance: °Organization         Address  Phone   Notes  °[place] [facility] [address] °[place], [place] [address] (336) 641-8030 --Must be a resident of [place] °-- Must have NO insurance coverage whatsoever (no Medicaid/ Medicare, etc.) °-- The pt. MUST have a primary care doctor that directs their care regularly and follows them in the community °  °[facility]  (866) 331-1348   °[employer]  (888) 892-1162   ° °Agencies that provide inexpensive medical care: °Organization         Address  Phone   Notes  °[facility]  (336) 832-8035   °[facility]    (336) 832-7272   °[facility] [address] °[place], [place] [address] (336) 832-4777   °[facility] [address], °[place] (336) 271-4999   °[facility]    (336) 373-0678   °[facility]    (336) 272-1050   °[facility] ° [address], [place] Phone:  (336) 832-4444, Fax:  (336) 832-4440 Hours of Operation:  9 am - 6 pm, M-F.  Also accepts Medicaid/Medicare and self-pay.  °[facility] ° [address],  Phone: (336) 832-3150, Fax: (336) 832-3151. Hours of Operation:  8:30 am - 5:30 pm, M-F.  Also accepts Medicaid and self-pay.  °[facility] [place] [address], [place] Phone: (336) 878-6027   °[facility] [address], [place], [place] (336)723-1848, Ext. 123 Mondays & Thursdays: 7-9 AM.  First 15 patients are seen on a first come, first serve basis. °  ° °Medicaid-accepting [place] Providers: ° °Organization         Address  Phone   Notes  °[facility] [address], [place] (336) 641-2100 Also accepts self-pay patients.  °[facility] [address], [place] ° (336) 856-9996   °[facility] [address], [place]  (336) 288-8857   °[facility] [address] [place] [address], [place] (336) 299-7000   °Veita Bland [address], [place]  ° (336) 373-1557 Only accepts [place] Access Medicaid patients after they have their name applied to their card.  ° °Self-Pay (no insurance) in [place]: ° °Organization         Address  Phone   Notes  °Sickle Cell Patients, [facility] [address], [place] (336) 832-1970   °[facility] [address], [place] (336) 832-4400   °[facility] ° [address],  (336) 992-4800   °[facility]/Dr. Osei-Bonsu ° [address] [place] [address], [place] or [address], [place] (336) 841-8500 Phone number for both [place] and [place] locations is the same.  °[facility] [address], [place] (336) 299-0000   °[facility] [address] [place] [address], [place] or [address] (336) 852-7530 °(336) 878-2260   °[facility] [address], [place] (336) 350-1642, phone; (336) 294-5005, fax Sees patients 1st and 3rd Saturday of every month.  Must not qualify for public or private insurance (i.e. Medicaid, Medicare, [place] Health Choice, Veterans' Benefits) • Household income should be no more than 200% of the poverty level •The clinic cannot treat you if you are pregnant or think you are pregnant • Sexually transmitted diseases are not treated at the clinic.  ° ° °Dental Care: °Organization         Address  Phone  Notes  °[place] [facility] [address],  (336) 641-6152 Accepts children up to age 21 who are enrolled in Medicaid or [place] Health Choice; pregnant women with a Medicaid card; and children who have applied for Medicaid or [place] Health Choice, but were declined, whose parents can pay a reduced fee at time of service.  °[place]  [facility] [place]  [address], [place] (336) 641-7733 Accepts children up to age 21 who are enrolled in Medicaid or [place] Health Choice; pregnant women with a Medicaid card; and children who have applied for Medicaid or  Health Choice, but were declined, whose parents can pay a reduced fee at time of service.  °[facility] ° [address],  (336) 641-4533 Patients are seen by appointment only. Walk-ins are not accepted. [facility] will see patients 18 years of age and older. °Monday - Tuesday (8am-5pm) °Most Wednesdays (8:30-5pm) °$30 per visit, cash only  °[facility] ° [address], [place] (336) 641-4533 Patients are seen by appointment only. Walk-ins are not accepted. [facility] will see patients 18 years of age and older. °One   Wednesday Evening (Monthly: Volunteer Based).  $30 per visit, cash only  °[facility]  (919) 537-3737 for adults; Children under age 4, call Graduate Pediatric Dentistry at (919) [phone]. Children aged 4-14, please call (919) [phone] to request a pediatric application. ° Dental services are provided in all areas of dental care including fillings, crowns and bridges, complete and partial dentures, implants, gum treatment, root canals, and extractions. Preventive care is also provided. Treatment is provided to both adults and children. °Patients are selected via a lottery and there is often a waiting list. °  °[facility] [address], °[place] ° (336) 763-8833 [email or web address] °  °[facility] [address], [place], [place] (336)723-1848, Ext. 123 Second and Fourth Thursday of each month, opens at 6:30 AM; Clinic ends at 9 AM.  Patients are seen on a first-come first-served basis, and a limited number are seen during each clinic.  ° °[facility] ° [address], [place], [place] (336) 723-7904    Eligibility Requirements °You must have lived in [place], or [place] counties for at least the last three months. °  You cannot be eligible for state or federal sponsored healthcare insurance, including Veterans Administration, Medicaid, or Medicare. °  You generally cannot be eligible for healthcare insurance through your employer.  °  How to apply: °Eligibility screenings are held every Tuesday and Wednesday afternoon from 1:00 pm until 4:00 pm. You do not need an appointment for the interview!  °[facility] [address], [place], [place] 336-631-2330   °[facility]  336-342-8273   °[facility]  336-703-3100   °[facility]  336-570-6415   ° °Behavioral Health Resources in the Community: °Intensive Outpatient Programs °Organization         Address  Phone  Notes  °[facility] [address], [place], [place] 336-878-6098   °[facility] [address], [place], [place] 336-832-9800   °[facility] [address], [place], [place] ° 336-882-2125   °[facility] [address],  °[place], [place] 1-800-853-5163 or 336-641-4981   °Substance Abuse Resources °Organization         Address  Phone  Notes  °[facility]  336-882-2125   °[facility]  336-784-9470   °[facility]  336-285-9073   °[facility]  336-845-3988   °Residential & Outpatient Substance Abuse Program  1-800-659-3381   °Psychological Services °Organization         Address  Phone  Notes  °[facility]  336- 832-9600   °[facility]  336- 378-7881   °[facility] [address], [place] 1-800-853-5163 or 336-641-4981   ° °Mobile Crisis Teams °Organization         Address  Phone  Notes  °[facility], Mobile Crisis Care Unit  1-877-626-1772   °Assertive °[facility] ° [address]  [place], [place] 336-834-9664   °Sharon DeEsch [address] °[place] 336-554-5454   ° °Self-Help/Support Groups °Organization         Address  Phone             Notes  °[facility] - variety of support groups  336- 373-1402 Call for more information  °Narcotics Anonymous (NA), [facility] [address], °[place]   2 meetings at this location  ° °  Residential Treatment Programs °Organization         Address  Phone  Notes  °[facility] [address],    °[place]  1-866-801-8205   °[facility] ° [address], [place], [place] 704-293-8524   °[facility] [address], [place] 336-845-3988 Admissions: 8am-3pm M-F  °[facility] [address]    °[place], [place] 336-841-1104   °[facility] [address], [place], [place] 336-379-7146   °[facility] [address]  °[place], [place] 336-285-9073   °[facility] Intensive Outpatient [address], [place], [place] 336-852-3033   °[facility] [address]  °[place], [place] 1-877-615-2722 or 336-784-9470   °[facility] [address] [place], [place] 336-227-7417 Accepts Medicaid  °[facility] [address]  °[place] 1-800-659-3381 Substance Abuse/Addiction Treatment  ° °[place] Behavioral Health Resources °Organization         Address  Phone  Notes  °[facility]  (888) 581-9988   °Julie Brannon, PhD [address] [place], [place]   (336) 349-5553 or (336) 951-0000   °[facility]   [address] °[place], [place] (336) 349-4454   °[facility] [address], [place], [place] (336) 342-8316 Insurance/Medicaid/sponsorship through [facility]  °[facility] [address]                                    [place], [place] (336) 342-8316 Therapy/tele-psych/case    °[facility] [address]  ° [place], [place] (336) 349-2233    °Dr. Arfeen  (336) 349-4544   °[facility] [place]  [employer] [place] [facility] 1) [address],  °2) [address], [place] °3)  [address], [place] (336) 349-3220 °(336) 342-7768 ° °(336) 342-8140   °[place] [facility] (336) 342-1394 or (336) 342-3537 (After Hours)    ° ° °

## 2014-05-15 NOTE — ED Provider Notes (Signed)
7:48 AM Pt in NAD.  Not diaphoretic or tremulous and is not actively withdrawing. He reports he only had 1 beer yesterday, 6-8 the day before. He has not endorsed SI or HI durint stay. He has no hx of seizures to DTs. I do not believe he requires inpt ETOH detox.   We have gone over outpaitent resources including residential treatment centers.   1. Alcohol abuse        Neta Ehlers, MD 05/15/14 [phone]

## 2014-05-15 NOTE — ED Notes (Signed)
D/C instructions/meds/follow-up appointments reviewed, pt verbalized understanding, pt's belongings returned to pt, Pt is going to drive himself to [place] [facility], Follow up address and phone number given to pt.

## 2014-05-15 NOTE — Progress Notes (Addendum)
Per Elmyra Ricks, RN pt has been accepted to [facility] to arrive after 8am.  He was accepted by Dr. Sarajane Jews under condition has own transportation back from facility, which Denman George, RN confirmed prior to acceptance.  RN report#[phone].  Wyvonnia Dusky, MHT/NS

## 2014-05-15 NOTE — BH Assessment (Signed)
Consulted with EDP Ernestina Patches who is discharging patient from ED with outpatient SA referrals and [facility] information. This Probation officer provided patient's nurse SA, Crisis Resources and [facility] information to be provided to patient prior to D/C from ED. Per patient's ED nurse report patient is going to drive himself to [place] after d/c from ED for treatment.This Probation officer notified [facility] crisis [facility] detox unit.  Staff and spoke with  Ms.Chambers who transferred me to detox/crisis line, no answer this Probation officer left a message informing them that patient will present for treatment as he was previously accepted for detox treatment.  Shaune Pollack, MS, [place] Assessment Counselor

## 2015-06-30 ENCOUNTER — Encounter (HOSPITAL_COMMUNITY): Payer: Self-pay | Admitting: Emergency Medicine

## 2015-06-30 ENCOUNTER — Emergency Department (HOSPITAL_COMMUNITY)
Admission: EM | Admit: 2015-06-30 | Discharge: 2015-06-30 | Disposition: A | Discharge status: Home / Self Care | Payer: Medicare Other | Attending: Emergency Medicine | Admitting: Emergency Medicine

## 2015-06-30 DIAGNOSIS — Z720 Tobacco use: Secondary | ICD-10-CM | POA: Diagnosis not present

## 2015-06-30 DIAGNOSIS — F419 Anxiety disorder, unspecified: Secondary | ICD-10-CM | POA: Insufficient documentation

## 2015-06-30 DIAGNOSIS — Z79899 Other long term (current) drug therapy: Secondary | ICD-10-CM | POA: Insufficient documentation

## 2015-06-30 DIAGNOSIS — L299 Pruritus, unspecified: Secondary | ICD-10-CM | POA: Insufficient documentation

## 2015-06-30 DIAGNOSIS — Z8679 Personal history of other diseases of the circulatory system: Secondary | ICD-10-CM | POA: Insufficient documentation

## 2015-06-30 DIAGNOSIS — J449 Chronic obstructive pulmonary disease, unspecified: Secondary | ICD-10-CM | POA: Diagnosis not present

## 2015-06-30 DIAGNOSIS — Z85118 Personal history of other malignant neoplasm of bronchus and lung: Secondary | ICD-10-CM | POA: Insufficient documentation

## 2015-06-30 MED ORDER — LORAZEPAM 2 MG/ML IJ SOLN
0.5000 mg | Freq: Once | INTRAMUSCULAR | Status: AC
  Administered 2015-06-30: 0.5 mg via INTRAMUSCULAR
  Filled 2015-06-30: qty 1

## 2015-06-30 MED ORDER — HYDROXYZINE HCL 25 MG PO TABS: 25.0000 mg | ORAL_TABLET | Freq: Four times a day (QID) | ORAL | Status: DC

## 2015-06-30 MED ORDER — HYDROXYZINE HCL 25 MG PO TABS
25.0000 mg | ORAL_TABLET | Freq: Once | ORAL | Status: AC
  Administered 2015-06-30: 25 mg via ORAL
  Filled 2015-06-30: qty 1

## 2015-06-30 NOTE — ED Provider Notes (Signed)
CSN: [id number]     Arrival date & time 06/30/15  1641 History   First MD Initiated Contact with Patient 06/30/15 1833     Chief Complaint  Patient presents with  . Pruritis     (Consider location/radiation/quality/duration/timing/severity/associated sxs/prior Treatment) HPI Comments: Patient presents to the ER for evaluation of itching. Patient reports that this has been ongoing for 6 or 8 months. He reports that his doctor has given him pills and creams but it has not helped. He reports seeing a dermatologist without improvement. Patient reports that the itching is worse today. No tongue swelling, throat swelling, difficulty breathing.   Past Medical History  Diagnosis Date  . Anxiety   . Cancer   . PTSD (post-traumatic stress disorder)   . PAD (peripheral artery disease)   . Lung cancer   . COPD (chronic obstructive pulmonary disease)    History reviewed. No pertinent past surgical history. Family History  Problem Relation Age of Onset  . Heart attack Father    Social History  Substance Use Topics  . Smoking status: Current Every Day Smoker -- 30 years    Types: Cigarettes  . Smokeless tobacco: None  . Alcohol Use: 2.4 oz/week    4 Cans of beer per week    Review of Systems  Skin: Positive for rash.  All other systems reviewed and are negative.     Allergies  Review of patient's allergies indicates no known allergies.  Home Medications   Prior to Admission medications   Medication Sig Start Date End Date Taking? Authorizing Provider  acetaminophen (TYLENOL) 500 MG tablet Take 1,000 mg by mouth every 4 (four) hours as needed. For pain    Historical Provider, MD  Aspirin-Acetaminophen-Caffeine 260-130-16 MG TABS Take 1 packet by mouth as needed (head aches).    Historical Provider, MD  clonazePAM (KLONOPIN) 0.5 MG tablet Take 0.5 mg by mouth daily.    Historical Provider, MD  traZODone (DESYREL) 100 MG tablet Take 100 mg by mouth at bedtime.      Historical  Provider, MD   BP 127/83 mmHg  Pulse 74  Temp(Src) 97.6 F (36.4 C) (Oral)  Resp 18  SpO2 100% Physical Exam  Constitutional: He is oriented to person, place, and time. He appears well-developed and well-nourished. No distress.  HENT:  Head: Normocephalic and atraumatic.  Right Ear: Hearing normal.  Left Ear: Hearing normal.  Nose: Nose normal.  Mouth/Throat: Oropharynx is clear and moist and mucous membranes are normal.  Eyes: Conjunctivae and EOM are normal. Pupils are equal, round, and reactive to light.  Neck: Normal range of motion. Neck supple.  Cardiovascular: Regular rhythm, S1 normal and S2 normal.  Exam reveals no gallop and no friction rub.   No murmur heard. Pulmonary/Chest: Effort normal and breath sounds normal. No respiratory distress. He exhibits no tenderness.  Abdominal: Soft. Normal appearance and bowel sounds are normal. There is no hepatosplenomegaly. There is no tenderness. There is no rebound, no guarding, no tenderness at McBurney's point and negative Murphy's sign. No hernia.  Musculoskeletal: Normal range of motion.  Neurological: He is alert and oriented to person, place, and time. He has normal strength. No cranial nerve deficit or sensory deficit. Coordination normal. GCS eye subscore is 4. GCS verbal subscore is 5. GCS motor subscore is 6.  Skin: Skin is warm, dry and intact. No rash noted. No cyanosis.  Multiple scattered excoriations without erythema, induration or signs of infection. No drainage. There is no scleral icterus or  jaundice.  Psychiatric: He has a normal mood and affect. His speech is normal and behavior is normal. Thought content normal.  Nursing note and vitals reviewed.   ED Course  Procedures (including critical care time) Labs Review Labs Reviewed - No data to display  Imaging Review No results found. I have personally reviewed and evaluated these images and lab results as part of my medical decision-making.   EKG  Interpretation None      MDM   Final diagnoses:  None   pruritus  Presents to the emergency department for itching. Patient reports that this has been ongoing for 6-8 months. He has multiple excoriations, but no discernible rash. He does have a history of alcohol abuse, but there is no jaundice. Recent labs did not show any LFT abnormalities. Abdominal exam is benign, no ascites or liver enlargement. There is no sign of cellulitis or infection. Patient does not require workup at this time. He will be treated symptomatically for itching, referred back to his primary doctor.    Orpah Greek, MD 06/30/15 [phone]

## 2015-06-30 NOTE — Discharge Instructions (Signed)
Pruritus  °Pruritus is an itch. There are many different problems that can cause an itch. Dry skin is one of the most common causes of itching. Most cases of itching do not require medical attention.  °HOME CARE INSTRUCTIONS  °Make sure your skin is moistened on a regular basis. A moisturizer that contains petroleum jelly is best for keeping moisture in your skin. If you develop a rash, you may try the following for relief:  °· Use corticosteroid cream. °· Apply cool compresses to the affected areas. °· Bathe with Epsom salts or baking soda in the bathwater. °· Soak in colloidal oatmeal baths. These are available at your pharmacy. °· Apply baking soda paste to the rash. Stir water into baking soda until it reaches a paste-like consistency. °· Use an anti-itch lotion. °· Take over-the-counter diphenhydramine medicine by mouth as the instructions direct. °· Avoid scratching. Scratching may cause the rash to become infected. If itching is very bad, your caregiver may suggest prescription lotions or creams to lessen your symptoms. °· Avoid hot showers, which can make itching worse. A cold shower may help with itching as long as you use a moisturizer after the shower. °SEEK MEDICAL CARE IF: °The itching does not go away after several days. °Document Released: 06/11/2011 Document Revised: 02/13/2014 Document Reviewed: 06/11/2011 °ExitCare® Patient Information ©2015 [employer]. This information is not intended to replace advice given to you by your health care provider. Make sure you discuss any questions you have with your health care provider. ° °

## 2015-06-30 NOTE — ED Notes (Signed)
Per EMS-states itching for 6 months-drinks daily-skin intact

## 2015-07-27 ENCOUNTER — Emergency Department (HOSPITAL_COMMUNITY)
Admission: EM | Admit: 2015-07-27 | Discharge: 2015-07-27 | Disposition: A | Discharge status: Home / Self Care | Payer: Medicare Other | Attending: Emergency Medicine | Admitting: Emergency Medicine

## 2015-07-27 ENCOUNTER — Encounter (HOSPITAL_COMMUNITY): Payer: Self-pay | Admitting: Emergency Medicine

## 2015-07-27 DIAGNOSIS — Z8679 Personal history of other diseases of the circulatory system: Secondary | ICD-10-CM | POA: Insufficient documentation

## 2015-07-27 DIAGNOSIS — Z85118 Personal history of other malignant neoplasm of bronchus and lung: Secondary | ICD-10-CM | POA: Insufficient documentation

## 2015-07-27 DIAGNOSIS — Z79899 Other long term (current) drug therapy: Secondary | ICD-10-CM | POA: Insufficient documentation

## 2015-07-27 DIAGNOSIS — F419 Anxiety disorder, unspecified: Secondary | ICD-10-CM | POA: Insufficient documentation

## 2015-07-27 DIAGNOSIS — Z720 Tobacco use: Secondary | ICD-10-CM | POA: Insufficient documentation

## 2015-07-27 DIAGNOSIS — F10129 Alcohol abuse with intoxication, unspecified: Secondary | ICD-10-CM | POA: Diagnosis present

## 2015-07-27 DIAGNOSIS — F1010 Alcohol abuse, uncomplicated: Secondary | ICD-10-CM | POA: Insufficient documentation

## 2015-07-27 DIAGNOSIS — J449 Chronic obstructive pulmonary disease, unspecified: Secondary | ICD-10-CM | POA: Insufficient documentation

## 2015-07-27 DIAGNOSIS — R21 Rash and other nonspecific skin eruption: Secondary | ICD-10-CM | POA: Diagnosis not present

## 2015-07-27 MED ORDER — CHLORDIAZEPOXIDE HCL 25 MG PO CAPS: ORAL_CAPSULE | ORAL | Status: DC

## 2015-07-27 MED ORDER — HYDROXYZINE HCL 25 MG PO TABS
25.0000 mg | ORAL_TABLET | Freq: Once | ORAL | Status: AC
  Administered 2015-07-27: 25 mg via ORAL
  Filled 2015-07-27: qty 1

## 2015-07-27 NOTE — ED Provider Notes (Signed)
Patient presented to the ER with alcohol problem. Patient with long history of alcoholism requesting detox.  Face to face Exam: HEENT - PERRLA Lungs - CTAB Heart - RRR, no M/R/G Abd - S/NT/ND Neuro - alert, oriented x3  Plan: Vital signs are stable. Does not appear to be in withdrawal at this time, last drink was 7 hours ago. Provide librium taper, resources for outpatient detox.  Orpah Greek, MD 07/27/15 [phone]

## 2015-07-27 NOTE — ED Notes (Signed)
Bed: WA09 Expected date:  Expected time:  Means of arrival:  Comments: EMS- 69yo M, ETOH Detox

## 2015-07-27 NOTE — ED Provider Notes (Signed)
CSN: [id number]     Arrival date & time 07/27/15  3149 History   First MD Initiated Contact with Patient 07/27/15 0845     Chief Complaint  Patient presents with  . Alcohol Problem     (Consider location/radiation/quality/duration/timing/severity/associated sxs/prior Treatment) HPI    David Lucero is a 69 y.o. male with past medical history significant for anxiety, PTSD, lung cancer and COPD requesting detox from alcohol, states that he's drinking heavily for years. States that he had a beer last night at approximately 2 AM. Patient states he cannot say why he would like to detox from alcohol today. He is detoxed in the past and never had any seizures since patient's consistent with DTs. He denies any other drug use, suicidal ideation, homicidal ideation, auditory or visual hallucinations. Patient is also requesting pain medication for chronic pruritic rash that he has and crackers.   Past Medical History  Diagnosis Date  . Anxiety   . Cancer ([place])   . PTSD (post-traumatic stress disorder)   . PAD (peripheral artery disease) ([place])   . Lung cancer ([place])   . COPD (chronic obstructive pulmonary disease) ([place])    History reviewed. No pertinent past surgical history. Family History  Problem Relation Age of Onset  . Heart attack Father    Social History  Substance Use Topics  . Smoking status: Current Every Day Smoker -- 1.50 packs/day for 30 years    Types: Cigarettes  . Smokeless tobacco: Never Used  . Alcohol Use: 50.4 oz/week    84 Cans of beer per week    Review of Systems    Allergies  Review of patient's allergies indicates no known allergies.  Home Medications   Prior to Admission medications   Medication Sig Start Date End Date Taking? Authorizing Provider  acetaminophen (TYLENOL) 500 MG tablet Take 1,000 mg by mouth every 4 (four) hours as needed. For pain    Historical Provider, MD  Aspirin-Acetaminophen-Caffeine 260-130-16 MG TABS Take 1 packet by mouth  as needed (head aches).    Historical Provider, MD  clonazePAM (KLONOPIN) 0.5 MG tablet Take 0.5 mg by mouth daily.    Historical Provider, MD  hydrOXYzine (ATARAX/VISTARIL) 25 MG tablet Take 1 tablet (25 mg total) by mouth every 6 (six) hours. 06/30/15   Orpah Greek, MD  traZODone (DESYREL) 100 MG tablet Take 100 mg by mouth at bedtime.      Historical Provider, MD   SpO2 98% Physical Exam  Constitutional: He is oriented to person, place, and time. He appears well-developed and well-nourished. No distress.  HENT:  Head: Normocephalic.  Eyes: Conjunctivae and EOM are normal.  Cardiovascular: Normal rate and regular rhythm.   Pulmonary/Chest: Effort normal and breath sounds normal. No stridor.  Abdominal: Soft. Bowel sounds are normal.  Musculoskeletal: Normal range of motion.  Neurological: He is alert and oriented to person, place, and time.  Skin: Rash noted.  Excoriated rash to bilateral lower extremities, no warmth tenderness palpation or discharge.  Psychiatric: He has a normal mood and affect.  Nursing note and vitals reviewed.   ED Course  Procedures (including critical care time) Labs Review Labs Reviewed - No data to display  Imaging Review No results found. I have personally reviewed and evaluated these images and lab results as part of my medical decision-making.   EKG Interpretation None      MDM   Final diagnoses:  Alcohol abuse  Rash    Filed Vitals:   07/27/15 7026  07/27/15 0858  BP:  148/65  Pulse:  65  Temp:  97.7 F (36.5 C)  TempSrc:  Axillary  Resp:  16  SpO2: 98% 97%    Medications  hydrOXYzine (ATARAX/VISTARIL) tablet 25 mg (not administered)    David Lucero is a pleasant 69 y.o. male requesting alcohol detox, grossly intoxicated at this time. No history of DTs. Patient will be given Librium taper and resource guide to establish outpatient detox. Patient has excoriations, no signs of secondary infection.  Evaluation does  not show pathology that would require ongoing emergent intervention or inpatient treatment. Pt is hemodynamically stable and mentating appropriately. Discussed findings and plan with patient/guardian, who agrees with care plan. All questions answered. Return precautions discussed and outpatient follow up given.   New Prescriptions   CHLORDIAZEPOXIDE (LIBRIUM) 25 MG CAPSULE    '50mg'$  PO TID x 1D, then 25-'50mg'$  PO BID X 1D, then 25-'50mg'$  PO QD X 1D         Monico Blitz, PA-C 07/27/15 [place], MD 07/27/15 [phone]

## 2015-07-27 NOTE — Discharge Instructions (Signed)
Do not hesitate to return to the emergency room for any new, worsening or concerning symptoms.  Please obtain primary care using resource guide below. Let them know that you were seen in the emergency room and that they will need to obtain records for further outpatient management.    Emergency Department Resource Guide 1) Find a Doctor and Pay Out of Pocket Although you won't have to find out who is covered by your insurance plan, it is a good idea to ask around and get recommendations. You will then need to call the office and see if the doctor you have chosen will accept you as a new patient and what types of options they offer for patients who are self-pay. Some doctors offer discounts or will set up payment plans for their patients who do not have insurance, but you will need to ask so you aren't surprised when you get to your appointment.  2) Contact Your Local Health Department Not all health departments have doctors that can see patients for sick visits, but many do, so it is worth a call to see if yours does. If you don't know where your local health department is, you can check in your phone book. The CDC also has a tool to help you locate your state's health department, and many state websites also have listings of all of their local health departments.  3) Find a [facility] If your illness is not likely to be very severe or complicated, you may want to try a walk in clinic. These are popping up all over the country in pharmacies, drugstores, and shopping centers. They're usually staffed by nurse practitioners or physician assistants that have been trained to treat common illnesses and complaints. They're usually fairly quick and inexpensive. However, if you have serious medical issues or chronic medical problems, these are probably not your best option.    Behavioral Health Resources in the Community: Intensive Outpatient Programs Organization         Address  Phone  Notes  [place]. [address], [place], [place], [place] [phone]   [facility] Outpatient [address] [place], [place]   ADS: [facility] [address] [place], [place]   [place] [address],  [place], [place] or [phone]   Substance Abuse Resources Organization         Address  Phone  Notes  [facility] Drug Services  [phone]   [place]  [phone]   [facility] [place]   Chinita Pester  [phone]   Residential & Outpatient Substance Abuse Program  [phone]   Psychological Services Organization         Address  Phone  Notes  [facility]  [place]  [phone]   [place] [address], [place] or [phone]    Mobile Crisis Teams Organization         Address  Phone  Notes  [facility], Mobile Crisis Care Unit  [phone]   [facility]  [address] [place], [place]   [place] [address], [place] [phone]    Self-Help/Support Groups Organization         Address  Phone             Notes  [place]. of [place] - variety of support  groups  [phone] Call for more information  Narcotics Anonymous (NA), [facility] [address], [employer] [place]  2 meetings at this location   Residential Facilities manager         Address  Phone  Notes  [facility] [place],    [place]  [phone]   [facility]  [address] [place] [address]3614, [place], [place]   [place], [place] [phone] Admissions: 8am-3pm M-F  [facility] [place] [address],    [place], [place] [phone]   [facility] [address] [place],  [place], [place]   [facility] [address]  [place], [place]   [facility] - Intensive Outpatient [address], [place], [place]   [facility] ([place].,  [place], [place] [phone] or [phone]   [facility] ([facility]) [address] [place], [place] Accepts Medicaid  [facility] [place] [address],  [place] [phone] Substance Abuse/Addiction Treatment   [facility] Organization         Address  Phone  Notes  [facility]  [phone]   Domenic Schwab, PhD [address] Arlis Porta [place], [place]   [phone] or [phone]   [facility] [place], [place] [phone]   [facility] [address] [place], [place] [phone] Insurance/Medicaid/sponsorship through [facility] and Families [address], [address]                                    [place], [place] [phone] [place] [address], [place] [phone]    Dr. Adele Schilder  [phone]   [facility] 1) [address], [place] 2) [place] 3)  [address], [place] [phone]  [phone]   [place] [phone] or [phone] (After Hours)      Alcohol Abuse and Nutrition Alcohol abuse is any pattern of alcohol consumption that harms your health, relationships, or work. Alcohol abuse can affect how your body breaks down and absorbs nutrients from food by causing your liver to work abnormally. Additionally, many people who abuse alcohol do not eat enough carbohydrates, protein, fat, vitamins, and minerals. This can cause poor nutrition (malnutrition) and a lack of nutrients (nutrient deficiencies), which can  lead to further complications. Nutrients that are commonly lacking (deficient) among people who abuse alcohol include:  Vitamins.  Vitamin A. This is stored in your liver. It is important for your vision, metabolism, and ability to fight off infections (immunity).  B vitamins. These include vitamins such as folate, thiamin, and niacin. These are important in new cell growth and maintenance.  Vitamin C. This plays an important role in iron absorption, wound healing, and immunity.  Vitamin D. This is produced by your liver, but you can also get vitamin D from food. Vitamin D is necessary for your body to absorb and use calcium.  Minerals.  Calcium. This is important for your bones and your heart and blood vessel (cardiovascular) function.  Iron. This is important for blood, muscle,  and nervous system functioning.  Magnesium. This plays an important role in muscle and nerve function, and it helps to control blood sugar and blood pressure.  Zinc. This is important for the normal function of your nervous system and digestive system (gastrointestinal tract). Nutrition is an essential component of therapy for alcohol abuse. Your health care provider or dietitian will work with you to design a plan that can help restore nutrients to your body and prevent potential complications. WHAT IS MY PLAN? Your dietitian may develop a specific diet plan that is based on your condition and any other complications you may have. A diet plan will commonly include:  A balanced diet.  Grains: 6-8 oz per day.  Vegetables: 2-3 cups per day.  Fruits: 1-2 cups per day.  Meat and other protein: 5-6 oz per day.  Dairy: 2-3 cups per day.  Vitamin and mineral supplements. WHAT DO I NEED TO KNOW ABOUT ALCOHOL AND NUTRITION?  Consume foods that are high in antioxidants, such as grapes, berries, nuts, green tea, and dark green and orange vegetables. This can help to counteract some of the stress that is placed  on your liver by consuming alcohol.  Avoid food and drinks that are high in fat and sugar. Foods such as sugared soft drinks, salty snack foods, and candy contain empty calories. This means that they lack important nutrients such as protein, fiber, and vitamins.  Eat frequent meals and snacks. Try to eat 5-6 small meals each day.  Eat a variety of fresh fruits and vegetables each day. This will help you get plenty of water, fiber, and vitamins in your diet.  Drink plenty of water and other clear fluids. Try to drink at least 48-64 oz (1.5-2 L) of water per day.  If you are a vegetarian, eat a variety of protein-rich foods. Pair whole grains with plant-based proteins at meals and snacks to obtain the greatest nutrient benefit from your food. For example, eat rice with beans, put peanut butter on whole-grain toast, or eat oatmeal with sunflower seeds.  Soak beans and whole grains overnight before cooking. This can help your body to absorb the nutrients more easily.  Include foods fortified with vitamins and minerals in your diet. Commonly fortified foods include milk, orange juice, cereal, and bread.  If you are malnourished, your dietitian may recommend a high-protein, high-calorie diet. This may include:  2,000-3,000 calories (kilocalories) per day.  70-100 grams of protein per day.  Your health care provider may recommend a complete nutritional supplement beverage. This can help to restore calories, protein, and vitamins to your body. Depending on your condition, you may be advised to consume this instead of or in addition to meals.  Limit your intake of caffeine. Replace drinks like coffee and black tea with decaffeinated coffee and herbal tea.  Eat a variety of foods that are high in omega fatty acids. These include fish, nuts and seeds, and soybeans. These foods may help your liver to recover and may also stabilize your mood.  Certain medicines may cause changes in your appetite,  taste, and weight. Work with your health care provider and dietitian to make any adjustments to your medicines and diet plan.  Include other healthy lifestyle choices in your daily routine.  Be physically active.  Get enough sleep.  Spend time doing activities that you enjoy.  If you are unable to take in enough food and calories by mouth, your health care provider may recommend a feeding tube. This is  a tube that passes through your nose and throat, directly into your stomach. Nutritional supplement beverages can be given to you through the feeding tube to help you get the nutrients you need.  Take vitamin or mineral supplements as recommended by your health care provider. WHAT FOODS CAN I EAT? Grains Enriched pasta. Enriched rice. Fortified whole-grain bread. Fortified whole-grain cereal. Barley. Brown rice. Quinoa. [place]. Vegetables All fresh, frozen, and canned vegetables. Spinach. Kale. Artichoke. Carrots. Winter squash and pumpkin. Sweet potatoes. Broccoli. Cabbage. Cucumbers. Tomatoes. Sweet peppers. Green beans. Peas. Corn. Fruits All fresh and frozen fruits. Berries. Grapes. Mango. Papaya. Guava. Cherries. Apples. Bananas. Peaches. Plums. Pineapple. Watermelon. Cantaloupe. Oranges. Avocado. Meats and Other Protein Sources Beef liver. Lean beef. Pork. Fresh and canned chicken. Fresh fish. Oysters. Sardines. Canned tuna. Shrimp. Eggs with yolks. Nuts and seeds. Peanut butter. Beans and lentils. Soybeans. Tofu. Dairy Whole, low-fat, and nonfat milk. Whole, low-fat, and nonfat yogurt. Cottage cheese. Sour cream. Hard and soft cheeses. Beverages Water. Herbal tea. Decaffeinated coffee. Decaffeinated green tea. 100% fruit juice. 100% vegetable juice. Instant breakfast shakes. Condiments Ketchup. Mayonnaise. Mustard. Salad dressing. Barbecue sauce. Sweets and Desserts Sugar-free ice cream. Sugar-free pudding. Sugar-free gelatin. Fats and Oils Butter. Vegetable oil, flaxseed oil,  olive oil, and walnut oil. Other Complete nutrition shakes. Protein bars. Sugar-free gum. The items listed above may not be a complete list of recommended foods or beverages. Contact your dietitian for more options. WHAT FOODS ARE NOT RECOMMENDED? Grains Sugar-sweetened breakfast cereals. Flavored instant oatmeal. Fried breads. Vegetables Breaded or deep-fried vegetables. Fruits Dried fruit with added sugar. Candied fruit. Canned fruit in syrup. Meats and Other Protein Sources Breaded or deep-fried meats. Dairy Flavored milks. Fried cheese curds or fried cheese sticks. Beverages Alcohol. Sugar-sweetened soft drinks. Sugar-sweetened tea. Caffeinated coffee and tea. Condiments Sugar. Honey. Agave nectar. Molasses. Sweets and Desserts Chocolate. Cake. Cookies. Candy. Other Potato chips. Pretzels. Salted nuts. Candied nuts. The items listed above may not be a complete list of foods and beverages to avoid. Contact your dietitian for more information.   This information is not intended to replace advice given to you by your health care provider. Make sure you discuss any questions you have with your health care provider.   Document Released: 07/24/2005 Document Revised: 10/20/2014 Document Reviewed: 05/02/2014 Elsevier Interactive Patient Education [employer].

## 2015-07-27 NOTE — ED Notes (Addendum)
Pt reports drinking since coming home from the [place] war in [place]. Reports drinking 10-12 beers QD. Requesting detox. Denies drug use. Denies SI/HI. Reports last drink was at 0200 this morning.

## 2015-08-21 ENCOUNTER — Ambulatory Visit (HOSPITAL_COMMUNITY)
Admission: RE | Admit: 2015-08-21 | Discharge: 2015-08-21 | Disposition: A | Discharge status: Home / Self Care | Payer: Medicare Other | Source: Ambulatory Visit | Attending: Dermatology | Admitting: Dermatology

## 2015-08-21 ENCOUNTER — Other Ambulatory Visit (HOSPITAL_COMMUNITY)
Admission: RE | Admit: 2015-08-21 | Discharge: 2015-08-21 | Disposition: A | Discharge status: Home / Self Care | Payer: Medicare Other | Source: Ambulatory Visit | Attending: Dermatology | Admitting: Dermatology

## 2015-08-21 ENCOUNTER — Other Ambulatory Visit (HOSPITAL_COMMUNITY): Payer: Self-pay | Admitting: Dermatology

## 2015-08-21 DIAGNOSIS — R0602 Shortness of breath: Secondary | ICD-10-CM | POA: Insufficient documentation

## 2015-08-21 DIAGNOSIS — E639 Nutritional deficiency, unspecified: Secondary | ICD-10-CM | POA: Diagnosis not present

## 2015-08-21 DIAGNOSIS — L989 Disorder of the skin and subcutaneous tissue, unspecified: Secondary | ICD-10-CM | POA: Insufficient documentation

## 2015-08-21 LAB — VITAMIN B12: Vitamin B-12: 669 pg/mL (ref 180–914)

## 2015-08-22 LAB — FOLATE: Folate: 15.2 ng/mL (ref >5.9)

## 2015-08-22 LAB — VITAMIN B1: Vitamin B1 (Thiamine): 171.5 nmol/L (ref 66.5–200.0)

## 2015-08-24 LAB — MISC LABCORP TEST (SEND OUT): Labcorp test code: 70115

## 2015-08-24 LAB — ZINC: ZINC: 78 ug/dL (ref 56–134)

## 2015-11-02 ENCOUNTER — Encounter (HOSPITAL_COMMUNITY): Payer: Self-pay | Admitting: Emergency Medicine

## 2015-11-02 ENCOUNTER — Emergency Department (HOSPITAL_COMMUNITY)
Admission: EM | Admit: 2015-11-02 | Discharge: 2015-11-02 | Disposition: A | Discharge status: Home / Self Care | Payer: Medicare Other | Attending: Emergency Medicine | Admitting: Emergency Medicine

## 2015-11-02 DIAGNOSIS — Z8679 Personal history of other diseases of the circulatory system: Secondary | ICD-10-CM | POA: Diagnosis not present

## 2015-11-02 DIAGNOSIS — J449 Chronic obstructive pulmonary disease, unspecified: Secondary | ICD-10-CM | POA: Diagnosis not present

## 2015-11-02 DIAGNOSIS — Z79899 Other long term (current) drug therapy: Secondary | ICD-10-CM | POA: Insufficient documentation

## 2015-11-02 DIAGNOSIS — Z85118 Personal history of other malignant neoplasm of bronchus and lung: Secondary | ICD-10-CM | POA: Diagnosis not present

## 2015-11-02 DIAGNOSIS — F419 Anxiety disorder, unspecified: Secondary | ICD-10-CM

## 2015-11-02 DIAGNOSIS — F17210 Nicotine dependence, cigarettes, uncomplicated: Secondary | ICD-10-CM | POA: Diagnosis not present

## 2015-11-02 MED ORDER — LORAZEPAM 1 MG PO TABS
1.0000 mg | ORAL_TABLET | Freq: Once | ORAL | Status: AC
  Administered 2015-11-02: 1 mg via ORAL
  Filled 2015-11-02: qty 1

## 2015-11-02 MED ORDER — LORAZEPAM 1 MG PO TABS: 1.0000 mg | ORAL_TABLET | Freq: Three times a day (TID) | ORAL | Status: DC | PRN

## 2015-11-02 NOTE — ED Provider Notes (Signed)
CSN: [id number]     Arrival date & time 11/02/15  0741 History   First MD Initiated Contact with Patient 11/02/15 0745     Chief Complaint  Patient presents with  . anxious      (Consider location/radiation/quality/duration/timing/severity/associated sxs/prior Treatment) HPI  Pt with hx of anxiety presentnig with increasing anxiety after running out of ativan 1 week ago.  He states he drinks beer daily- has not cut back on this recently or tried to quit.  He feels that his skin is crawling and he is very anxious.  He denies recent fever, no vomiting or change in stools.  He has itching chronic skin rash that he states has been present for the past year.  Denies SI/HI.  There are no other associated systemic symptoms, there are no other alleviating or modifying factors.   Past Medical History  Diagnosis Date  . Anxiety   . Cancer ([place])   . PTSD (post-traumatic stress disorder)   . PAD (peripheral artery disease) ([place])   . Lung cancer ([place])   . COPD (chronic obstructive pulmonary disease) ([place])    History reviewed. No pertinent past surgical history. Family History  Problem Relation Age of Onset  . Heart attack Father    Social History  Substance Use Topics  . Smoking status: Current Every Day Smoker -- 1.50 packs/day for 30 years    Types: Cigarettes  . Smokeless tobacco: Never Used  . Alcohol Use: 50.4 oz/week    84 Cans of beer per week    Review of Systems  ROS reviewed and all otherwise negative except for mentioned in HPI    Allergies  Review of patient's allergies indicates no known allergies.  Home Medications   Prior to Admission medications   Medication Sig Start Date End Date Taking? Authorizing Provider  acetaminophen (TYLENOL) 500 MG tablet Take 1,000 mg by mouth every 4 (four) hours as needed. For pain   Yes Historical Provider, MD  Aspirin-Acetaminophen-Caffeine 260-130-16 MG TABS Take 1 packet by mouth as needed (head aches).   Yes Historical Provider,  MD  chlordiazePOXIDE (LIBRIUM) 25 MG capsule '50mg'$  PO TID x 1D, then 25-'50mg'$  PO BID X 1D, then 25-'50mg'$  PO QD X 1D 07/27/15  Yes Nicole Pisciotta, PA-C  doxylamine, Sleep, (UNISOM) 25 MG tablet Take 50 mg by mouth at bedtime as needed for sleep.   Yes Historical Provider, MD  hydrOXYzine (ATARAX/VISTARIL) 25 MG tablet Take 1 tablet (25 mg total) by mouth every 6 (six) hours. 06/30/15  Yes Orpah Greek, MD  omeprazole (PRILOSEC) 40 MG capsule Take 40 mg by mouth daily as needed (acid reflux).  03/07/14  Yes Historical Provider, MD  traZODone (DESYREL) 100 MG tablet Take 100 mg by mouth at bedtime.     Yes Historical Provider, MD  LORazepam (ATIVAN) 1 MG tablet Take 1 tablet (1 mg total) by mouth 3 (three) times daily as needed for anxiety. 11/02/15   Alfonzo Beers, MD   BP 150/86 mmHg  Pulse 97  Temp(Src) 97.6 F (36.4 C) (Oral)  Resp 18  SpO2 99%  Vitals reviewed Physical Exam  Physical Examination: General appearance - alert, well appearing, and in no distress Mental status - alert, oriented to person, place, and time Eyes - no conjunctival injection, no scleral icterus Mouth - mucous membranes moist, pharynx normal without lesions Chest - clear to auscultation, no wheezes, rales or rhonchi, symmetric air entry Heart - normal rate, regular rhythm, normal S1, S2, no murmurs, rubs, clicks  or gallops Abdomen - soft, nontender, nondistended, no masses or organomegaly Neurological - alert, oriented, normal speech Extremities - peripheral pulses normal, no pedal edema, no clubbing or cyanosis Skin - normal coloration and turgor, chronic appearing red rash to lower extremities with some excoriations  ED Course  Procedures (including critical care time) Labs Review Labs Reviewed - No data to display  Imaging Review No results found. I have personally reviewed and evaluated these images and lab results as part of my medical decision-making.   EKG Interpretation None      MDM    Final diagnoses:  Anxiety    Pt presenting with c/o worsening of his chronic anxiety- he has run out of ativan over the past week.  He continues to drink beer daily- is not showing any signs of withdrawal.  Pt given ativan and short rx.  Advised to f/u with PMD for further prescriptions as abruptly stopping this medicaion can cause withdrawal.  Discharged with strict return precautions.  Pt agreeable with plan.    Alfonzo Beers, MD 11/02/15 1013

## 2015-11-02 NOTE — ED Notes (Signed)
Bed: YY51 Expected date:  Expected time:  Means of arrival:  Comments: EMS-anxious

## 2015-11-02 NOTE — Discharge Instructions (Signed)
Return to the ED with any concerns including thoughts or feelings of suicide or homicide, seizure activity, fainting, decreased level of alertness/lethargy, or any other alarming symptoms

## 2015-11-02 NOTE — ED Notes (Signed)
Per EMS-states he has been out of Ativan for 1 week-increased anxiety

## 2015-12-24 ENCOUNTER — Emergency Department (HOSPITAL_COMMUNITY)
Admission: EM | Admit: 2015-12-24 | Discharge: 2015-12-24 | Disposition: A | Discharge status: Home / Self Care | Payer: Medicare Other | Attending: Emergency Medicine | Admitting: Emergency Medicine

## 2015-12-24 ENCOUNTER — Encounter (HOSPITAL_COMMUNITY): Payer: Self-pay | Admitting: Emergency Medicine

## 2015-12-24 DIAGNOSIS — J449 Chronic obstructive pulmonary disease, unspecified: Secondary | ICD-10-CM | POA: Insufficient documentation

## 2015-12-24 DIAGNOSIS — L299 Pruritus, unspecified: Secondary | ICD-10-CM | POA: Diagnosis not present

## 2015-12-24 DIAGNOSIS — F17210 Nicotine dependence, cigarettes, uncomplicated: Secondary | ICD-10-CM | POA: Insufficient documentation

## 2015-12-24 DIAGNOSIS — F10129 Alcohol abuse with intoxication, unspecified: Secondary | ICD-10-CM | POA: Diagnosis not present

## 2015-12-24 NOTE — ED Notes (Signed)
Patient presents from home via EMS for itching x6 months.   Last VS: 136/62, 90hr, 18resp.

## 2015-12-24 NOTE — ED Notes (Signed)
No answer from waiting room.

## 2015-12-24 NOTE — ED Notes (Signed)
Patient requesting detox from ETOH, last drink today (two beers).

## 2015-12-24 NOTE — ED Notes (Signed)
Pt called twice from lobby with no response

## 2016-02-18 ENCOUNTER — Emergency Department (HOSPITAL_COMMUNITY)
Admission: EM | Admit: 2016-02-18 | Discharge: 2016-02-18 | Disposition: A | Discharge status: Home / Self Care | Payer: Medicare Other | Attending: Emergency Medicine | Admitting: Emergency Medicine

## 2016-02-18 ENCOUNTER — Encounter (HOSPITAL_COMMUNITY): Payer: Self-pay | Admitting: Emergency Medicine

## 2016-02-18 DIAGNOSIS — Z85118 Personal history of other malignant neoplasm of bronchus and lung: Secondary | ICD-10-CM | POA: Diagnosis not present

## 2016-02-18 DIAGNOSIS — S50812A Abrasion of left forearm, initial encounter: Secondary | ICD-10-CM | POA: Diagnosis not present

## 2016-02-18 DIAGNOSIS — Z79899 Other long term (current) drug therapy: Secondary | ICD-10-CM | POA: Diagnosis not present

## 2016-02-18 DIAGNOSIS — J449 Chronic obstructive pulmonary disease, unspecified: Secondary | ICD-10-CM | POA: Diagnosis not present

## 2016-02-18 DIAGNOSIS — R001 Bradycardia, unspecified: Secondary | ICD-10-CM | POA: Diagnosis not present

## 2016-02-18 DIAGNOSIS — F419 Anxiety disorder, unspecified: Secondary | ICD-10-CM | POA: Diagnosis not present

## 2016-02-18 DIAGNOSIS — L299 Pruritus, unspecified: Secondary | ICD-10-CM | POA: Insufficient documentation

## 2016-02-18 DIAGNOSIS — Y9389 Activity, other specified: Secondary | ICD-10-CM | POA: Insufficient documentation

## 2016-02-18 DIAGNOSIS — F1020 Alcohol dependence, uncomplicated: Secondary | ICD-10-CM | POA: Diagnosis not present

## 2016-02-18 DIAGNOSIS — Y9289 Other specified places as the place of occurrence of the external cause: Secondary | ICD-10-CM | POA: Diagnosis not present

## 2016-02-18 DIAGNOSIS — X58XXXA Exposure to other specified factors, initial encounter: Secondary | ICD-10-CM | POA: Insufficient documentation

## 2016-02-18 DIAGNOSIS — Y998 Other external cause status: Secondary | ICD-10-CM | POA: Diagnosis not present

## 2016-02-18 DIAGNOSIS — F17210 Nicotine dependence, cigarettes, uncomplicated: Secondary | ICD-10-CM | POA: Insufficient documentation

## 2016-02-18 LAB — CBC WITH DIFFERENTIAL/PLATELET
BASOS ABS: 0.1 10*3/uL (ref 0.0–0.1)
Basophils Relative: 2 %
Eosinophils Absolute: 0.8 10*3/uL — ABNORMAL HIGH (ref 0.0–0.7)
Eosinophils Relative: 15 %
HCT: 42.7 % (ref 39.0–52.0)
HEMOGLOBIN: 14.3 g/dL (ref 13.0–17.0)
LYMPHS ABS: 1.3 10*3/uL (ref 0.7–4.0)
LYMPHS PCT: 26 %
MCH: 30.6 pg (ref 26.0–34.0)
MCHC: 33.5 g/dL (ref 30.0–36.0)
MCV: 91.2 fL (ref 78.0–100.0)
Monocytes Absolute: 0.5 10*3/uL (ref 0.1–1.0)
Monocytes Relative: 9 %
NEUTROS ABS: 2.6 10*3/uL (ref 1.7–7.7)
NEUTROS PCT: 48 %
Platelets: 202 10*3/uL (ref 150–400)
RBC: 4.68 MIL/uL (ref 4.22–5.81)
RDW: 13.9 % (ref 11.5–15.5)
WBC: 5.2 10*3/uL (ref 4.0–10.5)

## 2016-02-18 LAB — COMPREHENSIVE METABOLIC PANEL
ALK PHOS: 48 U/L (ref 38–126)
ALT: 10 U/L — AB (ref 17–63)
AST: 15 U/L (ref 15–41)
Albumin: 3.6 g/dL (ref 3.5–5.0)
Anion gap: 9 (ref 5–15)
BUN: 10 mg/dL (ref 6–20)
CALCIUM: 8.7 mg/dL — AB (ref 8.9–10.3)
CO2: 26 mmol/L (ref 22–32)
CREATININE: 0.59 mg/dL — AB (ref 0.61–1.24)
Chloride: 106 mmol/L (ref 101–111)
Glucose, Bld: 73 mg/dL (ref 65–99)
Potassium: 3.3 mmol/L — ABNORMAL LOW (ref 3.5–5.1)
Sodium: 141 mmol/L (ref 135–145)
Total Bilirubin: 0.2 mg/dL — ABNORMAL LOW (ref 0.3–1.2)
Total Protein: 6.5 g/dL (ref 6.5–8.1)

## 2016-02-18 LAB — RAPID URINE DRUG SCREEN, HOSP PERFORMED
Amphetamines: NOT DETECTED
BARBITURATES: NOT DETECTED
Benzodiazepines: NOT DETECTED
Cocaine: NOT DETECTED
Opiates: NOT DETECTED
Tetrahydrocannabinol: NOT DETECTED

## 2016-02-18 LAB — URINALYSIS, ROUTINE W REFLEX MICROSCOPIC
BILIRUBIN URINE: NEGATIVE
Glucose, UA: NEGATIVE mg/dL
Hgb urine dipstick: NEGATIVE
Ketones, ur: NEGATIVE mg/dL
Leukocytes, UA: NEGATIVE
NITRITE: NEGATIVE
PH: 5 (ref 5.0–8.0)
PROTEIN: NEGATIVE mg/dL
Specific Gravity, Urine: 1.019 (ref 1.005–1.030)

## 2016-02-18 LAB — ETHANOL

## 2016-02-18 MED ORDER — POTASSIUM CHLORIDE CRYS ER 20 MEQ PO TBCR
20.0000 meq | EXTENDED_RELEASE_TABLET | Freq: Once | ORAL | Status: AC
  Administered 2016-02-18: 20 meq via ORAL
  Filled 2016-02-18: qty 1

## 2016-02-18 MED ORDER — VITAMIN B-1 100 MG PO TABS
100.0000 mg | ORAL_TABLET | Freq: Once | ORAL | Status: AC
  Administered 2016-02-18: 100 mg via ORAL
  Filled 2016-02-18: qty 1

## 2016-02-18 MED ORDER — DIPHENHYDRAMINE HCL 50 MG/ML IJ SOLN
25.0000 mg | Freq: Once | INTRAMUSCULAR | Status: AC
  Administered 2016-02-18: 25 mg via INTRAVENOUS
  Filled 2016-02-18: qty 1

## 2016-02-18 MED ORDER — SODIUM CHLORIDE 0.9 % IV BOLUS (SEPSIS)
1000.0000 mL | Freq: Once | INTRAVENOUS | Status: AC
  Administered 2016-02-18: 1000 mL via INTRAVENOUS

## 2016-02-18 MED ORDER — CHLORDIAZEPOXIDE HCL 25 MG PO CAPS: ORAL_CAPSULE | ORAL | Status: DC

## 2016-02-18 MED ORDER — FOLIC ACID 1 MG PO TABS
1.0000 mg | ORAL_TABLET | Freq: Every day | ORAL | Status: DC
  Administered 2016-02-18: 1 mg via ORAL
  Filled 2016-02-18: qty 1

## 2016-02-18 MED ORDER — DIAZEPAM 5 MG/ML IJ SOLN
5.0000 mg | Freq: Once | INTRAMUSCULAR | Status: AC
  Administered 2016-02-18: 5 mg via INTRAVENOUS
  Filled 2016-02-18: qty 2

## 2016-02-18 NOTE — ED Notes (Signed)
Nurse starting IV will collect labs

## 2016-02-18 NOTE — ED Notes (Signed)
PT DISCHARGED. INSTRUCTIONS AND PRESCRIPTION GIVEN. AAOX3. PT IN NO APPARENT DISTRESS OR PAIN. THE OPPORTUNITY TO ASK QUESTIONS WAS PROVIDED. 

## 2016-02-18 NOTE — ED Notes (Signed)
Per EMS-pt called with c/o itching over whole body x 3 months. Currently treating with rx for dermotologist, w/o relief. Pt is alert, oriented and appropriate and ambulatory. Lives with brother

## 2016-02-18 NOTE — ED Provider Notes (Signed)
CSN: [id number]     Arrival date & time 02/18/16  1134 History   First MD Initiated Contact with Patient 02/18/16 1701     Chief Complaint  Patient presents with  . Pruritis    whole body itching x 3 months     (Consider location/radiation/quality/duration/timing/severity/associated sxs/prior Treatment) HPI   Patient is a 70 year old male with a history of alcohol abuse, PTSD, PAD, lung cancer, COPD, anxiety who presents the ED with pruritus for months.  He has seen a dermatologist with no relief, he has not used anything recently for the pruritus. He states it is all over his body. He also is here for alcohol detox. He believes his pruritus may be due to his alcohol consumption. He states he "probably should" detox as his reasoning for why he needs alcohol detox. He denies any suicidal or homicidal thoughts. He denies tremors, hallucinations, nausea, vomiting, diarrhea, chest pain, shortness of breath, visual or hearing changes, dysuria, hematuria.  Past Medical History  Diagnosis Date  . Anxiety   . Cancer ([place])   . PTSD (post-traumatic stress disorder)   . PAD (peripheral artery disease) ([place])   . Lung cancer ([place])   . COPD (chronic obstructive pulmonary disease) ([place])    History reviewed. No pertinent past surgical history. Family History  Problem Relation Age of Onset  . Heart attack Father    Social History  Substance Use Topics  . Smoking status: Current Every Day Smoker -- 1.50 packs/day for 30 years    Types: Cigarettes  . Smokeless tobacco: Never Used  . Alcohol Use: 50.4 oz/week    84 Cans of beer per week    Review of Systems  Constitutional: Negative for fever and chills.  HENT: Negative for hearing loss and tinnitus.   Eyes: Negative for visual disturbance.  Respiratory: Negative for shortness of breath.   Cardiovascular: Negative for chest pain and leg swelling.  Gastrointestinal: Negative for nausea, vomiting, abdominal pain, diarrhea and constipation.   Genitourinary: Negative for dysuria and hematuria.  Musculoskeletal: Negative for back pain and neck pain.  Skin: Positive for rash.  Neurological: Negative for dizziness and syncope.  Psychiatric/Behavioral: Negative for confusion.      Allergies  Review of patient's allergies indicates no known allergies.  Home Medications   Prior to Admission medications   Medication Sig Start Date End Date Taking? Authorizing Provider  acetaminophen (TYLENOL) 500 MG tablet Take 1,000 mg by mouth daily. For pain   Yes Historical Provider, MD  Aspirin-Acetaminophen-Caffeine 260-130-16 MG TABS Take 1 packet by mouth 4 (four) times daily.    Yes Historical Provider, MD  LORazepam (ATIVAN) 1 MG tablet Take 1 tablet (1 mg total) by mouth 3 (three) times daily as needed for anxiety. Patient taking differently: Take 1 mg by mouth daily.  11/02/15  Yes Alfonzo Beers, MD  traZODone (DESYREL) 100 MG tablet Take 100 mg by mouth at bedtime.     Yes Historical Provider, MD  chlordiazePOXIDE (LIBRIUM) 25 MG capsule '50mg'$  PO TID x 1D, then 25-'50mg'$  PO BID X 1D, then 25-'50mg'$  PO QD X 1D 02/18/16   Kalman Drape, PA  hydrOXYzine (ATARAX/VISTARIL) 25 MG tablet Take 1 tablet (25 mg total) by mouth every 6 (six) hours. Patient not taking: Reported on 02/18/2016 06/30/15   Orpah Greek, MD   BP 137/73 mmHg  Pulse 65  Temp(Src) 97.9 F (36.6 C) (Oral)  Resp 18  SpO2 99% Physical Exam  Constitutional: He appears well-developed and well-nourished. No  distress.  HENT:  Head: Normocephalic and atraumatic.  Mouth/Throat: Oropharynx is clear and moist and mucous membranes are normal.  Eyes: Conjunctivae are normal.  Cardiovascular: Normal heart sounds.  Bradycardia present.   Pulmonary/Chest: Effort normal.  Decreased breath sounds throughout all lung fields  Abdominal: There is no tenderness.  Musculoskeletal: Normal range of motion. He exhibits no edema.  Neurological: He is alert. Coordination normal.  Skin:   Normal skin color and turgor, chronic-appearing rash with excoriation to bilateral upper and lower extremities, abrasions noted to the left forearm  Psychiatric: He has a normal mood and affect. His speech is normal and behavior is normal. Thought content normal.  Nursing note and vitals reviewed.   ED Course  Procedures (including critical care time) Labs Review Labs Reviewed  CBC WITH DIFFERENTIAL/PLATELET - Abnormal; Notable for the following:    Eosinophils Absolute 0.8 (*)    All other components within normal limits  COMPREHENSIVE METABOLIC PANEL - Abnormal; Notable for the following:    Potassium 3.3 (*)    Creatinine, Ser 0.59 (*)    Calcium 8.7 (*)    ALT 10 (*)    Total Bilirubin 0.2 (*)    All other components within normal limits  URINALYSIS, ROUTINE W REFLEX MICROSCOPIC (NOT AT [facility])  URINE RAPID DRUG SCREEN, HOSP PERFORMED  ETHANOL    Imaging Review No results found. I have personally reviewed and evaluated these images and lab results as part of my medical decision-making.   EKG Interpretation None      MDM   Final diagnoses:  Itching  Uncomplicated alcohol dependence ([place])    Patient with pruritus, and requesting alcohol detox without signs of DT. Patient without shaking, confusion, fever or hallucinations. Patient is stable, VSS, labs unremarkable, drug screen and ethanol negative. PO potassium given. Patient given Librium, thiamine, Valium while in the ED. Patient given Benadryl for rash states his itching has improved.   Instructed patient to follow up with his primary care provider. Discussed strict return precautions. Patient given prescription for Librium at discharge and resource information for help with his alcohol addiction. I discussed all of the results with the patient and he expressed understanding to the verbal discharge instructions.     Kalman Drape, PA 02/18/16 2154  Virgel Manifold, MD 02/21/16 [phone]

## 2016-02-18 NOTE — ED Notes (Signed)
Patient stuck 2 more times for blood work, but both attempts unsuccessful.

## 2016-02-18 NOTE — Discharge Instructions (Signed)
[facility] Guide Inpatient Behavioral Health/Residential  Substance Abuse Treatment Adults The [employer] 211 is a great source of information about community services available.  Access by dialing 2-1-1 from anywhere in [place], or by website -  Cust[employer]ianSupp[employer].fi.   (Updated 10/2015)  Crisis Assistance 24 hours a day   [facility]  24-hour crisis assistance: [place], [place]   [facility]  24-hour crisis [place], [place]   24-hour crisis assistance: [place], [place] Access to Care Line  24-hour crisis assistance; [phone] All   [facility]  24-hour crisis response line: [phone] All   Other Local Resources (Updated 10/2015)  Inpatient Behavioral Health/Residential Substance Abuse Treatment Programs   Services      Address and Phone Number  [place] ([place])   14-day residential rehabilitation  [phone] [address] [place], [place] ([place])    Detox - private pay only  14-day residential rehabilitation -  Medicaid, insurance, private pay only [phone], or [place], [place], [place] [address]   [place] only  Multiple facilities [phone] admissions   [facility] ([place])   90-day program  Must be homeless to participate  [phone], or [place], [place] only [phone], or  [place], [place] [address]  [place]     Must make an appointment  Transportation is offered from [place] on [employer] [address] [employer] [address].  Accepts private pay, [facility] [phone]  [place] [address] [place], [place] [address]   [employer]  Females only  Associated with the [place] [phone] [place], [place] [address]  [place]   Private insurance only [phone], or [phone] [address] [place], [place]  [place]    Detox  Residential rehabilitation  Private insurance only  Multiple locations [phone] admissions  [place] of [place] pay  Private insurance [phone] [address] [place] [address]  [facility]    Males only  Fee required at time of admission [place], [place] [address]  [place] pay only  [phone] [address] [place], [place]  [facility] ([facility]idential Treatment Services)    Detox - private pay, Medicaid  Residential rehabilitation for males  - Medicare, Medicaid, insurance, private pay [phone] [place], [place] interviews Monday - Saturday from 8 am - 4 pm  Individuals with legal charges are not eligible [phone] [address] [place],  [address]  [facility]   Must be willing to work  Must attend Alcoholics Anonymous meetings [phone] [address] [place], [place]    Faith-based program  Private pay only [phone] [place], [place]

## 2016-02-18 NOTE — ED Notes (Signed)
Rn attempted IV x 2, both unsuccessful.

## 2016-04-04 ENCOUNTER — Emergency Department (HOSPITAL_COMMUNITY)
Admission: EM | Admit: 2016-04-04 | Discharge: 2016-04-04 | Disposition: A | Discharge status: Home / Self Care | Payer: Medicare Other | Attending: Emergency Medicine | Admitting: Emergency Medicine

## 2016-04-04 ENCOUNTER — Encounter (HOSPITAL_COMMUNITY): Payer: Self-pay | Admitting: *Deleted

## 2016-04-04 DIAGNOSIS — F17210 Nicotine dependence, cigarettes, uncomplicated: Secondary | ICD-10-CM | POA: Insufficient documentation

## 2016-04-04 DIAGNOSIS — F1010 Alcohol abuse, uncomplicated: Secondary | ICD-10-CM | POA: Insufficient documentation

## 2016-04-04 DIAGNOSIS — Z79899 Other long term (current) drug therapy: Secondary | ICD-10-CM | POA: Diagnosis not present

## 2016-04-04 DIAGNOSIS — F419 Anxiety disorder, unspecified: Secondary | ICD-10-CM | POA: Diagnosis present

## 2016-04-04 LAB — COMPREHENSIVE METABOLIC PANEL
ALK PHOS: 62 U/L (ref 38–126)
ALT: 12 U/L — ABNORMAL LOW (ref 17–63)
ANION GAP: 7 (ref 5–15)
AST: 19 U/L (ref 15–41)
Albumin: 4.1 g/dL (ref 3.5–5.0)
BILIRUBIN TOTAL: 0.2 mg/dL — AB (ref 0.3–1.2)
BUN: 11 mg/dL (ref 6–20)
CALCIUM: 9.1 mg/dL (ref 8.9–10.3)
CO2: 28 mmol/L (ref 22–32)
Chloride: 102 mmol/L (ref 101–111)
Creatinine, Ser: 0.61 mg/dL (ref 0.61–1.24)
GFR calc Af Amer: >60 mL/min (ref >60)
GLUCOSE: 86 mg/dL (ref 65–99)
Potassium: 3.7 mmol/L (ref 3.5–5.1)
Sodium: 137 mmol/L (ref 135–145)
TOTAL PROTEIN: 7.6 g/dL (ref 6.5–8.1)

## 2016-04-04 LAB — CBC WITH DIFFERENTIAL/PLATELET
Basophils Absolute: 0.1 10*3/uL (ref 0.0–0.1)
Basophils Relative: 1 %
Eosinophils Absolute: 0.7 10*3/uL (ref 0.0–0.7)
Eosinophils Relative: 12 %
HEMATOCRIT: 41.8 % (ref 39.0–52.0)
Hemoglobin: 14.4 g/dL (ref 13.0–17.0)
LYMPHS PCT: 30 %
Lymphs Abs: 1.7 10*3/uL (ref 0.7–4.0)
MCH: 31.3 pg (ref 26.0–34.0)
MCHC: 34.4 g/dL (ref 30.0–36.0)
MCV: 90.9 fL (ref 78.0–100.0)
MONO ABS: 0.4 10*3/uL (ref 0.1–1.0)
Monocytes Relative: 7 %
NEUTROS ABS: 2.9 10*3/uL (ref 1.7–7.7)
Neutrophils Relative %: 50 %
Platelets: 213 10*3/uL (ref 150–400)
RBC: 4.6 MIL/uL (ref 4.22–5.81)
RDW: 13.8 % (ref 11.5–15.5)
WBC: 5.7 10*3/uL (ref 4.0–10.5)

## 2016-04-04 LAB — RAPID URINE DRUG SCREEN, HOSP PERFORMED
Amphetamines: NOT DETECTED
BARBITURATES: NOT DETECTED
Benzodiazepines: POSITIVE — AB
COCAINE: NOT DETECTED
Opiates: NOT DETECTED
Tetrahydrocannabinol: NOT DETECTED

## 2016-04-04 LAB — ETHANOL

## 2016-04-04 MED ORDER — GI COCKTAIL ~~LOC~~
30.0000 mL | Freq: Once | ORAL | Status: AC
  Administered 2016-04-04: 30 mL via ORAL
  Filled 2016-04-04: qty 30

## 2016-04-04 NOTE — ED Notes (Signed)
Discharge instructions and follow up care reviewed with patient. Patient verbalized understanding. 

## 2016-04-04 NOTE — ED Provider Notes (Signed)
CSN: [id number]     Arrival date & time 04/04/16  4098 History   First MD Initiated Contact with Patient 04/04/16 [phone]     Chief Complaint  Patient presents with  . Anxiety     (Consider location/radiation/quality/duration/timing/severity/associated sxs/prior Treatment) HPI David Lucero is a 70 y.o. male history of PTSD, anxiety, alcohol abuse, presents to emergency department complaining of alcohol dependency and requesting detox. Patient states he drinks every day. Patient states he drank 12 beers yesterday. Patient also has sensation of skin itching. He states "I have all these hairs that are itching me." He denies suicidal homicidal ideations. He denies nausea vomiting. He reports mild epigastric pain. Denies any other complaints. Pt reports hx of detox in the past.   Past Medical History  Diagnosis Date  . Anxiety   . PTSD (post-traumatic stress disorder)   . PAD (peripheral artery disease) ([place])   . COPD (chronic obstructive pulmonary disease) ([place])   . Cancer ([place])   . Lung cancer [facility])    History reviewed. No pertinent past surgical history. Family History  Problem Relation Age of Onset  . Heart attack Father    Social History  Substance Use Topics  . Smoking status: Current Every Day Smoker -- 1.50 packs/day for 30 years    Types: Cigarettes  . Smokeless tobacco: Never Used  . Alcohol Use: 50.4 oz/week    84 Cans of beer per week    Review of Systems  Constitutional: Negative for fever and chills.  Respiratory: Negative for cough, chest tightness and shortness of breath.   Cardiovascular: Negative for chest pain, palpitations and leg swelling.  Gastrointestinal: Negative for nausea, vomiting, abdominal pain, diarrhea and abdominal distention.  Genitourinary: Negative for dysuria, urgency, frequency and hematuria.  Musculoskeletal: Negative for myalgias, arthralgias, neck pain and neck stiffness.  Allergic/Immunologic: Negative for immunocompromised state.   Neurological: Negative for dizziness, weakness, light-headedness, numbness and headaches.  Psychiatric/Behavioral: Negative for suicidal ideas and self-injury. The patient is nervous/anxious.   All other systems reviewed and are negative.     Allergies  Review of patient's allergies indicates no known allergies.  Home Medications   Prior to Admission medications   Medication Sig Start Date End Date Taking? Authorizing Provider  acetaminophen (TYLENOL) 500 MG tablet Take 1,000 mg by mouth daily. For pain   Yes Historical Provider, MD  Aspirin-Acetaminophen-Caffeine 260-130-16 MG TABS Take 1 packet by mouth 4 (four) times daily.    Yes Historical Provider, MD  hydrOXYzine (VISTARIL) 25 MG capsule Take 25 mg by mouth 3 (three) times daily as needed. itching 03/27/16 04/26/16 Yes Historical Provider, MD  LORazepam (ATIVAN) 0.5 MG tablet Take 0.5 mg by mouth daily as needed. Anxiety *use sparingly* 03/19/16  Yes Historical Provider, MD  traZODone (DESYREL) 100 MG tablet Take 100 mg by mouth at bedtime as needed for sleep.    Yes Historical Provider, MD  chlordiazePOXIDE (LIBRIUM) 25 MG capsule '50mg'$  PO TID x 1D, then 25-'50mg'$  PO BID X 1D, then 25-'50mg'$  PO QD X 1D Patient not taking: Reported on 04/04/2016 02/18/16   Kalman Drape, PA  hydrOXYzine (ATARAX/VISTARIL) 25 MG tablet Take 1 tablet (25 mg total) by mouth every 6 (six) hours. Patient not taking: Reported on 02/18/2016 06/30/15   Orpah Greek, MD  LORazepam (ATIVAN) 1 MG tablet Take 1 tablet (1 mg total) by mouth 3 (three) times daily as needed for anxiety. Patient not taking: Reported on 04/04/2016 11/02/15   Alfonzo Beers, MD  sertraline (  ZOLOFT) 50 MG tablet Take 50 mg by mouth daily. Reported on 04/04/2016 03/27/16 03/27/17  Historical Provider, MD   BP 156/71 mmHg  Pulse 65  Resp 17  SpO2 100% Physical Exam  Constitutional: He is oriented to person, place, and time. He appears well-developed and well-nourished. No distress.  HENT:   Head: Normocephalic and atraumatic.  Eyes: Conjunctivae are normal.  Neck: Neck supple.  Cardiovascular: Normal rate, regular rhythm and normal heart sounds.   Pulmonary/Chest: Effort normal. No respiratory distress. He has no wheezes. He has no rales.  Abdominal: Soft. Bowel sounds are normal. He exhibits no distension. There is no tenderness. There is no rebound.  Musculoskeletal: He exhibits no edema.  Neurological: He is alert and oriented to person, place, and time.  Skin: Skin is warm and dry.  Psychiatric: He has a normal mood and affect. His behavior is normal.  Nursing note and vitals reviewed.   ED Course  Procedures (including critical care time) Labs Review Labs Reviewed  COMPREHENSIVE METABOLIC PANEL - Abnormal; Notable for the following:    ALT 12 (*)    Total Bilirubin 0.2 (*)    All other components within normal limits  URINE RAPID DRUG SCREEN, HOSP PERFORMED - Abnormal; Notable for the following:    Benzodiazepines POSITIVE (*)    All other components within normal limits  CBC WITH DIFFERENTIAL/PLATELET  ETHANOL    Imaging Review No results found. I have personally reviewed and evaluated these images and lab results as part of my medical decision-making.   EKG Interpretation None      MDM   Final diagnoses:  Alcohol abuse   Patient in emergency department requesting alcohol detox. He is in no acute distress. He does not appear to be intoxicated this time. His vital signs are normal other than mild hypertension. Will check medical clearance labs. If negative he'll be discharged home with resources. Not suicidal or homicidal at this time.  10:58 AM Labs unremarkable. Alcohol level is negative. His drug screen is positive for benzodiazepines. Will discharge home with resources for outpatient detox. He is medically cleared. Follow-up as needed.  Filed Vitals:   04/04/16 0722 04/04/16 0944 04/04/16 1050  BP: 171/89 156/71 154/85  Pulse: 67 65 66   Resp: '16 17 18  '$ SpO2: 100% 100% 100%     Jeannett Senior, PA-C 04/04/16 [place], MD 04/11/16 [phone]

## 2016-04-04 NOTE — ED Notes (Signed)
Patient states his GERD is bothering him and requested something for it.  Order obtained for GI cocktail.

## 2016-04-04 NOTE — ED Notes (Signed)
Patient from home with c/o anxiety and daily alcohol use.  Patient states he drinks alcohol (usually beer) on a daily basis.  Patient also states he feels like he is "tingling all over."  Patient denies SI/HI.  Patient denies N/V, but he has emesis on his shirt.

## 2016-04-04 NOTE — Discharge Instructions (Signed)
Please follow-up with resources provided for alcohol detox. You can take Benadryl for itching.   Alcohol Use Disorder Alcohol use disorder is a mental disorder. It is not a one-time incident of heavy drinking. Alcohol use disorder is the excessive and uncontrollable use of alcohol over time that leads to problems with functioning in one or more areas of daily living. People with this disorder risk harming themselves and others when they drink to excess. Alcohol use disorder also can cause other mental disorders, such as mood and anxiety disorders, and serious physical problems. People with alcohol use disorder often misuse other drugs.  Alcohol use disorder is common and widespread. Some people with this disorder drink alcohol to cope with or escape from negative life events. Others drink to relieve chronic pain or symptoms of mental illness. People with a family history of alcohol use disorder are at higher risk of losing control and using alcohol to excess.  Drinking too much alcohol can cause injury, accidents, and health problems. One drink can be too much when you are:  Working.  Pregnant or breastfeeding.  Taking medicines. Ask your doctor.  Driving or planning to drive. SYMPTOMS  Signs and symptoms of alcohol use disorder may include the following:   Consumption ofalcohol inlarger amounts or over a longer period of time than intended.  Multiple unsuccessful attempts to cutdown or control alcohol use.   A great deal of time spent obtaining alcohol, using alcohol, or recovering from the effects of alcohol (hangover).  A strong desire or urge to use alcohol (cravings).   Continued use of alcohol despite problems at work, school, or home because of alcohol use.   Continued use of alcohol despite problems in relationships because of alcohol use.  Continued use of alcohol in situations when it is physically hazardous, such as driving a car.  Continued use of alcohol despite  awareness of a physical or psychological problem that is likely related to alcohol use. Physical problems related to alcohol use can involve the brain, heart, liver, stomach, and intestines. Psychological problems related to alcohol use include intoxication, depression, anxiety, psychosis, delirium, and dementia.   The need for increased amounts of alcohol to achieve the same desired effect, or a decreased effect from the consumption of the same amount of alcohol (tolerance).  Withdrawal symptoms upon reducing or stopping alcohol use, or alcohol use to reduce or avoid withdrawal symptoms. Withdrawal symptoms include:  Racing heart.  Hand tremor.  Difficulty sleeping.  Nausea.  Vomiting.  Hallucinations.  Restlessness.  Seizures. DIAGNOSIS Alcohol use disorder is diagnosed through an assessment by your health care provider. Your health care provider may start by asking three or four questions to screen for excessive or problematic alcohol use. To confirm a diagnosis of alcohol use disorder, at least two symptoms must be present within a 76-monthperiod. The severity of alcohol use disorder depends on the number of symptoms:  Mild--two or three.  Moderate--four or five.  Severe--six or more. Your health care provider may perform a physical exam or use results from lab tests to see if you have physical problems resulting from alcohol use. Your health care provider may refer you to a mental health professional for evaluation. TREATMENT  Some people with alcohol use disorder are able to reduce their alcohol use to low-risk levels. Some people with alcohol use disorder need to quit drinking alcohol. When necessary, mental health professionals with specialized training in substance use treatment can help. Your health care provider can help  you decide how severe your alcohol use disorder is and what type of treatment you need. The following forms of treatment are available:    Detoxification. Detoxification involves the use of prescription medicines to prevent alcohol withdrawal symptoms in the first week after quitting. This is important for people with a history of symptoms of withdrawal and for heavy drinkers who are likely to have withdrawal symptoms. Alcohol withdrawal can be dangerous and, in severe cases, cause death. Detoxification is usually provided in a hospital or in-patient substance use treatment facility.  Counseling or talk therapy. Talk therapy is provided by substance use treatment counselors. It addresses the reasons people use alcohol and ways to keep them from drinking again. The goals of talk therapy are to help people with alcohol use disorder find healthy activities and ways to cope with life stress, to identify and avoid triggers for alcohol use, and to handle cravings, which can cause relapse.  Medicines.Different medicines can help treat alcohol use disorder through the following actions:  Decrease alcohol cravings.  Decrease the positive reward response felt from alcohol use.  Produce an uncomfortable physical reaction when alcohol is used (aversion therapy).  Support groups. Support groups are run by people who have quit drinking. They provide emotional support, advice, and guidance. These forms of treatment are often combined. Some people with alcohol use disorder benefit from intensive combination treatment provided by specialized substance use treatment centers. Both inpatient and outpatient treatment programs are available.   This information is not intended to replace advice given to you by your health care provider. Make sure you discuss any questions you have with your health care provider.   Document Released: 11/06/2004 Document Revised: 10/20/2014 Document Reviewed: 01/06/2013 Elsevier Interactive Patient Education [employer].

## 2016-04-04 NOTE — ED Notes (Addendum)
Per EMS - patient comes from home where he lives with his brother.  Patient has a hx of PTSD and anxiety.  Around 1800 last night patient began having an exacerbation of anxiety.  He takes hydroxyzine and lorazepam for anxiety, but they did not help.  Patient also drank 12 beers, but that didn't help either.  Patient denies vomiting, but EMS noted emesis on scene (no hematemesis).  Patient expressed an interest in detox.  Patient not expressing SI/HI.  Patient's vitals 128/88, HR 64, RR 16.

## 2016-04-09 ENCOUNTER — Encounter (HOSPITAL_COMMUNITY): Payer: Self-pay | Admitting: Emergency Medicine

## 2016-04-09 ENCOUNTER — Emergency Department (HOSPITAL_COMMUNITY)
Admission: EM | Admit: 2016-04-09 | Discharge: 2016-04-09 | Disposition: A | Discharge status: Home / Self Care | Payer: Medicare Other | Attending: Emergency Medicine | Admitting: Emergency Medicine

## 2016-04-09 DIAGNOSIS — J449 Chronic obstructive pulmonary disease, unspecified: Secondary | ICD-10-CM | POA: Diagnosis not present

## 2016-04-09 DIAGNOSIS — Z85118 Personal history of other malignant neoplasm of bronchus and lung: Secondary | ICD-10-CM | POA: Diagnosis not present

## 2016-04-09 DIAGNOSIS — L299 Pruritus, unspecified: Secondary | ICD-10-CM | POA: Diagnosis not present

## 2016-04-09 DIAGNOSIS — Z7982 Long term (current) use of aspirin: Secondary | ICD-10-CM | POA: Insufficient documentation

## 2016-04-09 DIAGNOSIS — Z79899 Other long term (current) drug therapy: Secondary | ICD-10-CM | POA: Diagnosis not present

## 2016-04-09 DIAGNOSIS — F17210 Nicotine dependence, cigarettes, uncomplicated: Secondary | ICD-10-CM | POA: Diagnosis not present

## 2016-04-09 DIAGNOSIS — F1010 Alcohol abuse, uncomplicated: Secondary | ICD-10-CM | POA: Diagnosis not present

## 2016-04-09 LAB — RAPID URINE DRUG SCREEN, HOSP PERFORMED
Amphetamines: NOT DETECTED
BENZODIAZEPINES: NOT DETECTED
Barbiturates: POSITIVE — AB
Cocaine: NOT DETECTED
OPIATES: NOT DETECTED

## 2016-04-09 LAB — URINALYSIS, ROUTINE W REFLEX MICROSCOPIC
Glucose, UA: NEGATIVE mg/dL
Hgb urine dipstick: NEGATIVE
KETONES UR: NEGATIVE mg/dL
LEUKOCYTES UA: NEGATIVE
NITRITE: NEGATIVE
Protein, ur: NEGATIVE mg/dL
SPECIFIC GRAVITY, URINE: 1.041 — AB (ref 1.005–1.030)
pH: 5 (ref 5.0–8.0)

## 2016-04-09 MED ORDER — CHLORDIAZEPOXIDE HCL 25 MG PO CAPS: ORAL_CAPSULE | ORAL | Status: DC

## 2016-04-09 MED ORDER — DIAZEPAM 5 MG/ML IJ SOLN: 5.0000 mg | Freq: Once | INTRAMUSCULAR | Status: DC

## 2016-04-09 MED ORDER — DIPHENHYDRAMINE HCL 25 MG PO CAPS
25.0000 mg | ORAL_CAPSULE | Freq: Once | ORAL | Status: AC
  Administered 2016-04-09: 25 mg via ORAL
  Filled 2016-04-09: qty 1

## 2016-04-09 MED ORDER — DIAZEPAM 5 MG/ML IJ SOLN
5.0000 mg | Freq: Once | INTRAMUSCULAR | Status: AC
  Administered 2016-04-09: 5 mg via INTRAMUSCULAR
  Filled 2016-04-09: qty 2

## 2016-04-09 NOTE — ED Notes (Signed)
Per EMS, patient states he is "itching all over" Patient wants to detox. Patient normally has 12 beers a day. Today, he has only had 3 beers. Alert, oriented, and ambulatory. Denies SI/HI.

## 2016-04-09 NOTE — ED Provider Notes (Signed)
CSN: [id number]     Arrival date & time 04/09/16  1619 History   First MD Initiated Contact with Patient 04/09/16 1646     Chief Complaint  Patient presents with  . Detox    (Consider location/radiation/quality/duration/timing/severity/associated sxs/prior Treatment) The history is provided by the patient and medical records. No language interpreter was used.     David Lucero is a 70 y.o. male  with a PMH of PTSD, anxiety, ETOH abuse who presents to the Emergency Department complaining of "itching all over" stating that "all of my hairs keep itching" since this morning and requesting detox. No medications taken PTA. Patient states that he drinks approx. 12 beers daily and would like to quit. Today has only had 3 beers which was approx. 2 hours ago. Denies nausea, vomiting, abdominal pain. Patient does have dried, crusted emesis on his shirt, however he states that was not from today. Denies SI/HI, auditory/visual hallucinations. Denies drug use. Denies any other associated symptoms.   Past Medical History  Diagnosis Date  . Anxiety   . PTSD (post-traumatic stress disorder)   . PAD (peripheral artery disease) ([place])   . COPD (chronic obstructive pulmonary disease) ([place])   . Cancer ([place])   . Lung cancer [facility])    History reviewed. No pertinent past surgical history. Family History  Problem Relation Age of Onset  . Heart attack Father    Social History  Substance Use Topics  . Smoking status: Current Every Day Smoker -- 1.50 packs/day for 30 years    Types: Cigarettes  . Smokeless tobacco: Never Used  . Alcohol Use: 50.4 oz/week    84 Cans of beer per week     Comment: 12 or more beers/day    Review of Systems  Constitutional: Negative for fever and chills.  HENT: Negative for congestion.   Eyes: Negative for visual disturbance.  Respiratory: Negative for cough and shortness of breath.   Cardiovascular: Negative.   Gastrointestinal: Negative for nausea, vomiting and  abdominal pain.  Musculoskeletal: Negative for back pain and neck pain.  Skin: Negative for rash.  Neurological: Negative for headaches.  Psychiatric/Behavioral: Negative for suicidal ideas and sleep disturbance. The patient is not nervous/anxious.       Allergies  Review of patient's allergies indicates no known allergies.  Home Medications   Prior to Admission medications   Medication Sig Start Date End Date Taking? Authorizing Provider  acetaminophen (TYLENOL) 500 MG tablet Take 1,000 mg by mouth daily. For pain    Historical Provider, MD  Aspirin-Acetaminophen-Caffeine 260-130-16 MG TABS Take 1 packet by mouth 4 (four) times daily.     Historical Provider, MD  chlordiazePOXIDE (LIBRIUM) 25 MG capsule '50mg'$  PO TID x 1D, then 25-'50mg'$  PO BID X 1D, then 25-'50mg'$  PO QD X 1D 04/09/16   [facility] Xuan Mateus, PA-C  hydrOXYzine (ATARAX/VISTARIL) 25 MG tablet Take 1 tablet (25 mg total) by mouth every 6 (six) hours. Patient not taking: Reported on 02/18/2016 06/30/15   Orpah Greek, MD  hydrOXYzine (VISTARIL) 25 MG capsule Take 25 mg by mouth 3 (three) times daily as needed. itching 03/27/16 04/26/16  Historical Provider, MD  LORazepam (ATIVAN) 0.5 MG tablet Take 0.5 mg by mouth daily as needed. Anxiety *use sparingly* 03/19/16   Historical Provider, MD  LORazepam (ATIVAN) 1 MG tablet Take 1 tablet (1 mg total) by mouth 3 (three) times daily as needed for anxiety. Patient not taking: Reported on 04/04/2016 11/02/15   Alfonzo Beers, MD  sertraline (ZOLOFT)  50 MG tablet Take 50 mg by mouth daily. Reported on 04/04/2016 03/27/16 03/27/17  Historical Provider, MD  traZODone (DESYREL) 100 MG tablet Take 100 mg by mouth at bedtime as needed for sleep.     Historical Provider, MD   BP 148/70 mmHg  Pulse 57  Temp(Src) 97.5 F (36.4 C) (Oral)  Resp 18  Ht '5\' 5"'$  (1.651 m)  Wt 68.04 kg  BMI 24.96 kg/m2  SpO2 99% Physical Exam  Constitutional: He is oriented to person, place, and time. He appears  well-developed and well-nourished. No distress.  HENT:  Head: Normocephalic and atraumatic.  Cardiovascular: Normal rate, regular rhythm and normal heart sounds.   Pulmonary/Chest: Effort normal and breath sounds normal. No respiratory distress. He has no wheezes. He has no rales.  Abdominal: Soft. He exhibits no distension. There is no tenderness.  Musculoskeletal: He exhibits no edema.  Neurological: He is alert and oriented to person, place, and time.  Skin: Skin is warm and dry. No rash noted. No erythema.  Nursing note and vitals reviewed.   ED Course  Procedures (including critical care time) Labs Review Labs Reviewed  URINALYSIS, ROUTINE W REFLEX MICROSCOPIC (NOT AT [facility]) - Abnormal; Notable for the following:    Specific Gravity, Urine 1.041 (*)    Bilirubin Urine SMALL (*)    All other components within normal limits  URINE RAPID DRUG SCREEN, HOSP PERFORMED - Abnormal; Notable for the following:    Tetrahydrocannabinol RESULTS UNAVAILABLE DUE TO INTERFERING SUBSTANCE (*)    Barbiturates POSITIVE (*)    All other components within normal limits    Imaging Review No results found. I have personally reviewed and evaluated these images and lab results as part of my medical decision-making.   EKG Interpretation None      MDM   Final diagnoses:  Itching  Alcohol abuse   David Lucero presents to ED requesting ETOH detox and complaining of generalized itching since this morning. Seen in ED for same on 6/23 where lab work was obtained and reassuring. Afebrile and hemodynamically stable. No rash or lesions of skin. Informed patient plan of medical clearance labs- patient states he just had blood work done last week in ED and he does not want this repeated. Urine obtained and reviewed. Offered IV fluids, but patient declined. Oral rehydration in ED performed. No SI/HI.   Patient re-evaluated and feels much improved after Benadryl, valium. Itching resolved. Ambulating  throughout ED without difficulty. Will discharge home with librium taper. Inpatient and outpatient resource guides provided. Return precautions given and all questions answered.   Patient discussed with Dr. Oleta Mouse who agrees with treatment plan.    [facility] Dicky Boer, PA-C 04/09/16 1906  Forde Dandy, MD 04/10/16 [phone]

## 2016-04-09 NOTE — Discharge Instructions (Signed)
Return to ER for new or worsening symptoms, any additional concerns.   Liz Claiborne Guide Inpatient Behavioral Health/Residential  Substance Abuse Treatment Adults The United Ways 211 is a great source of information about community services available.  Access by dialing 2-1-1 from anywhere in [place], or by website -  CustodianSupply.fi.   (Updated 10/2015)  Crisis Assistance 24 hours a day   [place] [facility]  24-hour crisis assistance: [place], [place]   [facility]  24-hour crisis [place], [place]   24-hour crisis assistance: [place], [place] Access to Care Line  24-hour crisis assistance; 6014252343 All   [facility]  24-hour crisis response line: [phone] All   Other Local Resources (Updated 10/2015)  Inpatient Behavioral Health/Residential Substance Abuse Treatment Programs   Services      Address and Phone Number  [place] ([place])   14-day residential rehabilitation  [phone] [address] [place], [place] ([place])    Detox - private pay only  14-day residential rehabilitation -  Medicaid, insurance, private pay only [phone], or [place], [place], [place] [address]   [place] only  Multiple facilities [phone] admissions   [facility] ([place])   90-day program  Must be homeless to participate  [phone], or [place], [place]     Must make an appointment  Transportation is offered from [place] on [employer].  Accepts private pay, Elvin So [facility] [phone]  [address] [place], [place] [address]   [place]     Detox  Residential rehabilitation  Private insurance only  Multiple locations [phone] admissions  [place] pay  Private insurance [phone] [address] [place] [address]  [facility]    Males only  Fee required at time of admission [place], [place] [address]  [place] pay only  [phone] [address] [place], [place] (Residential Treatment Services)    Detox - private pay, Medicaid  Residential rehabilitation for males  - Medicare, Medicaid, insurance, private pay [phone] [place], [place] interviews Monday - Saturday from 8 am - 4 pm  Individuals with legal charges are not eligible [phone] [address] [place], [place] [address]  [facility]   Must be willing to work  Must attend Alcoholics Anonymous meetings [phone] [place], [place] program  Private pay only [phone] [place], [place] Counseling/Substance Abuse Adult The United Ways 211 is a great source of information about community services available.  Access by dialing 2-1-1 from anywhere in [place], or by website -  CustodianSupply.fi.   Other Local Resources (Updated 10/2015)  [place] [employer]  Crisis Hotline, available 24 hours a day, 7 days a week: [place], [place]   [facility]  Crisis Hotline, available 24 hours a day, 7 days a week: [place], [place]  [facility]  Suicide Prevention Hotline, available 24 hours a day, 7 days a week: 479-097-5161  [place], [place], available 24 hours a day, 7 days a week: [place], [place] Access to [place], available 24 hours a day, 7 days a week: [phone] All   [facility]  Crisis Hotline, available 24 hours a day, 7 days a week: 778-308-3764 All   Other Local Resources (Updated 10/2015)  Outpatient Counseling/ Substance Abuse Programs  Services     Address and Phone Number  ADS ([facility] Drug Services)   Options include Individual counseling, group counseling, intensive outpatient program (several hours a day, several days a week)  Offers depression assessments  Provides methadone maintenance program [phone] [address] [place], [place] partial hospitalization/day treatment and DUI/DWI programs  [employer], private insurance [phone] [address] [place], [place] [address]  [place] include intensive outpatient program (several hours a day, several days a week), outpatient treatment, DUI/DWI services, family education  Also has some services specifically for [employer] transitional housing  [phone] [address], [place] [address]     [place] Medicare, private pay, and private insurance [phone] [address] [place], [place] [address]  [facility]  Services include individual counseling, substance abuse intensive outpatient program (several hours a day, several days a week), day treatment  Blinda [place]ather[place], Medicaid, private insurance [phone] [address], [place], [place] [address]  [facility]   Offers substance abuse intensive outpatient program (several hours a day, several days a week), partial hospitalization program [phone] [address] [place], [place] [address]  [phone] [address] [place], [place] [address]  [phone] [place], [place] [address]  [phone], [place], [place] [address]  [facility]  Individual counseling only  Accepts private insurance only [phone] [address] [place], [place] [address]  [facility]  Methadone maintenance program [phone] [address], [place] [address]  [facility] providing substance abuse and mental health counseling  Accepts Medicaid, Medicare, private insurance  Offers sliding scale for uninsured [phone] [place], [place] in [place] individual counseling, and intensive in-home services [phone] [address], [place], [place] [address]  [facility] individual counseling, family counseling, group therapy, domestic violence counseling, consumer credit counseling  Accepts Medicare, Medicaid, private insurance  Offers sliding scale for uninsured [phone] [address] [place], [place] [address]  [phone] [facility], [address] [place], [place]  [facility]  Offers individual, family and group counseling  3 locations - [place], [place], and [place]  [address] [place], [place] [address]  [address] [place], [place] [address]  [place], [place] [address]  [facility]    Offers psychiatric assessment, 8-week Intensive Outpatient Program (several hours a day, several times a week, daytime or evenings), early recovery group, family Program, medication management  Private pay or private insurance only [phone], or  [phone] [address] [place], [place] [address]  [facility]  Offers individual, couples and family counseling  Accepts Medicaid, private insurance, and sliding scale for uninsured [phone] [address] [place],  [address]  Launa Flight, MD  Individual counseling  Private insurance [phone] [place], [place] [address]  [facility]   Offers assessment, substance abuse treatment, and behavioral health treatment [phone] [address]. [place], [place] [address]  [place]  Individual counseling  Accepts private insurance [phone], [place] [address]  [facility]  Individual counseling  Blinda Leatherwood, private insurance [phone] [place], [place] [address]  [place]    Offers intensive outpatient program (several hours a day, several times a week)  Private pay, private insurance [phone] [place], [place]  Individual counseling  Medicare, private insurance [phone] [address], [place], [place] [address]  [place]    Offers intensive outpatient program (several hours a day, several times a week) and partial hospitalization program [phone] [place], [place] [address]  Letta Moynahan, MD  Individual counseling [phone] [address], [place], [place] [address]  [place] counseling to individuals, couples, and families  Accepts Medicare and private insurance; offers sliding scale for uninsured [phone] [place], [place] [address]  [facility]  Christian counseling [phone] [address] [place], [place] [address]  [facility] [employer] crisis counseling, individual counseling, group therapy, in-home therapy, domestic violence services, day treatment, DWI services, Conservation officer, nature (CST), Assertive Community Treatment Team (ACTT), substance abuse Intensive Outpatient Program (several hours a day,  several times a week)  2 locations - [place] and [place], [place] [address]  [phone] [address] [place], [place] [address]  [place] counseling and group therapy  [place] insurance, [place], [place] [phone] [address] [place], [place]  [facility]  Offers individual and family counseling  Offers LGBTQ services  Accepts private insurance and private pay [phone] [place], [place] [address]  [facility]    Offers individual counseling, group therapy, and outpatient detox  Accepts private insurance [phone] [place], [place] Medicare, private insurance [phone] [address] [place], [place] [address]  [employer]  Individual counseling  Accepts Medicare, private insurance [phone] [address] [place] [address],  [address]  [facility] substance abuse Intensive Outpatient Program (several hours a day, several times a week) [phone], or [phone] [place], [place]

## 2016-04-12 ENCOUNTER — Encounter (HOSPITAL_COMMUNITY): Payer: Self-pay | Admitting: Emergency Medicine

## 2016-04-12 ENCOUNTER — Emergency Department (HOSPITAL_COMMUNITY)
Admission: EM | Admit: 2016-04-12 | Discharge: 2016-04-12 | Disposition: A | Discharge status: Home / Self Care | Payer: Medicare Other | Attending: Emergency Medicine | Admitting: Emergency Medicine

## 2016-04-12 DIAGNOSIS — Z85118 Personal history of other malignant neoplasm of bronchus and lung: Secondary | ICD-10-CM | POA: Insufficient documentation

## 2016-04-12 DIAGNOSIS — F17210 Nicotine dependence, cigarettes, uncomplicated: Secondary | ICD-10-CM | POA: Insufficient documentation

## 2016-04-12 DIAGNOSIS — F1010 Alcohol abuse, uncomplicated: Secondary | ICD-10-CM | POA: Diagnosis not present

## 2016-04-12 DIAGNOSIS — Z7982 Long term (current) use of aspirin: Secondary | ICD-10-CM | POA: Diagnosis not present

## 2016-04-12 DIAGNOSIS — J449 Chronic obstructive pulmonary disease, unspecified: Secondary | ICD-10-CM | POA: Diagnosis not present

## 2016-04-12 DIAGNOSIS — Z8679 Personal history of other diseases of the circulatory system: Secondary | ICD-10-CM | POA: Insufficient documentation

## 2016-04-12 DIAGNOSIS — Z79899 Other long term (current) drug therapy: Secondary | ICD-10-CM | POA: Diagnosis not present

## 2016-04-12 DIAGNOSIS — F419 Anxiety disorder, unspecified: Secondary | ICD-10-CM | POA: Diagnosis present

## 2016-04-12 MED ORDER — CHLORDIAZEPOXIDE HCL 25 MG PO CAPS
100.0000 mg | ORAL_CAPSULE | Freq: Once | ORAL | Status: AC
  Administered 2016-04-12: 100 mg via ORAL
  Filled 2016-04-12: qty 4

## 2016-04-12 NOTE — Discharge Instructions (Signed)
Community Resource Guide Outpatient Counseling/Substance Abuse Adult °The United Way’s “211” is a great source of information about community services available.  Access by dialing 2-1-1 from anywhere in [place], or by website -  www.nc211.org.  ° °Other Local Resources (Updated 10/2015) ° °Crisis Hotlines °  °Services  ° °  °Area Served  °[facility] • Crisis Hotline, available 24 hours a day, 7 days a week: [phone] [place], [place]  ° [facility] • Crisis Hotline, available 24 hours a day, 7 days a week: [phone] [place], [place]  °[facility] • Suicide Prevention Hotline, available 24 hours a day, 7 days a week: [phone] [place], [place]  °[facility] ° • Crisis Hotline, available 24 hours a day, 7 days a week: [phone] [place], [place] °  °[facility] Access to Care Line • Crisis Hotline, available 24 hours a day, 7 days a week: 800-256-2452 All °  °[facility] • Crisis Hotline, available 24 hours a day, 7 days a week: 877-626-1772 All  ° °Other Local Resources (Updated 10/2015) ° °Outpatient Counseling/ Substance Abuse Programs  °Services  ° °  °Address and Phone Number  °[facility] ([facility]cohol [facility] Drug Services) ° • Options include Individual counseling, group counseling, intensive outpatient program (several hours a day, several days a week) °• Offers depression assessments °• Provides methadone maintenance program [phone] °[address] °[place], [place] [address] °  °[facility] ° • Offers partial hospitalization/day treatment and DUI/DWI programs °• Accepts Medicare, private insurance [phone] °[address] °[place], [place] [address]  °[facility] ° ° • Services include intensive outpatient program (several hours a day, several days a week), outpatient treatment, DUI/DWI services, family education °• Also has some services specifically for Veterans °• Offers transitional housing   [phone] °[address] °[place], [place] [address] °  °  °[facility] • Accepts Medicare, private pay, and private insurance [phone] °[address] °[place], [place] [address]  °[facility] • Services include individual counseling, substance abuse intensive outpatient program (several hours a day, several days a week), day treatment °• Accepts Medicare, Medicaid, private insurance [phone] °[address] °[place], [place] [address]  °[facility] ° • Offers substance abuse intensive outpatient program (several hours a day, several days a week), partial hospitalization program [phone] °[address] °[place], [place] [address] ° °[phone] °[address] °[place], [place] [address] ° °[phone] °[address] °[place], [place] [address] ° °[phone] °[address] °[place], [place] [address]  °[facility] • Individual counseling only °• Accepts private insurance only [phone] °[address] °[place], [place] [address]  °[facility] • Methadone maintenance program [phone] °[address] °[place], [place] [address]  °[facility] • Walk-In Clinic providing substance abuse and mental health counseling °• Accepts Medicaid, Medicare, private insurance °• Offers sliding scale for uninsured [phone] °[address] °[place], [place]   °[facility] • Offers individual counseling, and intensive in-home services [phone] °[address] °[place], [place] [address]  °[facility] • Offers individual counseling, family counseling, group therapy, domestic violence counseling, consumer credit counseling °• Accepts Medicare, Medicaid, private insurance °• Offers sliding scale for uninsured [phone] °[address] °[place],  [address] ° °[phone] °[place], [address] °[place],  [address]  °[facility] • Offers individual, family   and group counseling °• 3 locations - [place], [place], and [place] ° [phone] ° °[address] °[place], [place] [address] ° °[address] °[place], [place] [address] ° °[address] °[place], [place] [address]  °[facility]  ° • Offers psychiatric assessment, 8-week Intensive Outpatient Program (several hours a day, several times a week, daytime or evenings), early recovery group, family Program, medication management °• Private pay or private insurance only [phone], or  °[phone] °[address] °[place], [place] [address]  °[facility] • Offers individual, couples and family counseling °• Accepts Medicaid, private insurance, and sliding scale for uninsured 336-542-2076 °[address] °[place], [place] [address]  °David Fuller, MD • Individual counseling °• Private insurance [phone] °[address] °[place], [place] [address]  °[facility] ° • Offers assessment, substance abuse treatment, and behavioral health treatment [phone] °[address] °[place], [place] [address]  °[facility] • Individual counseling °• Accepts private insurance [phone] °[address] °[place], [place] [address]  °[facility] • Individual counseling °• Accepts Medicare, private insurance [phone] °[address] °[place], [place] [address]  °[facility]  ° • Offers intensive outpatient program (several hours a day, several times a week) °• Private pay, private insurance [phone] °[address] °[place], [place]  °[facility] • Individual counseling °• Medicare, private insurance [phone] °[address] °[place], [place] [address]  °[facility]  ° • Offers intensive outpatient program (several hours a day, several times a week) and partial hospitalization  program [phone] °[address] °[place], [place] [address]  °Parrish McKinney, MD • Individual counseling 336-282-1251 °[address] °[place], [place] [address]  °[facility] • Offers Christian counseling to individuals, couples, and families °• Accepts Medicare and private insurance; offers sliding scale for uninsured 336-288-1484 °[address] °[place], [place] [address]  °[facility] • Christian counseling 336-542-2060 °[address] °[place], [place] [address]  °[facility] ° • Offers crisis counseling, individual counseling, group therapy, in-home therapy, domestic violence services, day treatment, DWI services, Community Support Team (CST), Assertive Community Treatment Team (ACTT), substance abuse Intensive Outpatient Program (several hours a day, several times a week) °• 2 locations - [place] and [place] [phone] °[address] °[place], [place] [address] ° °336-694-1777 °[address] °[place], [place] [address]  °[facility]  ° ° • Individual counseling and group therapy °• Accepts private insurance, Medicare, Medicaid [phone] °[address] °[place], [place]  °[facility] • Offers individual and family counseling °• Offers LGBTQ services °• Accepts private insurance and private pay 336-288-9190 °[address] °[place], [place] [address]  °[facility]  ° • Offers individual counseling, group therapy, and outpatient detox °• Accepts private insurance [phone] °[address] °[place], [place]  °[facility] • Individual counseling °• Accepts Medicare, private insurance 336-632-3505 °[address] °[place], [place] [address]  °[facility] • Individual counseling °• Accepts Medicare, private insurance [phone] °[address] °[place], [place] [address]  °[facility] ° • Offers substance abuse  Intensive Outpatient Program (several hours a day, several times a week) [phone], or °[phone] °[place], [place]  ° °

## 2016-04-12 NOTE — ED Notes (Signed)
Brought in by EMS from home with c/o anxiety.  Per EMS, pt reported that he has been feeling "edgy" and "restless and anxious"---- stated that his Lorazepam "is not doing any good".

## 2016-04-12 NOTE — ED Provider Notes (Signed)
CSN: [id number]     Arrival date & time 04/12/16  7106 History   First MD Initiated Contact with Patient 04/12/16 [phone]     Chief Complaint  Patient presents with  . Anxiety     (Consider location/radiation/quality/duration/timing/severity/associated sxs/prior Treatment) Patient is a 70 y.o. male presenting with anxiety. The history is provided by the patient.  Anxiety This is a new problem. The current episode started more than 1 week ago. The problem occurs constantly. The problem has not changed since onset.Pertinent negatives include no chest pain, no abdominal pain, no headaches and no shortness of breath. Nothing aggravates the symptoms. Nothing relieves the symptoms. He has tried nothing for the symptoms. The treatment provided no relief.   70 yo M with a cc of anxiety.  This is an ongoing problem, the patient has run out of his ativan and is requesting a valium shot that was given to him yesterday.  He thinks this helped transiently.  Continues to drink every day.    Past Medical History  Diagnosis Date  . Anxiety   . PTSD (post-traumatic stress disorder)   . PAD (peripheral artery disease) ([place])   . COPD (chronic obstructive pulmonary disease) ([place])   . Cancer ([place])   . Lung cancer [facility])    History reviewed. No pertinent past surgical history. Family History  Problem Relation Age of Onset  . Heart attack Father    Social History  Substance Use Topics  . Smoking status: Current Every Day Smoker -- 1.50 packs/day for 30 years    Types: Cigarettes  . Smokeless tobacco: Never Used  . Alcohol Use: 50.4 oz/week    84 Cans of beer per week     Comment: 12 or more beers/day    Review of Systems  Constitutional: Negative for fever and chills.  HENT: Negative for congestion and facial swelling.   Eyes: Negative for discharge and visual disturbance.  Respiratory: Negative for shortness of breath.   Cardiovascular: Negative for chest pain and palpitations.  Gastrointestinal:  Negative for vomiting, abdominal pain and diarrhea.  Musculoskeletal: Negative for myalgias and arthralgias.  Skin: Negative for color change and rash.  Neurological: Negative for tremors, syncope and headaches.  Psychiatric/Behavioral: Negative for confusion and dysphoric mood.      Allergies  Review of patient's allergies indicates no known allergies.  Home Medications   Prior to Admission medications   Medication Sig Start Date End Date Taking? Authorizing Provider  acetaminophen (TYLENOL) 500 MG tablet Take 1,000 mg by mouth daily. For pain    Historical Provider, MD  Aspirin-Acetaminophen-Caffeine 260-130-16 MG TABS Take 1 packet by mouth 4 (four) times daily.     Historical Provider, MD  chlordiazePOXIDE (LIBRIUM) 25 MG capsule '50mg'$  PO TID x 1D, then 25-'50mg'$  PO BID X 1D, then 25-'50mg'$  PO QD X 1D 04/09/16   [facility] Ward, PA-C  hydrOXYzine (ATARAX/VISTARIL) 25 MG tablet Take 1 tablet (25 mg total) by mouth every 6 (six) hours. Patient not taking: Reported on 02/18/2016 06/30/15   Orpah Greek, MD  hydrOXYzine (VISTARIL) 25 MG capsule Take 25 mg by mouth 3 (three) times daily as needed. itching 03/27/16 04/26/16  Historical Provider, MD  LORazepam (ATIVAN) 0.5 MG tablet Take 0.5 mg by mouth daily as needed. Anxiety *use sparingly* 03/19/16   Historical Provider, MD  LORazepam (ATIVAN) 1 MG tablet Take 1 tablet (1 mg total) by mouth 3 (three) times daily as needed for anxiety. Patient not taking: Reported on 04/04/2016 11/02/15  Alfonzo Beers, MD  sertraline (ZOLOFT) 50 MG tablet Take 50 mg by mouth daily. Reported on 04/04/2016 03/27/16 03/27/17  Historical Provider, MD  traZODone (DESYREL) 100 MG tablet Take 100 mg by mouth at bedtime as needed for sleep.     Historical Provider, MD   BP 152/66 mmHg  Pulse 65  Temp(Src) 97.4 F (36.3 C) (Oral)  Resp 20  SpO2 100% Physical Exam  Constitutional: He is oriented to person, place, and time. He appears well-developed and  well-nourished.  HENT:  Head: Normocephalic and atraumatic.  Eyes: EOM are normal. Pupils are equal, round, and reactive to light.  Neck: Normal range of motion. Neck supple. No JVD present.  Cardiovascular: Normal rate and regular rhythm.  Exam reveals no gallop and no friction rub.   No murmur heard. Pulmonary/Chest: No respiratory distress. He has no wheezes.  Abdominal: He exhibits no distension. There is no tenderness. There is no rebound and no guarding.  Musculoskeletal: Normal range of motion.  Neurological: He is alert and oriented to person, place, and time.  Skin: No rash noted. No pallor.  Psychiatric: He has a normal mood and affect. His behavior is normal.  Nursing note and vitals reviewed.   ED Course  Procedures (including critical care time) Labs Review Labs Reviewed - No data to display  Imaging Review No results found. I have personally reviewed and evaluated these images and lab results as part of my medical decision-making.   EKG Interpretation None      MDM   Final diagnoses:  Alcohol abuse    70 yo M With a chief complaint of anxiety. Was seen here yesterday for the same. Was seen this week for the same as well. Had negative labs done at the initial visit. I cannot think of a reason to repeat today. The patient has normal vitals is not tachycardic hypertensive or showing any other signs of acute withdrawal. Patient seems to be here specifically to have a Valium injection. He was prescribed Librium yesterday but did not get it filled. We'll give him Librium and have him follow-up with an outpatient resources.   7:43 AM:  I have discussed the diagnosis/risks/treatment options with the patient and believe the pt to be eligible for discharge home to follow-up with Outpatient withdrawal center. We also discussed returning to the ED immediately if new or worsening sx occur. We discussed the sx which are most concerning (e.g., seizure, worsening symptoms) that  necessitate immediate return. Medications administered to the patient during their visit and any new prescriptions provided to the patient are listed below.  Medications given during this visit Medications  chlordiazePOXIDE (LIBRIUM) capsule 100 mg (100 mg Oral Given 04/12/16 0740)    New Prescriptions   No medications on file    The patient appears reasonably screen and/or stabilized for discharge and I doubt any other medical condition or other [facility] requiring further screening, evaluation, or treatment in the ED at this time prior to discharge.     Deno Etienne, DO 04/12/16 [phone]

## 2016-06-07 ENCOUNTER — Emergency Department (HOSPITAL_COMMUNITY)
Admission: EM | Admit: 2016-06-07 | Discharge: 2016-06-07 | Disposition: A | Discharge status: Home / Self Care | Payer: Medicare Other | Attending: Emergency Medicine | Admitting: Emergency Medicine

## 2016-06-07 ENCOUNTER — Encounter (HOSPITAL_COMMUNITY): Payer: Self-pay

## 2016-06-07 DIAGNOSIS — Z85118 Personal history of other malignant neoplasm of bronchus and lung: Secondary | ICD-10-CM | POA: Insufficient documentation

## 2016-06-07 DIAGNOSIS — F17210 Nicotine dependence, cigarettes, uncomplicated: Secondary | ICD-10-CM | POA: Insufficient documentation

## 2016-06-07 DIAGNOSIS — Z79899 Other long term (current) drug therapy: Secondary | ICD-10-CM | POA: Diagnosis not present

## 2016-06-07 DIAGNOSIS — F10129 Alcohol abuse with intoxication, unspecified: Secondary | ICD-10-CM | POA: Diagnosis present

## 2016-06-07 DIAGNOSIS — J449 Chronic obstructive pulmonary disease, unspecified: Secondary | ICD-10-CM | POA: Diagnosis not present

## 2016-06-07 DIAGNOSIS — F1010 Alcohol abuse, uncomplicated: Secondary | ICD-10-CM | POA: Diagnosis not present

## 2016-06-07 NOTE — ED Notes (Signed)
Patient understood discharge instructions.

## 2016-06-07 NOTE — ED Provider Notes (Signed)
[place] DEPT Provider Note   CSN: [id number] Arrival date & time: 06/07/16  2585     History   Chief Complaint Chief Complaint  Patient presents with  . Alcohol Intoxication    HPI David Lucero is a 70 y.o. male.  HPI Patient presents emergency department because he ran out of his benzodiazepines.  He feels like there might be bugs crawling on him.  He is requesting a benzodiazepine.  He is prescribed lorazepam and reports that he ran out early because he sometimes takes more than he is supposed to.  He admits to drinking alcohol today.  He has a long-standing history of alcohol abuse.  He has no other complaints.  No fevers or chills.  No abdominal pain.  No visual hallucinations.   Past Medical History:  Diagnosis Date  . Anxiety   . Cancer ([place])   . COPD (chronic obstructive pulmonary disease) ([place])   . Lung cancer ([place])   . PAD (peripheral artery disease) ([place])   . PTSD (post-traumatic stress disorder)     Patient Active Problem List   Diagnosis Date Noted  . PAD (peripheral artery disease) ([place]) 02/14/2014  . Chest pain 11/03/2013  . UNSPECIFIED HYPOTHYROIDISM 09/24/2008  . MORBID OBESITY 09/24/2008  . Alcohol abuse 09/24/2008  . PTSD 09/24/2008  . COPD 09/24/2008    History reviewed. No pertinent surgical history.     Home Medications    Prior to Admission medications   Medication Sig Start Date End Date Taking? Authorizing Provider  acetaminophen (TYLENOL) 500 MG tablet Take 1,000 mg by mouth daily. For pain    Historical Provider, MD  Aspirin-Acetaminophen-Caffeine 260-130-16 MG TABS Take 1 packet by mouth 4 (four) times daily.     Historical Provider, MD  chlordiazePOXIDE (LIBRIUM) 25 MG capsule '50mg'$  PO TID x 1D, then 25-'50mg'$  PO BID X 1D, then 25-'50mg'$  PO QD X 1D 04/09/16   [facility] Ward, PA-C  hydrOXYzine (ATARAX/VISTARIL) 25 MG tablet Take 1 tablet (25 mg total) by mouth every 6 (six) hours. Patient not taking: Reported on 02/18/2016  06/30/15   Orpah Greek, MD  LORazepam (ATIVAN) 0.5 MG tablet Take 0.5 mg by mouth daily as needed. Anxiety *use sparingly* 03/19/16   Historical Provider, MD  LORazepam (ATIVAN) 1 MG tablet Take 1 tablet (1 mg total) by mouth 3 (three) times daily as needed for anxiety. Patient not taking: Reported on 04/04/2016 11/02/15   Alfonzo Beers, MD  sertraline (ZOLOFT) 50 MG tablet Take 50 mg by mouth daily. Reported on 04/04/2016 03/27/16 03/27/17  Historical Provider, MD  traZODone (DESYREL) 100 MG tablet Take 100 mg by mouth at bedtime as needed for sleep.     Historical Provider, MD    Family History Family History  Problem Relation Age of Onset  . Heart attack Father     Social History Social History  Substance Use Topics  . Smoking status: Current Every Day Smoker    Packs/day: 1.50    Years: 30.00    Types: Cigarettes  . Smokeless tobacco: Never Used  . Alcohol use 50.4 oz/week    84 Cans of beer per week     Comment: 12 or more beers/day     Allergies   Review of patient's allergies indicates no known allergies.   Review of Systems Review of Systems  All other systems reviewed and are negative.    Physical Exam Updated Vital Signs BP 157/71   Pulse 70   Temp 97.5 F (36.4  C) (Oral)   Resp 16   SpO2 100%   Physical Exam  Constitutional: He is oriented to person, place, and time. He appears well-developed and well-nourished.  HENT:  Head: Normocephalic.  Eyes: EOM are normal.  Neck: Normal range of motion.  Cardiovascular: Normal rate.   Pulmonary/Chest: Effort normal and breath sounds normal.  Abdominal: Soft. There is no tenderness.  Musculoskeletal: Normal range of motion.  Neurological: He is alert and oriented to person, place, and time.  Psychiatric: He has a normal mood and affect.  Nursing note and vitals reviewed.    ED Treatments / Results  Labs (all labs ordered are listed, but only abnormal results are displayed) Labs Reviewed - No data to  display  EKG  EKG Interpretation None       Radiology No results found.  Procedures Procedures (including critical care time)  Medications Ordered in ED Medications - No data to display   Initial Impression / Assessment and Plan / ED Course  I have reviewed the triage vital signs and the nursing notes.  Pertinent labs & imaging results that were available during my care of the patient were reviewed by me and considered in my medical decision making (see chart for details).  Clinical Course  Patient is well-appearing at this time.  Vital signs are normal.  Patient admits to drinking alcohol today.  He'll need to speak further with his primary care physician about a refill for his lorazepam.  I do not feel comfortable giving the patient benzodiazepines here in emergency department given his admitted use of alcohol today.  Discharged in emergency department in good condition.  Medical screening examination completed.     Final Clinical Impressions(s) / ED Diagnoses   Final diagnoses:  Alcohol abuse    New Prescriptions New Prescriptions   No medications on file     Jola Schmidt, MD 06/07/16 [phone]

## 2016-06-07 NOTE — ED Triage Notes (Signed)
Per EMS patient called 911 due to drinking since yesterday afternoon.  Per EMS patient indicated that he had a Hx of anxiety and "things" crawling all over him that he takes ativan for.  Patient stated that he has been out of his ativan.

## 2016-06-07 NOTE — ED Notes (Signed)
Bed: FS14 Expected date:  Expected time:  Means of arrival:  Comments: ETOH

## 2016-06-09 ENCOUNTER — Emergency Department (HOSPITAL_COMMUNITY)
Admission: EM | Admit: 2016-06-09 | Discharge: 2016-06-09 | Disposition: A | Discharge status: Home / Self Care | Payer: Medicare Other | Attending: Emergency Medicine | Admitting: Emergency Medicine

## 2016-06-09 ENCOUNTER — Encounter (HOSPITAL_COMMUNITY): Payer: Self-pay

## 2016-06-09 DIAGNOSIS — Z760 Encounter for issue of repeat prescription: Secondary | ICD-10-CM | POA: Insufficient documentation

## 2016-06-09 DIAGNOSIS — J449 Chronic obstructive pulmonary disease, unspecified: Secondary | ICD-10-CM | POA: Insufficient documentation

## 2016-06-09 DIAGNOSIS — R079 Chest pain, unspecified: Secondary | ICD-10-CM | POA: Insufficient documentation

## 2016-06-09 DIAGNOSIS — E039 Hypothyroidism, unspecified: Secondary | ICD-10-CM | POA: Diagnosis not present

## 2016-06-09 DIAGNOSIS — Z79899 Other long term (current) drug therapy: Secondary | ICD-10-CM | POA: Diagnosis not present

## 2016-06-09 DIAGNOSIS — F17210 Nicotine dependence, cigarettes, uncomplicated: Secondary | ICD-10-CM | POA: Insufficient documentation

## 2016-06-09 DIAGNOSIS — F1020 Alcohol dependence, uncomplicated: Secondary | ICD-10-CM | POA: Insufficient documentation

## 2016-06-09 DIAGNOSIS — Z85118 Personal history of other malignant neoplasm of bronchus and lung: Secondary | ICD-10-CM | POA: Diagnosis not present

## 2016-06-09 HISTORY — DX: Alcohol abuse, uncomplicated: F10.10

## 2016-06-09 HISTORY — DX: Alcohol abuse, in remission: F10.11

## 2016-06-09 HISTORY — DX: Gastro-esophageal reflux disease without esophagitis: K21.9

## 2016-06-09 LAB — CBC WITH DIFFERENTIAL/PLATELET
BASOS ABS: 0.1 10*3/uL (ref 0.0–0.1)
BASOS PCT: 1 %
EOS ABS: 0.3 10*3/uL (ref 0.0–0.7)
Eosinophils Relative: 7 %
HCT: 45.9 % (ref 39.0–52.0)
HEMOGLOBIN: 14.7 g/dL (ref 13.0–17.0)
LYMPHS ABS: 1.6 10*3/uL (ref 0.7–4.0)
Lymphocytes Relative: 33 %
MCH: 30.1 pg (ref 26.0–34.0)
MCHC: 32 g/dL (ref 30.0–36.0)
MCV: 94.1 fL (ref 78.0–100.0)
Monocytes Absolute: 0.4 10*3/uL (ref 0.1–1.0)
Monocytes Relative: 8 %
NEUTROS PCT: 51 %
Neutro Abs: 2.4 10*3/uL (ref 1.7–7.7)
PLATELETS: 211 10*3/uL (ref 150–400)
RBC: 4.88 MIL/uL (ref 4.22–5.81)
RDW: 13.9 % (ref 11.5–15.5)
WBC: 4.7 10*3/uL (ref 4.0–10.5)

## 2016-06-09 LAB — ETHANOL

## 2016-06-09 LAB — COMPREHENSIVE METABOLIC PANEL
ALT: 11 U/L — AB (ref 17–63)
AST: 15 U/L (ref 15–41)
Albumin: 4 g/dL (ref 3.5–5.0)
Alkaline Phosphatase: 52 U/L (ref 38–126)
Anion gap: 6 (ref 5–15)
BILIRUBIN TOTAL: 0.8 mg/dL (ref 0.3–1.2)
BUN: 13 mg/dL (ref 6–20)
CALCIUM: 9.1 mg/dL (ref 8.9–10.3)
CHLORIDE: 105 mmol/L (ref 101–111)
CO2: 26 mmol/L (ref 22–32)
CREATININE: 0.74 mg/dL (ref 0.61–1.24)
Glucose, Bld: 85 mg/dL (ref 65–99)
Potassium: 4 mmol/L (ref 3.5–5.1)
Sodium: 137 mmol/L (ref 135–145)
TOTAL PROTEIN: 7.3 g/dL (ref 6.5–8.1)

## 2016-06-09 MED ORDER — LORAZEPAM 1 MG PO TABS
0.5000 mg | ORAL_TABLET | Freq: Every day | ORAL | 0 refills | Status: DC | PRN
Start: 2016-06-09 — End: 2016-12-04

## 2016-06-09 MED ORDER — LORAZEPAM 1 MG PO TABS
1.0000 mg | ORAL_TABLET | Freq: Once | ORAL | Status: AC
  Administered 2016-06-09: 1 mg via ORAL
  Filled 2016-06-09: qty 1

## 2016-06-09 NOTE — ED Notes (Signed)
MD at bedside. EDP J PRESENT. Pt states he now will have blood drawn. Medication will be given. Pt can eat.

## 2016-06-09 NOTE — Discharge Instructions (Signed)
Call any of the numbers that you were furnished to get help with your alcohol problem. Call Dr. Fayrene Fearing office today to arrange to be seen this week. Tell office staff that Dr. Winfred Leeds spoke with Ms .Carter-Spencer in scheduling the appointment .You should call Dr. Melford Aase for all medication refills

## 2016-06-09 NOTE — ED Notes (Signed)
Pt went home by cab

## 2016-06-09 NOTE — ED Notes (Signed)
Pt states out of ATIVAN x 1 week. Seen here several days ago for presenting complaint. Pt requesting detox also.

## 2016-06-09 NOTE — ED Notes (Signed)
Bed: EM33 Expected date:  Expected time:  Means of arrival:  Comments: 66M/detox

## 2016-06-09 NOTE — ED Notes (Signed)
MD at bedside. EDP J PRESENT. DISCHARGE INSTRUCTIONS REVIEWED

## 2016-06-09 NOTE — ED Provider Notes (Signed)
[place] DEPT Provider Note   CSN: [id number] Arrival date & time: 06/09/16  0805     History   Chief Complaint Chief Complaint  Patient presents with  . Alcohol Intoxication  . Detox  . Medication Refill    HPI David Lucero is a 70 y.o. male.Patient states he wants help with his drinking. He last drank at 5 AM today. He feels like "hairs are all over me." He last took Ativan one week ago. He ran out of his prescribed Ativan. Patient had Ativan 0.5 mg 1 tablet daily prescribed July 13, 30 tablets from his primary care physician Dr. Anastasia Pall. Denies nausea or vomiting. He is presently hungry HPI  Past Medical History:  Diagnosis Date  . Acid reflux    from throat cancer  . Anxiety   . Cancer ([place])   . COPD (chronic obstructive pulmonary disease) ([place])   . ETOH abuse   . H/O ETOH abuse   . Lung cancer ([place])   . PAD (peripheral artery disease) ([place])   . PTSD (post-traumatic stress disorder)     Patient Active Problem List   Diagnosis Date Noted  . PAD (peripheral artery disease) ([place]) 02/14/2014  . Chest pain 11/03/2013  . UNSPECIFIED HYPOTHYROIDISM 09/24/2008  . MORBID OBESITY 09/24/2008  . Alcohol abuse 09/24/2008  . PTSD 09/24/2008  . COPD 09/24/2008    No past surgical history on file.     Home Medications    Prior to Admission medications   Medication Sig Start Date End Date Taking? Authorizing Provider  acetaminophen (TYLENOL) 500 MG tablet Take 1,000 mg by mouth daily. For pain   Yes Historical Provider, MD  LORazepam (ATIVAN) 0.5 MG tablet Take 0.5 mg by mouth daily as needed for anxiety. *use sparingly* 03/19/16  Yes Historical Provider, MD  traZODone (DESYREL) 100 MG tablet Take 100 mg by mouth at bedtime as needed for sleep.    Yes Historical Provider, MD  chlordiazePOXIDE (LIBRIUM) 25 MG capsule '50mg'$  PO TID x 1D, then 25-'50mg'$  PO BID X 1D, then 25-'50mg'$  PO QD X 1D Patient not taking: Reported on 06/09/2016 04/09/16   [facility] Ward,  PA-C  hydrOXYzine (ATARAX/VISTARIL) 25 MG tablet Take 1 tablet (25 mg total) by mouth every 6 (six) hours. Patient not taking: Reported on 02/18/2016 06/30/15   Orpah Greek, MD  LORazepam (ATIVAN) 1 MG tablet Take 1 tablet (1 mg total) by mouth 3 (three) times daily as needed for anxiety. Patient not taking: Reported on 04/04/2016 11/02/15   Alfonzo Beers, MD  sertraline (ZOLOFT) 50 MG tablet Take 50 mg by mouth daily. Reported on 04/04/2016 03/27/16 03/27/17  Historical Provider, MD    Family History Family History  Problem Relation Age of Onset  . Heart attack Father     Social History Social History  Substance Use Topics  . Smoking status: Current Every Day Smoker    Packs/day: 1.50    Years: 30.00    Types: Cigarettes  . Smokeless tobacco: Never Used  . Alcohol use 50.4 oz/week    84 Cans of beer per week     Comment: 12 or more beers/day     Allergies   Review of patient's allergies indicates no known allergies.   Review of Systems Review of Systems  Constitutional: Negative.   HENT: Negative.   Respiratory: Negative.   Cardiovascular: Positive for chest pain.       Syncope  Gastrointestinal: Negative.   Musculoskeletal: Negative.   Skin: Negative.  Allergic/Immunologic: Positive for immunocompromised state.       Alcoholic  Neurological: Negative.   Psychiatric/Behavioral: Negative.   All other systems reviewed and are negative.    Physical Exam Updated Vital Signs BP 131/75   Pulse 69   Temp 97.5 F (36.4 C) (Oral)   Resp 16   SpO2 100%   Physical Exam  Constitutional: He appears well-developed and well-nourished. No distress.  HENT:  Head: Normocephalic and atraumatic.  Eyes: Conjunctivae are normal. Pupils are equal, round, and reactive to light.  Neck: Neck supple. No tracheal deviation present. No thyromegaly present.  Cardiovascular: Normal rate and regular rhythm.   No murmur heard. Pulmonary/Chest: Effort normal and breath sounds  normal.  Abdominal: Soft. Bowel sounds are normal. He exhibits no distension. There is no tenderness.  Musculoskeletal: Normal range of motion. He exhibits no edema or tenderness.  Neurological: He is alert. Coordination normal.  Skin: Skin is warm and dry. No rash noted.  Psychiatric: He has a normal mood and affect.  Nursing note and vitals reviewed.    ED Treatments / Results  Labs (all labs ordered are listed, but only abnormal results are displayed) Labs Reviewed  ETHANOL  COMPREHENSIVE METABOLIC PANEL  CBC WITH DIFFERENTIAL/PLATELET    EKG  EKG Interpretation None       Radiology No results found.  Procedures Procedures (including critical care time) Results for orders placed or performed during the hospital encounter of 06/09/16  Ethanol  Result Value Ref Range   Alcohol, Ethyl (B) <5 <5 mg/dL  Comprehensive metabolic panel  Result Value Ref Range   Sodium 137 135 - 145 mmol/L   Potassium 4.0 3.5 - 5.1 mmol/L   Chloride 105 101 - 111 mmol/L   CO2 26 22 - 32 mmol/L   Glucose, Bld 85 65 - 99 mg/dL   BUN 13 6 - 20 mg/dL   Creatinine, Ser 0.74 0.61 - 1.24 mg/dL   Calcium 9.1 8.9 - 10.3 mg/dL   Total Protein 7.3 6.5 - 8.1 g/dL   Albumin 4.0 3.5 - 5.0 g/dL   AST 15 15 - 41 U/L   ALT 11 (L) 17 - 63 U/L   Alkaline Phosphatase 52 38 - 126 U/L   Total Bilirubin 0.8 0.3 - 1.2 mg/dL   GFR calc non Af Amer >60 >60 mL/min   GFR calc Af Amer >60 >60 mL/min   Anion gap 6 5 - 15  CBC with Differential/Platelet  Result Value Ref Range   WBC 4.7 4.0 - 10.5 K/uL   RBC 4.88 4.22 - 5.81 MIL/uL   Hemoglobin 14.7 13.0 - 17.0 g/dL   HCT 45.9 39.0 - 52.0 %   MCV 94.1 78.0 - 100.0 fL   MCH 30.1 26.0 - 34.0 pg   MCHC 32.0 30.0 - 36.0 g/dL   RDW 13.9 11.5 - 15.5 %   Platelets 211 150 - 400 K/uL   Neutrophils Relative % 51 %   Neutro Abs 2.4 1.7 - 7.7 K/uL   Lymphocytes Relative 33 %   Lymphs Abs 1.6 0.7 - 4.0 K/uL   Monocytes Relative 8 %   Monocytes Absolute 0.4 0.1 -  1.0 K/uL   Eosinophils Relative 7 %   Eosinophils Absolute 0.3 0.0 - 0.7 K/uL   Basophils Relative 1 %   Basophils Absolute 0.1 0.0 - 0.1 K/uL   No results found. Medications Ordered in ED Medications  LORazepam (ATIVAN) tablet 1 mg (not administered)     Initial  Impression / Assessment and Plan / ED Course  I have reviewed the triage vital signs and the nursing notes.  Pertinent labs & imaging results that were available during my care of the patient were reviewed by me and considered in my medical decision making (see chart for details).  Clinical Course    11:30 AM feels improved after treatment with oral Ativan. He ate a full meal here and ambulates without difficulty Discussed case with Ms. Fulton Mole, nurse practitioner for Dr. Melford Aase. Plan prescription Ativan 0.5 mg daily dispense 7 days supply. Patient has been furnished with places where he can get alcohol counseling and detox as outpatient or inpatient. He is not experiencing acute alcohol withdrawal present. Nor does he appear to be withdrawing from benzodiazepines. He feels ready to go home presently  Final Clinical Impressions(s) / ED Diagnoses   Final diagnoses:  None  Diagnosis #1 chronic alcoholism #2 medication refill   New Prescriptions New Prescriptions   No medications on file     [name], MD 06/09/16 1136

## 2016-06-09 NOTE — ED Notes (Signed)
Dr Lenna Sciara went into room and talked to patient about lab work,  Pt ok'd. Went to room, patient refused once again.

## 2016-06-09 NOTE — ED Triage Notes (Signed)
Per [facility]- Pt presents with ETOH intoxication. HX of the same. Last drink 12 am. Chronic ETOH abuse. Here for DETOX. Denies SI/HI. No other complaints

## 2016-06-09 NOTE — ED Notes (Signed)
MD at bedside. TTS DAVID PRESENT SPEAKING WITH PT

## 2016-06-09 NOTE — ED Notes (Signed)
Pt refused lab work.  RN aware.

## 2016-07-03 ENCOUNTER — Emergency Department (HOSPITAL_COMMUNITY)
Admission: EM | Admit: 2016-07-03 | Discharge: 2016-07-03 | Disposition: A | Discharge status: Home / Self Care | Payer: Medicare Other | Attending: Emergency Medicine | Admitting: Emergency Medicine

## 2016-07-03 ENCOUNTER — Encounter (HOSPITAL_COMMUNITY): Payer: Self-pay | Admitting: Emergency Medicine

## 2016-07-03 DIAGNOSIS — F419 Anxiety disorder, unspecified: Secondary | ICD-10-CM | POA: Diagnosis not present

## 2016-07-03 DIAGNOSIS — Z85118 Personal history of other malignant neoplasm of bronchus and lung: Secondary | ICD-10-CM | POA: Insufficient documentation

## 2016-07-03 DIAGNOSIS — F17210 Nicotine dependence, cigarettes, uncomplicated: Secondary | ICD-10-CM | POA: Insufficient documentation

## 2016-07-03 DIAGNOSIS — J449 Chronic obstructive pulmonary disease, unspecified: Secondary | ICD-10-CM | POA: Insufficient documentation

## 2016-07-03 DIAGNOSIS — Z760 Encounter for issue of repeat prescription: Secondary | ICD-10-CM | POA: Diagnosis present

## 2016-07-03 MED ORDER — LORAZEPAM 0.5 MG PO TABS
0.5000 mg | ORAL_TABLET | Freq: Once | ORAL | Status: AC
  Administered 2016-07-03: 0.5 mg via ORAL
  Filled 2016-07-03: qty 1

## 2016-07-03 NOTE — ED Provider Notes (Signed)
[facility] DEPT Provider Note   CSN: [id number] Arrival date & time: 07/03/16  0049  By signing my name below, I, David Lucero. David Lucero, attest that this documentation has been prepared under the direction and in the presence of David Deerman, MD.  Electronically Signed: Maud Lucero. David Lucero, ED Scribe. 07/03/16. 2:45 AM.    History   Chief Complaint Chief Complaint  Patient presents with  . Medication Refill   The history is provided by the patient. No language interpreter was used.  Medication Refill  Medications/supplies requested:  Ativan Reason for request:  Medications ran out Medications taken before: yes - see home medications   Patient has complete original prescription information: no     HPI Comments: David Lucero is a 70 y.o. male with a PMHx of COPD and ETOH abuse who presents to the Emergency Department here for a medication refill this evening. Pt states "i lost my Ativan". He also admits to "drinking too much". He states he has been more anxious in the last few days as he does not have his medication. No recent fever, chills, nausea, or vomiting.  No CP no SOB no n/v/d.    Per [facility], pt filled a 30 day prescription of Ativan on 06/13/2016.  PCP: David Noon, MD    Past Medical History:  Diagnosis Date  . Acid reflux    from throat cancer  . Anxiety   . Cancer ([place])   . COPD (chronic obstructive pulmonary disease) ([place])   . ETOH abuse   . H/O ETOH abuse   . Lung cancer ([place])   . PAD (peripheral artery disease) ([place])   . PTSD (post-traumatic stress disorder)     Patient Active Problem List   Diagnosis Date Noted  . PAD (peripheral artery disease) ([place]) 02/14/2014  . Chest pain 11/03/2013  . UNSPECIFIED HYPOTHYROIDISM 09/24/2008  . MORBID OBESITY 09/24/2008  . Alcohol abuse 09/24/2008  . PTSD 09/24/2008  . COPD 09/24/2008    History reviewed. No pertinent surgical history.     Home Medications    Prior to Admission medications     Medication Sig Start Date End Date Taking? Authorizing Provider  acetaminophen (TYLENOL) 500 MG tablet Take 1,000 mg by mouth daily. For pain    Historical Provider, MD  chlordiazePOXIDE (LIBRIUM) 25 MG capsule '50mg'$  PO TID x 1D, then 25-'50mg'$  PO BID X 1D, then 25-'50mg'$  PO QD X 1D Patient not taking: Reported on 06/09/2016 04/09/16   [facility] Ward, PA-C  hydrOXYzine (ATARAX/VISTARIL) 25 MG tablet Take 1 tablet (25 mg total) by mouth every 6 (six) hours. Patient not taking: Reported on 02/18/2016 06/30/15   David Greek, MD  LORazepam (ATIVAN) 1 MG tablet Take 0.5 tablets (0.5 mg total) by mouth daily as needed for anxiety. 06/09/16   David Dakin, MD  sertraline (ZOLOFT) 50 MG tablet Take 50 mg by mouth daily. Reported on 04/04/2016 03/27/16 03/27/17  Historical Provider, MD  traZODone (DESYREL) 100 MG tablet Take 100 mg by mouth at bedtime as needed for sleep.     Historical Provider, MD    Family History Family History  Problem Relation Age of Onset  . Heart attack Father     Social History Social History  Substance Use Topics  . Smoking status: Current Every Day Smoker    Packs/day: 1.50    Years: 30.00    Types: Cigarettes  . Smokeless tobacco: Never Used  . Alcohol use 50.4 oz/week    84 Cans of  beer per week     Comment: 12 or more beers/day     Allergies   Review of patient's allergies indicates no known allergies.   Review of Systems Review of Systems  Constitutional: Negative for chills and fever.  Gastrointestinal: Negative for nausea and vomiting.  All other systems reviewed and are negative.    Physical Exam Updated Vital Signs BP 131/75 (BP Location: Left Arm)   Pulse (!) 55   Temp 97.5 F (36.4 C) (Axillary)   Resp 16   SpO2 98%   Physical Exam  Constitutional: He is oriented to person, place, and time. He appears well-developed and well-nourished.  HENT:  Head: Normocephalic and atraumatic.  Mouth/Throat: Oropharynx is clear and moist.   Eyes: EOM are normal. Pupils are equal, round, and reactive to light.  Neck: Normal range of motion.  Trachea is midline. No carotid bruits.  Cardiovascular: Normal rate, regular rhythm, normal heart sounds and intact distal pulses.  Exam reveals no gallop and no friction rub.   No murmur heard. Pulmonary/Chest: Effort normal and breath sounds normal. No stridor. No respiratory distress. He has no wheezes. He exhibits no tenderness.  Abdominal: Soft. Bowel sounds are normal. He exhibits no distension. There is no tenderness.  Musculoskeletal: Normal range of motion.  Neurological: He is alert and oriented to person, place, and time.  Skin: Skin is warm and dry. Capillary refill takes less than 2 seconds.  Psychiatric: He has a normal mood and affect. Judgment normal.  Nursing note and vitals reviewed.    ED Treatments / Results   DIAGNOSTIC STUDIES: Oxygen Saturation is 98% on RA, Normal by my interpretation.    COORDINATION OF CARE: 2:42 AM- Will give 1 dose of Ativan. Discussed treatment plan with pt at bedside and pt agreed to plan.     Labs (all labs ordered are listed, but only abnormal results are displayed) Labs Reviewed - No data to display  Radiology No results found.  Procedures Procedures (including critical care time)  Medications Ordered in ED Medications - No data to display   Initial Impression / Assessment and Plan / ED Course  I have reviewed the triage vital signs and the nursing notes.  Pertinent labs & imaging results that were available during my care of the patient were reviewed by me and considered in my medical decision making (see chart for details).  Clinical Course      Final Clinical Impressions(s) / ED Diagnoses   Final diagnoses:  None    New Prescriptions New Prescriptions   No medications on file  Will not refill RX as received RX on 06/13/2016.  Suspect overuse.  All questions answered to patient's satisfaction. Based on history  and exam patient has been appropriately medically screened and emergency conditions excluded. Patient is stable for discharge at this time. Follow up with your PMD for recheck in 2 days and strict return precautions given   I personally performed the services described in this documentation, which was scribed in my presence. The recorded information has been reviewed and is accurate.      Joyclyn Plazola, MD 07/03/16 0300

## 2016-07-03 NOTE — ED Notes (Signed)
Patient was alert, oriented and stable upon discharge. RN went over AVS and patient had no further questions.  

## 2016-07-03 NOTE — ED Triage Notes (Signed)
Pt presents via EMS after being out of his PTSD med, ativan, x 1 week. Pt has been drinking tonight. Ambulatory, Alert and oriented.

## 2016-07-03 NOTE — ED Notes (Signed)
Bed: [facility] Expected date:  Expected time:  Means of arrival:  Comments: 

## 2016-07-06 ENCOUNTER — Encounter (HOSPITAL_COMMUNITY): Payer: Self-pay | Admitting: Emergency Medicine

## 2016-07-06 ENCOUNTER — Emergency Department (HOSPITAL_COMMUNITY)
Admission: EM | Admit: 2016-07-06 | Discharge: 2016-07-06 | Disposition: A | Discharge status: Home / Self Care | Payer: Medicare Other | Attending: Emergency Medicine | Admitting: Emergency Medicine

## 2016-07-06 DIAGNOSIS — Z85118 Personal history of other malignant neoplasm of bronchus and lung: Secondary | ICD-10-CM | POA: Diagnosis not present

## 2016-07-06 DIAGNOSIS — Z79899 Other long term (current) drug therapy: Secondary | ICD-10-CM | POA: Diagnosis not present

## 2016-07-06 DIAGNOSIS — J449 Chronic obstructive pulmonary disease, unspecified: Secondary | ICD-10-CM | POA: Insufficient documentation

## 2016-07-06 DIAGNOSIS — F17210 Nicotine dependence, cigarettes, uncomplicated: Secondary | ICD-10-CM | POA: Diagnosis not present

## 2016-07-06 DIAGNOSIS — L299 Pruritus, unspecified: Secondary | ICD-10-CM | POA: Insufficient documentation

## 2016-07-06 DIAGNOSIS — F10229 Alcohol dependence with intoxication, unspecified: Secondary | ICD-10-CM | POA: Diagnosis present

## 2016-07-06 NOTE — ED Notes (Signed)
Bed: [facility] Expected date:  Expected time:  Means of arrival:  Comments: EMS- 70 yo ETOH

## 2016-07-06 NOTE — ED Triage Notes (Signed)
Per EMS pt was called out to pts house. Pt c/o bugs crawling on him, PCP has not been able to find any on him.

## 2016-07-06 NOTE — ED Provider Notes (Signed)
[facility] DEPT Provider Note   CSN: [id number] Arrival date & time: 07/06/16  1554  By signing my name below, I, David Lucero, attest that this documentation has been prepared under the direction and in the presence of David Manifold, MD. Electronically Signed: Reola Lucero, ED Scribe. 07/06/16. 5:15 PM.  History   Chief Complaint Chief Complaint  Patient presents with  . Alcohol Intoxication  . Hallucinations    Tactile   The history is provided by the patient, the EMS personnel and medical records. No language interpreter was used.   HPI Comments: David Lucero is a 70 y.o. male BIB EMS, with a PMHx of EtOH abuse, who presents to the Emergency Department complaining of gradually worsening, intermittent tactile hallucinations onset PTA. He states that these hallucinations feel like smalls hairs, and that they are pruritic; however he denies seeing anything on his skin. He has been seen in his PCPs office for this problem in the past, with no specific dx. At that time he was rx'd Hydroxyzine creme. Pt has been applying Hydroxyzine to his skin with minimal relief of his pruritis. Denies fever, chills, or any other associated symptoms.   Pt additionally reports that he is currenly out of his Ativan and requesting a refill. He states that he "seems to be misplacing all of his pills". Pt additionally admits that he still abuses alcohol daily, and last drank prior to coming into the ED.   Past Medical History:  Diagnosis Date  . Acid reflux    from throat cancer  . Anxiety   . Cancer ([place])   . COPD (chronic obstructive pulmonary disease) ([place])   . ETOH abuse   . H/O ETOH abuse   . Lung cancer ([place])   . PAD (peripheral artery disease) ([place])   . PTSD (post-traumatic stress disorder)    Patient Active Problem List   Diagnosis Date Noted  . PAD (peripheral artery disease) ([place]) 02/14/2014  . Chest pain 11/03/2013  . UNSPECIFIED HYPOTHYROIDISM 09/24/2008  .  MORBID OBESITY 09/24/2008  . Alcohol abuse 09/24/2008  . PTSD 09/24/2008  . COPD 09/24/2008   History reviewed. No pertinent surgical history.  Home Medications    Prior to Admission medications   Medication Sig Start Date End Date Taking? Authorizing Provider  acetaminophen (TYLENOL) 500 MG tablet Take 1,000 mg by mouth daily. For pain    Historical Provider, MD  chlordiazePOXIDE (LIBRIUM) 25 MG capsule '50mg'$  PO TID x 1D, then 25-'50mg'$  PO BID X 1D, then 25-'50mg'$  PO QD X 1D Patient not taking: Reported on 06/09/2016 04/09/16   [facility] Ward, PA-C  hydrOXYzine (ATARAX/VISTARIL) 25 MG tablet Take 1 tablet (25 mg total) by mouth every 6 (six) hours. Patient not taking: Reported on 02/18/2016 06/30/15   David Greek, MD  LORazepam (ATIVAN) 1 MG tablet Take 0.5 tablets (0.5 mg total) by mouth daily as needed for anxiety. 06/09/16   [name], MD  sertraline (ZOLOFT) 50 MG tablet Take 50 mg by mouth daily. Reported on 04/04/2016 03/27/16 03/27/17  Historical Provider, MD  traZODone (DESYREL) 100 MG tablet Take 100 mg by mouth at bedtime as needed for sleep.     Historical Provider, MD   Family History Family History  Problem Relation Age of Onset  . Heart attack Father    Social History Social History  Substance Use Topics  . Smoking status: Current Every Day Smoker    Packs/day: 1.50    Years: 30.00    Types:  Cigarettes  . Smokeless tobacco: Never Used  . Alcohol use 50.4 oz/week    84 Cans of beer per week     Comment: 12 or more beers/day   Allergies   Review of patient's allergies indicates no known allergies.  Review of Systems Review of Systems  Constitutional: Negative for chills and fever.  Psychiatric/Behavioral: Positive for hallucinations (tactile).  All other systems reviewed and are negative.  Physical Exam Updated Vital Signs BP 134/81   Pulse 76   Temp 97.7 F (36.5 C)   Resp 18   SpO2 98%   Physical Exam  Constitutional: He appears  well-developed and well-nourished. No distress.  HENT:  Head: Normocephalic and atraumatic.  Mouth/Throat: Oropharynx is clear and moist. No oropharyngeal exudate.  Thin, has a disheveled appearance. NAD.   Eyes: Conjunctivae and EOM are normal. Pupils are equal, round, and reactive to light. Right eye exhibits no discharge. Left eye exhibits no discharge. No scleral icterus.  Neck: Normal range of motion. Neck supple. No JVD present. No thyromegaly present.  Cardiovascular: Normal rate, regular rhythm, normal heart sounds and intact distal pulses.  Exam reveals no gallop and no friction rub.   No murmur heard. Pulmonary/Chest: Effort normal and breath sounds normal. No respiratory distress. He has no wheezes. He has no rales.  Abdominal: Soft. Bowel sounds are normal. He exhibits no distension and no mass. There is no tenderness.  Musculoskeletal: Normal range of motion. He exhibits no edema or tenderness.  Lymphadenopathy:    He has no cervical adenopathy.  Neurological: He is alert. Coordination normal.  Skin: Skin is warm and dry. No rash noted. No erythema.  Psychiatric: He has a normal mood and affect. His behavior is normal.  Nursing note and vitals reviewed.  ED Treatments / Results  DIAGNOSTIC STUDIES: Oxygen Saturation is 98% on RA, normal by my interpretation.   COORDINATION OF CARE: 5:11 PM-Discussed next steps with pt. Pt verbalized understanding and is agreeable with the plan.   Labs (all labs ordered are listed, but only abnormal results are displayed) Labs Reviewed - No data to display  EKG  EKG Interpretation None      Radiology No results found.  Procedures Procedures (including critical care time)  Medications Ordered in ED Medications - No data to display  Initial Impression / Assessment and Plan / ED Course  I have reviewed the triage vital signs and the nursing notes.  Pertinent labs & imaging results that were available during my care of the  patient were reviewed by me and considered in my medical decision making (see chart for details).  Clinical Course    70yM with pruritis. He is requesting benzos. He abuses ETOH and possibly benzos as well. He has previously been given scripts for benzodiazepines from ED.  They wont be provided today. He has hydroxyzine. He is not in florid withdrawal. Advised outpt FU.   Final Clinical Impressions(s) / ED Diagnoses   Final diagnoses:  Pruritus    New Prescriptions New Prescriptions   No medications on file   I personally preformed the services scribed in my presence. The recorded information has been reviewed is accurate. David Manifold, MD.     David Manifold, MD 07/06/16 [phone]

## 2016-07-08 ENCOUNTER — Encounter (HOSPITAL_COMMUNITY): Payer: Self-pay | Admitting: Emergency Medicine

## 2016-07-08 ENCOUNTER — Emergency Department (HOSPITAL_COMMUNITY)
Admission: EM | Admit: 2016-07-08 | Discharge: 2016-07-08 | Disposition: A | Discharge status: Home / Self Care | Payer: Medicare Other | Attending: Emergency Medicine | Admitting: Emergency Medicine

## 2016-07-08 DIAGNOSIS — Z85118 Personal history of other malignant neoplasm of bronchus and lung: Secondary | ICD-10-CM | POA: Insufficient documentation

## 2016-07-08 DIAGNOSIS — J449 Chronic obstructive pulmonary disease, unspecified: Secondary | ICD-10-CM | POA: Insufficient documentation

## 2016-07-08 DIAGNOSIS — F17210 Nicotine dependence, cigarettes, uncomplicated: Secondary | ICD-10-CM | POA: Insufficient documentation

## 2016-07-08 DIAGNOSIS — Z79899 Other long term (current) drug therapy: Secondary | ICD-10-CM | POA: Diagnosis not present

## 2016-07-08 DIAGNOSIS — F1010 Alcohol abuse, uncomplicated: Secondary | ICD-10-CM

## 2016-07-08 NOTE — Discharge Instructions (Signed)
You need to follow up with Dr Melford Aase, he can help you get into detox or you can go to [facility] for mental health evaluation.

## 2016-07-08 NOTE — ED Provider Notes (Signed)
[facility] DEPT Provider Note   CSN: [id number] Arrival date & time: 07/08/16  0132 By signing my name below, I, Dyke Brackett, attest that this documentation has been prepared under the direction and in the presence of No att. providers found . Electronically Signed: Dyke Brackett, Scribe. 07/08/2016. 2:31 AM.   Time seen 02:27 AM  History   Chief Complaint Chief Complaint  Patient presents with  . Alcohol Problem   HPI David Lucero is a 70 y.o. male who presents to the Emergency Department complaining of alcohol problem. He was here for the same two days ago. Pt was last at detox one year ago and was sober for a few months. He states he is now drinking ten 12 oz beers a day. Last drink one hour ago. He lives with his brother. He was in the [employer] for 18 years, but does not currently work. He is followed by Dr. Melford Aase. He has run out of his ativan over a week ago and has not seen his doctor for a refill. He is a smoker (1 pack/day). Pt denies any depression, suicidal or homicidal ideation. He states "I need detox".  He states "I feel things crawling all the time".   The history is provided by the patient. No language interpreter was used.    Past Medical History:  Diagnosis Date  . Acid reflux    from throat cancer  . Anxiety   . Cancer ([place])   . COPD (chronic obstructive pulmonary disease) ([place])   . ETOH abuse   . H/O ETOH abuse   . Lung cancer ([place])   . PAD (peripheral artery disease) ([place])   . PTSD (post-traumatic stress disorder)    Patient Active Problem List   Diagnosis Date Noted  . PAD (peripheral artery disease) ([place]) 02/14/2014  . Chest pain 11/03/2013  . UNSPECIFIED HYPOTHYROIDISM 09/24/2008  . MORBID OBESITY 09/24/2008  . Alcohol abuse 09/24/2008  . PTSD 09/24/2008  . COPD 09/24/2008   History reviewed. No pertinent surgical history.   Home Medications    Prior to Admission medications   Medication Sig Start Date End Date Taking? Authorizing  Provider  acetaminophen (TYLENOL) 500 MG tablet Take 1,000 mg by mouth daily. For pain   Yes Historical Provider, MD  LORazepam (ATIVAN) 1 MG tablet Take 0.5 tablets (0.5 mg total) by mouth daily as needed for anxiety. 06/09/16  Yes [name], MD  traZODone (DESYREL) 100 MG tablet Take 100 mg by mouth at bedtime as needed for sleep.    Yes Historical Provider, MD  chlordiazePOXIDE (LIBRIUM) 25 MG capsule '50mg'$  PO TID x 1D, then 25-'50mg'$  PO BID X 1D, then 25-'50mg'$  PO QD X 1D Patient not taking: Reported on 06/09/2016 04/09/16   [facility] Ward, PA-C  hydrOXYzine (ATARAX/VISTARIL) 25 MG tablet Take 1 tablet (25 mg total) by mouth every 6 (six) hours. Patient not taking: Reported on 02/18/2016 06/30/15   Orpah Greek, MD    Family History Family History  Problem Relation Age of Onset  . Heart attack Father     Social History Social History  Substance Use Topics  . Smoking status: Current Every Day Smoker    Packs/day: 1.50    Years: 30.00    Types: Cigarettes  . Smokeless tobacco: Never Used  . Alcohol use 50.4 oz/week    84 Cans of beer per week     Comment: 12 or more beers/day  retired [employer] treasurer  Allergies   Review of patient's allergies indicates  no known allergies.  Review of Systems Review of Systems 10 Systems reviewed and are negative for acute change except as noted in the HPI.   Physical Exam Updated Vital Signs BP 153/72 (BP Location: Right Arm)   Pulse 69   Temp 97.3 F (36.3 C) (Axillary)   Resp 16   SpO2 98%   Vital signs normal    Physical Exam  Constitutional: He is oriented to person, place, and time.  Non-toxic appearance. He does not appear ill. No distress.  Thin elderly male. Followed [place] out of the room with a steady gait.   HENT:  Head: Normocephalic and atraumatic.  Right Ear: External ear normal.  Left Ear: External ear normal.  Nose: Nose normal. No mucosal edema or rhinorrhea.  Mouth/Throat: Oropharynx is clear and moist and mucous  membranes are normal. No dental abscesses or uvula swelling.  Eyes: Conjunctivae and EOM are normal. Pupils are equal, round, and reactive to light.  Neck: Normal range of motion and full passive range of motion without pain. Neck supple.  Cardiovascular: Normal rate, regular rhythm and normal heart sounds.  Exam reveals no gallop and no friction rub.   No murmur heard. Pulmonary/Chest: Effort normal and breath sounds normal. No respiratory distress. He has no wheezes. He has no rhonchi. He has no rales. He exhibits no tenderness and no crepitus.  Abdominal: Soft. Normal appearance and bowel sounds are normal. He exhibits no distension. There is no tenderness. There is no rebound and no guarding.  Musculoskeletal: Normal range of motion. He exhibits no edema or tenderness.  Neurological: He is alert and oriented to person, place, and time. He has normal strength. No cranial nerve deficit.  Skin: Skin is warm, dry and intact. No rash noted. No erythema. No pallor.  Psychiatric: He has a normal mood and affect. His speech is normal and behavior is normal. His mood appears not anxious.  Nursing note and vitals reviewed.   ED Treatments / Results  Labs (all labs ordered are listed, but only abnormal results are displayed) Results for orders placed or performed during the hospital encounter of 06/09/16  Ethanol  Result Value Ref Range   Alcohol, Ethyl (B) <5 <5 mg/dL  Comprehensive metabolic panel  Result Value Ref Range   Sodium 137 135 - 145 mmol/L   Potassium 4.0 3.5 - 5.1 mmol/L   Chloride 105 101 - 111 mmol/L   CO2 26 22 - 32 mmol/L   Glucose, Bld 85 65 - 99 mg/dL   BUN 13 6 - 20 mg/dL   Creatinine, Ser 0.74 0.61 - 1.24 mg/dL   Calcium 9.1 8.9 - 10.3 mg/dL   Total Protein 7.3 6.5 - 8.1 g/dL   Albumin 4.0 3.5 - 5.0 g/dL   AST 15 15 - 41 U/L   ALT 11 (L) 17 - 63 U/L   Alkaline Phosphatase 52 38 - 126 U/L   Total Bilirubin 0.8 0.3 - 1.2 mg/dL   GFR calc non Af Amer >60 >60 mL/min    GFR calc Af Amer >60 >60 mL/min   Anion gap 6 5 - 15  CBC with Differential/Platelet  Result Value Ref Range   WBC 4.7 4.0 - 10.5 K/uL   RBC 4.88 4.22 - 5.81 MIL/uL   Hemoglobin 14.7 13.0 - 17.0 g/dL   HCT 45.9 39.0 - 52.0 %   MCV 94.1 78.0 - 100.0 fL   MCH 30.1 26.0 - 34.0 pg   MCHC 32.0 30.0 - 36.0 g/dL  RDW 13.9 11.5 - 15.5 %   Platelets 211 150 - 400 K/uL   Neutrophils Relative % 51 %   Neutro Abs 2.4 1.7 - 7.7 K/uL   Lymphocytes Relative 33 %   Lymphs Abs 1.6 0.7 - 4.0 K/uL   Monocytes Relative 8 %   Monocytes Absolute 0.4 0.1 - 1.0 K/uL   Eosinophils Relative 7 %   Eosinophils Absolute 0.3 0.0 - 0.7 K/uL   Basophils Relative 1 %   Basophils Absolute 0.1 0.0 - 0.1 K/uL      EKG  EKG Interpretation None       Procedures Procedures (including critical care time)    Initial Impression / Assessment and Plan / ED Course  I have reviewed the triage vital signs and the nursing notes.  Pertinent labs & imaging results that were available during my care of the patient were reviewed by me and considered in my medical decision making (see chart for details).  Clinical Course  DIAGNOSTIC STUDIES:  Oxygen Saturation is 98% on RA, normal by my interpretation.    COORDINATION OF CARE:  2:31 AM Pt to speak to behavioral health. Discussed treatment plan with pt at bedside and pt agreed to plan. This is his 3rd ED visit this month for alcoholism related complaints.   Almost as soon as I walked out of the room patient states he wants to be discharged. He denies depression, SI or HI. He can arrange detox with his PCP.   Final Clinical Impressions(s) / ED Diagnoses   Final diagnoses:  Alcohol abuse   Plan discharge  Rolland Porter, MD, FACEP  I personally performed the services described in this documentation, which was scribed in my presence. The recorded information has been reviewed and considered.  Rolland Porter, MD, Barbette Or, MD 07/08/16 [phone]

## 2016-07-08 NOTE — ED Triage Notes (Signed)
Pt brought in by EMS with c/o alcohol problem  Pt states he last drank an hour ago  Pt states he usually drinks about 10 beers per day  Pt states he needs some lorazepam

## 2016-07-24 ENCOUNTER — Emergency Department (HOSPITAL_COMMUNITY)
Admission: EM | Admit: 2016-07-24 | Discharge: 2016-07-24 | Disposition: A | Discharge status: Home / Self Care | Payer: Medicare Other | Attending: Emergency Medicine | Admitting: Emergency Medicine

## 2016-07-24 ENCOUNTER — Encounter (HOSPITAL_COMMUNITY): Payer: Self-pay | Admitting: Emergency Medicine

## 2016-07-24 DIAGNOSIS — J449 Chronic obstructive pulmonary disease, unspecified: Secondary | ICD-10-CM | POA: Insufficient documentation

## 2016-07-24 DIAGNOSIS — F10120 Alcohol abuse with intoxication, uncomplicated: Secondary | ICD-10-CM | POA: Diagnosis not present

## 2016-07-24 DIAGNOSIS — Z85118 Personal history of other malignant neoplasm of bronchus and lung: Secondary | ICD-10-CM | POA: Diagnosis not present

## 2016-07-24 DIAGNOSIS — F10920 Alcohol use, unspecified with intoxication, uncomplicated: Secondary | ICD-10-CM

## 2016-07-24 DIAGNOSIS — F17210 Nicotine dependence, cigarettes, uncomplicated: Secondary | ICD-10-CM | POA: Insufficient documentation

## 2016-07-24 DIAGNOSIS — Z79899 Other long term (current) drug therapy: Secondary | ICD-10-CM | POA: Insufficient documentation

## 2016-07-24 LAB — CBC
HCT: 44.9 % (ref 39.0–52.0)
Hemoglobin: 14.5 g/dL (ref 13.0–17.0)
MCH: 30.3 pg (ref 26.0–34.0)
MCHC: 32.3 g/dL (ref 30.0–36.0)
MCV: 93.7 fL (ref 78.0–100.0)
PLATELETS: 223 10*3/uL (ref 150–400)
RBC: 4.79 MIL/uL (ref 4.22–5.81)
RDW: 15.2 % (ref 11.5–15.5)
WBC: 5.5 10*3/uL (ref 4.0–10.5)

## 2016-07-24 LAB — COMPREHENSIVE METABOLIC PANEL
ALBUMIN: 4.1 g/dL (ref 3.5–5.0)
ALK PHOS: 53 U/L (ref 38–126)
ALT: 10 U/L — AB (ref 17–63)
ANION GAP: 10 (ref 5–15)
AST: 17 U/L (ref 15–41)
BUN: 8 mg/dL (ref 6–20)
CALCIUM: 9.6 mg/dL (ref 8.9–10.3)
CO2: 26 mmol/L (ref 22–32)
CREATININE: 0.7 mg/dL (ref 0.61–1.24)
Chloride: 102 mmol/L (ref 101–111)
GFR calc Af Amer: >60 mL/min (ref >60)
GFR calc non Af Amer: >60 mL/min (ref >60)
GLUCOSE: 86 mg/dL (ref 65–99)
Potassium: 3.9 mmol/L (ref 3.5–5.1)
SODIUM: 138 mmol/L (ref 135–145)
Total Bilirubin: 0.6 mg/dL (ref 0.3–1.2)
Total Protein: 7.6 g/dL (ref 6.5–8.1)

## 2016-07-24 LAB — ETHANOL: Alcohol, Ethyl (B): <5 mg/dL (ref <5)

## 2016-07-24 NOTE — ED Notes (Signed)
Bed: [facility] Expected date:  Expected time:  Means of arrival:  Comments: 

## 2016-07-24 NOTE — ED Triage Notes (Signed)
Pt comes from home via EMS with interest in detox.  States he drinks about a 12 pack or more a day.  Last drink was a "couple" of hours ago. Ambulatory without assistance. Noncompliant with medications over the past week (trazadone and lorazepam). CBG 84.

## 2016-07-24 NOTE — ED Provider Notes (Signed)
[facility] DEPT Provider Note   CSN: [id number] Arrival date & time: 07/24/16  0603     History   Chief Complaint No chief complaint on file.   HPI David Lucero is a 70 y.o. male.  The history is provided by the patient.  Alcohol Intoxication  This is a recurrent problem. The current episode started 12 to 24 hours ago. The problem occurs constantly. The problem has not changed since onset.Pertinent negatives include no chest pain, no abdominal pain and no shortness of breath. Nothing aggravates the symptoms. Nothing relieves the symptoms. He has tried nothing for the symptoms.    Past Medical History:  Diagnosis Date  . Acid reflux    from throat cancer  . Anxiety   . Cancer ([place])   . COPD (chronic obstructive pulmonary disease) ([place])   . ETOH abuse   . H/O ETOH abuse   . Lung cancer ([place])   . PAD (peripheral artery disease) ([place])   . PTSD (post-traumatic stress disorder)     Patient Active Problem List   Diagnosis Date Noted  . PAD (peripheral artery disease) ([place]) 02/14/2014  . Chest pain 11/03/2013  . UNSPECIFIED HYPOTHYROIDISM 09/24/2008  . MORBID OBESITY 09/24/2008  . Alcohol abuse 09/24/2008  . PTSD 09/24/2008  . COPD 09/24/2008    History reviewed. No pertinent surgical history.     Home Medications    Prior to Admission medications   Medication Sig Start Date End Date Taking? Authorizing Provider  acetaminophen (TYLENOL) 500 MG tablet Take 1,000 mg by mouth daily. For pain    Historical Provider, MD  chlordiazePOXIDE (LIBRIUM) 25 MG capsule '50mg'$  PO TID x 1D, then 25-'50mg'$  PO BID X 1D, then 25-'50mg'$  PO QD X 1D Patient not taking: Reported on 06/09/2016 04/09/16   [facility] Ward, PA-C  hydrOXYzine (ATARAX/VISTARIL) 25 MG tablet Take 1 tablet (25 mg total) by mouth every 6 (six) hours. Patient not taking: Reported on 02/18/2016 06/30/15   Orpah Greek, MD  LORazepam (ATIVAN) 1 MG tablet Take 0.5 tablets (0.5 mg total) by mouth daily as  needed for anxiety. 06/09/16   [name], MD  traZODone (DESYREL) 100 MG tablet Take 100 mg by mouth at bedtime as needed for sleep.     Historical Provider, MD    Family History Family History  Problem Relation Age of Onset  . Heart attack Father     Social History Social History  Substance Use Topics  . Smoking status: Current Every Day Smoker    Packs/day: 1.50    Years: 30.00    Types: Cigarettes  . Smokeless tobacco: Never Used  . Alcohol use 50.4 oz/week    84 Cans of beer per week     Comment: 12 or more beers/day     Allergies   Review of patient's allergies indicates no known allergies.   Review of Systems Review of Systems  Respiratory: Negative for shortness of breath.   Cardiovascular: Negative for chest pain.  Gastrointestinal: Negative for abdominal pain.  All other systems reviewed and are negative.    Physical Exam Updated Vital Signs There were no vitals taken for this visit.  Physical Exam  Constitutional: He is oriented to person, place, and time. He appears well-developed. No distress.  Disheveled male  HENT:  Head: Normocephalic and atraumatic.  Nose: Nose normal.  Eyes: Conjunctivae are normal.  Neck: Neck supple. No tracheal deviation present.  Cardiovascular: Normal rate and regular rhythm.   Pulmonary/Chest: Effort normal. No  respiratory distress.  Abdominal: Soft. He exhibits no distension.  Neurological: He is alert and oriented to person, place, and time. Coordination and gait normal. GCS eye subscore is 4. GCS verbal subscore is 5. GCS motor subscore is 6.  Skin: Skin is warm and dry.  Psychiatric: He has a normal mood and affect.     ED Treatments / Results  Labs (all labs ordered are listed, but only abnormal results are displayed) Labs Reviewed  COMPREHENSIVE METABOLIC PANEL - Abnormal; Notable for the following:       Result Value   ALT 10 (*)    All other components within normal limits  ETHANOL  CBC    EKG   EKG Interpretation None       Radiology No results found.  Procedures Procedures (including critical care time)  Medications Ordered in ED Medications - No data to display   Initial Impression / Assessment and Plan / ED Course  I have reviewed the triage vital signs and the nursing notes.  Pertinent labs & imaging results that were available during my care of the patient were reviewed by me and considered in my medical decision making (see chart for details).  Clinical Course    70 y.o. male presents with drinking yesterday and feeling shaky today. Appears disheveled but is ambulatory, eating during exam and is well appearing. No signs of impending alcohol withdrawal or DTs. I explained we do not have inpatient detox here and Pt wishes to be discharged which I feel is appropriate given his good clinical appearance. Discussed safe cessation of alcohol.   Final Clinical Impressions(s) / ED Diagnoses   Final diagnoses:  Alcohol intoxication, uncomplicated ([place])    New Prescriptions New Prescriptions   No medications on file     Leo Grosser, MD 07/24/16 1726

## 2016-07-24 NOTE — ED Notes (Signed)
Sandwich tray, peanut butter and crackers, and a drink provided at patient request. Patient also given pillow and blanket for patient comfort. No other needs voiced at this time. NAD noted.

## 2016-09-03 ENCOUNTER — Emergency Department (HOSPITAL_COMMUNITY)
Admission: EM | Admit: 2016-09-03 | Discharge: 2016-09-03 | Disposition: A | Discharge status: Home / Self Care | Payer: Medicare Other | Attending: Emergency Medicine | Admitting: Emergency Medicine

## 2016-09-03 ENCOUNTER — Encounter (HOSPITAL_COMMUNITY): Payer: Self-pay | Admitting: Nurse Practitioner

## 2016-09-03 DIAGNOSIS — F411 Generalized anxiety disorder: Secondary | ICD-10-CM

## 2016-09-03 DIAGNOSIS — R52 Pain, unspecified: Secondary | ICD-10-CM | POA: Diagnosis present

## 2016-09-03 DIAGNOSIS — Z85118 Personal history of other malignant neoplasm of bronchus and lung: Secondary | ICD-10-CM | POA: Insufficient documentation

## 2016-09-03 DIAGNOSIS — F419 Anxiety disorder, unspecified: Secondary | ICD-10-CM | POA: Diagnosis not present

## 2016-09-03 DIAGNOSIS — J449 Chronic obstructive pulmonary disease, unspecified: Secondary | ICD-10-CM | POA: Insufficient documentation

## 2016-09-03 DIAGNOSIS — Z79899 Other long term (current) drug therapy: Secondary | ICD-10-CM | POA: Insufficient documentation

## 2016-09-03 DIAGNOSIS — F17210 Nicotine dependence, cigarettes, uncomplicated: Secondary | ICD-10-CM | POA: Diagnosis not present

## 2016-09-03 MED ORDER — LORAZEPAM 0.5 MG PO TABS
0.5000 mg | ORAL_TABLET | Freq: Once | ORAL | Status: AC
  Administered 2016-09-03: 0.5 mg via ORAL
  Filled 2016-09-03: qty 1

## 2016-09-03 NOTE — ED Triage Notes (Signed)
Pt presents familiarly from home stating he is out of his Trazadone and Lorazepam, also c/o body aches.

## 2016-09-03 NOTE — ED Provider Notes (Signed)
Patient offers no complaint. He states he feels as if "things are crawling on him". He admits to drinking one alcoholic beverage earlier today. He is alert Glasgow Coma Score 15 does not appear intoxicated ambulates without difficulty.   [name], MD 09/03/16 2128

## 2016-09-03 NOTE — ED Provider Notes (Signed)
[facility] DEPT Provider Note   CSN: [id number] Arrival date & time: 09/03/16  1800  By signing my name below, I, Camillo Flaming, attest that this documentation has been prepared under the direction and in the presence of Shamir Tuzzolino A Yzabelle Calles PA-C,. Electronically Signed: Camillo Flaming, Scribe. 09/03/16. 8:38 PM.   History   Chief Complaint Chief Complaint  Patient presents with  . Generalized Body Aches    The history is provided by the patient. No language interpreter was used.   HPI Comments: David Lucero is a 70 y.o. male who presents to the Emergency Department complaining of persistent generalized body discomfort described as "feeling hair all over his body" for the past couple weeks. He reports taking Trazodone before sleeping and Lorazepam once a day as prescribed by PCP. He has been out of Lorazepam for past 2 weeks and reports symptoms started at that time. He denies fever, change in appetite, diarrhea, vomiting, abdominal pain.   PCP [facility]  Past Medical History:  Diagnosis Date  . Acid reflux    from throat cancer  . Anxiety   . Cancer ([place])   . COPD (chronic obstructive pulmonary disease) ([place])   . ETOH abuse   . H/O ETOH abuse   . Lung cancer ([place])   . PAD (peripheral artery disease) ([place])   . PTSD (post-traumatic stress disorder)     Patient Active Problem List   Diagnosis Date Noted  . PAD (peripheral artery disease) ([place]) 02/14/2014  . Chest pain 11/03/2013  . UNSPECIFIED HYPOTHYROIDISM 09/24/2008  . MORBID OBESITY 09/24/2008  . Alcohol abuse 09/24/2008  . PTSD 09/24/2008  . COPD 09/24/2008    History reviewed. No pertinent surgical history.     Home Medications    Prior to Admission medications   Medication Sig Start Date End Date Taking? Authorizing Provider  acetaminophen (TYLENOL) 500 MG tablet Take 1,000 mg by mouth daily. For pain    Historical Provider, MD  chlordiazePOXIDE (LIBRIUM) 25 MG capsule '50mg'$  PO TID x 1D, then 25-'50mg'$  PO BID  X 1D, then 25-'50mg'$  PO QD X 1D Patient not taking: Reported on 06/09/2016 04/09/16   [facility] Ward, PA-C  hydrOXYzine (ATARAX/VISTARIL) 25 MG tablet Take 1 tablet (25 mg total) by mouth every 6 (six) hours. Patient not taking: Reported on 02/18/2016 06/30/15   Orpah Greek, MD  LORazepam (ATIVAN) 1 MG tablet Take 0.5 tablets (0.5 mg total) by mouth daily as needed for anxiety. 06/09/16   [name], MD  traZODone (DESYREL) 100 MG tablet Take 100 mg by mouth at bedtime as needed for sleep.     Historical Provider, MD    Family History Family History  Problem Relation Age of Onset  . Heart attack Father     Social History Social History  Substance Use Topics  . Smoking status: Current Every Day Smoker    Packs/day: 1.50    Years: 30.00    Types: Cigarettes  . Smokeless tobacco: Never Used  . Alcohol use 50.4 oz/week    84 Cans of beer per week     Comment: 12 or more beers/day     Allergies   Patient has no known allergies.   Review of Systems Review of Systems  Constitutional: Negative for appetite change and fever.  Gastrointestinal: Negative for abdominal pain, diarrhea and vomiting.  Musculoskeletal: Positive for myalgias.  Psychiatric/Behavioral: The patient is nervous/anxious.   All other systems reviewed and are negative.    Physical Exam Updated Vital  Signs BP 126/77   Pulse 78   Temp 97.4 F (36.3 C) (Oral)   Resp 15   SpO2 100%   Physical Exam  Constitutional: He is oriented to person, place, and time. He appears well-developed and well-nourished. No distress.  HENT:  Head: Normocephalic and atraumatic.  Eyes: Conjunctivae are normal.  Cardiovascular: Normal rate, regular rhythm and normal heart sounds.   Pulmonary/Chest: Effort normal and breath sounds normal.  Lungs CTAB  Abdominal: Soft. He exhibits no distension. There is no tenderness.  Neurological: He is alert and oriented to person, place, and time.  Skin: Skin is warm and dry.    Psychiatric: He has a normal mood and affect.  Nursing note and vitals reviewed.    ED Treatments / Results  DIAGNOSTIC STUDIES: Oxygen Saturation is 100% on RA, normal by my interpretation.    COORDINATION OF CARE: 8:35 PM Discussed treatment plan with pt at bedside and pt agreed to plan.   Labs (all labs ordered are listed, but only abnormal results are displayed) Labs Reviewed - No data to display  EKG  EKG Interpretation None       Radiology No results found.  Procedures Procedures (including critical care time)  Medications Ordered in ED Medications - No data to display   Initial Impression / Assessment and Plan / ED Course  I have reviewed the triage vital signs and the nursing notes.  Pertinent labs & imaging results that were available during my care of the patient were reviewed by me and considered in my medical decision making (see chart for details).  Clinical Course     Well appearing patient with normal VS. Vague complaint of generalized body aches x 2 weeks. He reports being out of his lorazepam and trazadone since symptoms began. No other complaints.  Chart reviewed. He is on 0.5 mg Ativan daily and 100 mg Trazodone at night. Discussed that we would not fill chronic medications in the ED. Will give 0.5 mg Ativan tonight but no prescriptions on discharge. He is encouraged to see his doctor for Rx's.   Final Clinical Impressions(s) / ED Diagnoses   Final diagnoses:  None   1. Generalized anxiety  New Prescriptions New Prescriptions   No medications on file   I personally performed the services described in this documentation, which was scribed in my presence. The recorded information has been reviewed and is accurate.      Charlann Lange, PA-C 09/03/16 2159    [name], MD 09/04/16 [phone]

## 2016-09-06 DIAGNOSIS — F17210 Nicotine dependence, cigarettes, uncomplicated: Secondary | ICD-10-CM | POA: Insufficient documentation

## 2016-09-06 DIAGNOSIS — E039 Hypothyroidism, unspecified: Secondary | ICD-10-CM | POA: Insufficient documentation

## 2016-09-06 DIAGNOSIS — F419 Anxiety disorder, unspecified: Secondary | ICD-10-CM | POA: Insufficient documentation

## 2016-09-06 DIAGNOSIS — G4700 Insomnia, unspecified: Secondary | ICD-10-CM | POA: Insufficient documentation

## 2016-09-06 DIAGNOSIS — Z85818 Personal history of malignant neoplasm of other sites of lip, oral cavity, and pharynx: Secondary | ICD-10-CM | POA: Insufficient documentation

## 2016-09-06 DIAGNOSIS — Z85118 Personal history of other malignant neoplasm of bronchus and lung: Secondary | ICD-10-CM | POA: Diagnosis not present

## 2016-09-06 DIAGNOSIS — J449 Chronic obstructive pulmonary disease, unspecified: Secondary | ICD-10-CM | POA: Diagnosis not present

## 2016-09-07 ENCOUNTER — Emergency Department (HOSPITAL_COMMUNITY)
Admission: EM | Admit: 2016-09-07 | Discharge: 2016-09-07 | Disposition: A | Discharge status: Home / Self Care | Payer: Medicare Other | Attending: Emergency Medicine | Admitting: Emergency Medicine

## 2016-09-07 ENCOUNTER — Encounter (HOSPITAL_COMMUNITY): Payer: Self-pay

## 2016-09-07 DIAGNOSIS — G4700 Insomnia, unspecified: Secondary | ICD-10-CM

## 2016-09-07 DIAGNOSIS — F419 Anxiety disorder, unspecified: Secondary | ICD-10-CM

## 2016-09-07 MED ORDER — KETOROLAC TROMETHAMINE 60 MG/2ML IM SOLN
30.0000 mg | Freq: Once | INTRAMUSCULAR | Status: AC
  Administered 2016-09-07: 30 mg via INTRAMUSCULAR
  Filled 2016-09-07: qty 2

## 2016-09-07 MED ORDER — TRAZODONE HCL 100 MG PO TABS: 100.0000 mg | ORAL_TABLET | Freq: Every evening | ORAL | 0 refills | Status: DC | PRN

## 2016-09-07 MED ORDER — LORAZEPAM 1 MG PO TABS
1.0000 mg | ORAL_TABLET | Freq: Once | ORAL | Status: AC
  Administered 2016-09-07: 1 mg via ORAL
  Filled 2016-09-07: qty 1

## 2016-09-07 NOTE — ED Triage Notes (Addendum)
Pt states that he is out of his anxiety medication (Ativan and Trazadone) for the last 2 weeks. Pt denies SOB, or chest pain. Pt is ambulatory. A&Ox4. From home

## 2016-09-07 NOTE — ED Provider Notes (Signed)
[facility] DEPT Provider Note   CSN: [id number] Arrival date & time: 09/06/16  2353 By signing my name below, I, Dyke Brackett, attest that this documentation has been prepared under the direction and in the presence of Deno Etienne, DO . Electronically Signed: Dyke Brackett, Scribe. 09/07/2016. 12:50 AM.   History   Chief Complaint Chief Complaint  Patient presents with  . Anxiety   HPI David Lucero is a 70 y.o. male with hx of anxiety and ETOH abuse who presents to the Emergency Department complaining of increasing anxiety onset two weeks ago. Per pt, he takes Ativan and trazodone for anxiety but ran out two weeks ago. He has not seen his doctor to refill his prescription. He also complains of pain to his bilateral feet and generalized body aches, typical for him. Pt denies any SOB or chest pain.   The history is provided by the patient. No language interpreter was used.   Past Medical History:  Diagnosis Date  . Acid reflux    from throat cancer  . Anxiety   . Cancer ([place])   . COPD (chronic obstructive pulmonary disease) ([place])   . ETOH abuse   . H/O ETOH abuse   . Lung cancer ([place])   . PAD (peripheral artery disease) ([place])   . PTSD (post-traumatic stress disorder)     Patient Active Problem List   Diagnosis Date Noted  . PAD (peripheral artery disease) ([place]) 02/14/2014  . Chest pain 11/03/2013  . UNSPECIFIED HYPOTHYROIDISM 09/24/2008  . MORBID OBESITY 09/24/2008  . Alcohol abuse 09/24/2008  . PTSD 09/24/2008  . COPD 09/24/2008   History reviewed. No pertinent surgical history.   Home Medications    Prior to Admission medications   Medication Sig Start Date End Date Taking? Authorizing Provider  acetaminophen (TYLENOL) 500 MG tablet Take 1,000 mg by mouth daily. For pain    Historical Provider, MD  chlordiazePOXIDE (LIBRIUM) 25 MG capsule '50mg'$  PO TID x 1D, then 25-'50mg'$  PO BID X 1D, then 25-'50mg'$  PO QD X 1D Patient not taking: Reported on 06/09/2016 04/09/16    [facility] Ward, PA-C  hydrOXYzine (ATARAX/VISTARIL) 25 MG tablet Take 1 tablet (25 mg total) by mouth every 6 (six) hours. Patient not taking: Reported on 02/18/2016 06/30/15   Orpah Greek, MD  LORazepam (ATIVAN) 1 MG tablet Take 0.5 tablets (0.5 mg total) by mouth daily as needed for anxiety. 06/09/16   [name], MD  traZODone (DESYREL) 100 MG tablet Take 1 tablet (100 mg total) by mouth at bedtime as needed for sleep. 09/07/16   Deno Etienne, DO    Family History Family History  Problem Relation Age of Onset  . Heart attack Father     Social History Social History  Substance Use Topics  . Smoking status: Current Every Day Smoker    Packs/day: 1.50    Years: 30.00    Types: Cigarettes  . Smokeless tobacco: Never Used  . Alcohol use 50.4 oz/week    84 Cans of beer per week     Comment: 12 or more beers/day     Allergies   Patient has no known allergies.  Review of Systems Review of Systems  Constitutional: Negative for chills and fever.  HENT: Negative for congestion and facial swelling.   Eyes: Negative for discharge and visual disturbance.  Respiratory: Negative for shortness of breath.   Cardiovascular: Negative for chest pain and palpitations.  Gastrointestinal: Negative for abdominal pain, diarrhea and vomiting.  Musculoskeletal: Positive for  myalgias. Negative for arthralgias.  Skin: Negative for color change and rash.  Neurological: Negative for tremors, syncope and headaches.  Psychiatric/Behavioral: Negative for confusion and dysphoric mood. The patient is nervous/anxious.    Physical Exam Updated Vital Signs BP 170/87 (BP Location: Right Arm)   Pulse 84   Temp 98.5 F (36.9 C) (Oral)   Resp 18   SpO2 94%   Physical Exam  Constitutional: He is oriented to person, place, and time. He appears well-developed and well-nourished. He appears ill.  Chronically ill appearing  HENT:  Head: Normocephalic and atraumatic.  Eyes: EOM are normal.  Pupils are equal, round, and reactive to light.  Neck: Normal range of motion. Neck supple. No JVD present.  Cardiovascular: Normal rate and regular rhythm.  Exam reveals no gallop and no friction rub.   No murmur heard. Pulmonary/Chest: No respiratory distress. He has no wheezes.  Abdominal: He exhibits no distension. There is no rebound and no guarding.  Musculoskeletal: Normal range of motion.  Neurological: He is alert and oriented to person, place, and time.  Skin: Skin is dry. No rash noted. No pallor.  Dry feet bilaterally  Psychiatric: He has a normal mood and affect. His behavior is normal.  Nursing note and vitals reviewed.   ED Treatments / Results  DIAGNOSTIC STUDIES:  Oxygen Saturation is 100% on RA, normal by my interpretation.    COORDINATION OF CARE:  12:42 AM Will order Toradol injection and ativan. Discussed treatment plan with pt at bedside and pt agreed to plan.  Labs (all labs ordered are listed, but only abnormal results are displayed) Labs Reviewed - No data to display  EKG  EKG Interpretation None       Radiology No results found.  Procedures Procedures (including critical care time)  Medications Ordered in ED Medications  ketorolac (TORADOL) injection 30 mg (30 mg Intramuscular Given 09/07/16 0114)  LORazepam (ATIVAN) tablet 1 mg (1 mg Oral Given 09/07/16 0114)     Initial Impression / Assessment and Plan / ED Course  I have reviewed the triage vital signs and the nursing notes.  Pertinent labs & imaging results that were available during my care of the patient were reviewed by me and considered in my medical decision making (see chart for details).  Clinical Course     70 yo M With a chief complaint of anxiety and insomnia. Patient has been without his home medications for the past week or so. He is unable to see anybody until Monday. He also complaining of cracks to his feet from dry skin. He is well-appearing and nontoxic. We'll give  him a 2 day prescription for his sleeping medication. PCP follow-up.  2:41 AM:  I have discussed the diagnosis/risks/treatment options with the patient and family and believe the pt to be eligible for discharge home to follow-up with PCP. We also discussed returning to the ED immediately if new or worsening sx occur. We discussed the sx which are most concerning (e.g., sudden worsening pain, fever, inability to tolerate by mouth) that necessitate immediate return. Medications administered to the patient during their visit and any new prescriptions provided to the patient are listed below.  Medications given during this visit Medications  ketorolac (TORADOL) injection 30 mg (30 mg Intramuscular Given 09/07/16 0114)  LORazepam (ATIVAN) tablet 1 mg (1 mg Oral Given 09/07/16 0114)     The patient appears reasonably screen and/or stabilized for discharge and I doubt any other medical condition or other [facility] requiring  further screening, evaluation, or treatment in the ED at this time prior to discharge.    Final Clinical Impressions(s) / ED Diagnoses   Final diagnoses:  Anxiety  Insomnia, unspecified type    New Prescriptions Discharge Medication List as of 09/07/2016 12:45 AM     I personally performed the services described in this documentation, which was scribed in my presence. The recorded information has been reviewed and is accurate.     Deno Etienne, DO 09/07/16 [phone]

## 2016-12-03 ENCOUNTER — Encounter (HOSPITAL_COMMUNITY): Payer: Self-pay | Admitting: Emergency Medicine

## 2016-12-03 ENCOUNTER — Emergency Department (HOSPITAL_COMMUNITY)
Admission: EM | Admit: 2016-12-03 | Discharge: 2016-12-03 | Disposition: A | Discharge status: Home / Self Care | Payer: Medicare Other | Attending: Emergency Medicine | Admitting: Emergency Medicine

## 2016-12-03 DIAGNOSIS — Z85818 Personal history of malignant neoplasm of other sites of lip, oral cavity, and pharynx: Secondary | ICD-10-CM | POA: Insufficient documentation

## 2016-12-03 DIAGNOSIS — F1010 Alcohol abuse, uncomplicated: Secondary | ICD-10-CM

## 2016-12-03 DIAGNOSIS — Z85118 Personal history of other malignant neoplasm of bronchus and lung: Secondary | ICD-10-CM | POA: Diagnosis not present

## 2016-12-03 DIAGNOSIS — F419 Anxiety disorder, unspecified: Secondary | ICD-10-CM | POA: Diagnosis not present

## 2016-12-03 DIAGNOSIS — J449 Chronic obstructive pulmonary disease, unspecified: Secondary | ICD-10-CM | POA: Insufficient documentation

## 2016-12-03 DIAGNOSIS — F17210 Nicotine dependence, cigarettes, uncomplicated: Secondary | ICD-10-CM | POA: Insufficient documentation

## 2016-12-03 MED ORDER — LORAZEPAM 1 MG PO TABS
1.0000 mg | ORAL_TABLET | Freq: Once | ORAL | Status: AC
  Administered 2016-12-03: 1 mg via ORAL
  Filled 2016-12-03: qty 1

## 2016-12-03 NOTE — ED Triage Notes (Signed)
Pt added that he has been out of his medication for about a week

## 2016-12-03 NOTE — Discharge Instructions (Signed)
Please read and follow all provided instructions.  Your diagnoses today include:  1. ETOH abuse   2. Anxiety     Tests performed today include: Vital signs. See below for your results today.   Medications prescribed:  Take as prescribed   Home care instructions:  Follow any educational materials contained in this packet.  Follow-up instructions: Please follow-up with your primary care provider for further evaluation of symptoms and treatment   Return instructions:  Please return to the Emergency Department if you do not get better, if you get worse, or new symptoms OR  - Fever (temperature greater than 101.61F)  - Bleeding that does not stop with holding pressure to the area    -Severe pain (please note that you may be more sore the day after your accident)  - Chest Pain  - Difficulty breathing  - Severe nausea or vomiting  - Inability to tolerate food and liquids  - Passing out  - Skin becoming red around your wounds  - Change in mental status (confusion or lethargy)  - New numbness or weakness    Please return if you have any other emergent concerns.  Additional Information:  Your vital signs today were: BP 188/89    Pulse 60    Resp 20    Ht '5\' 5"'$  (1.651 m)    Wt 63 kg    SpO2 100%    BMI 23.13 kg/m  If your blood pressure (BP) was elevated above 135/85 this visit, please have this repeated by your doctor within one month. ---------------

## 2016-12-03 NOTE — ED Provider Notes (Signed)
[facility] DEPT Provider Note   CSN: [id number] Arrival date & time: 12/03/16  0425     History   Chief Complaint Chief Complaint  Patient presents with  . Alcohol Problem    HPI David Lucero is a 71 y.o. male.  HPI  71 y.o. male with a hx of Anxiety, COPD, ETOH Abuse, presents to the Emergency Department today and states that he "fee(s) like hair is growing all over me." Notes hx of same. Denies pain. No N/V. Pt states that he drinks 8-9 beers daily with last ETOH intake yesterday. Notes hx of alcohol abuse to help calm his nerves. Seen on 09/07/16, 09/03/16, 07/24/16 for same. Seen by PCP on 11-21-16 for same. Pt states that he ran out of his medications Rxed by PCP for about 1 week. No CP/SOB/ABD pain.No fevers. No other symptoms noted.     Past Medical History:  Diagnosis Date  . Acid reflux    from throat cancer  . Anxiety   . Cancer ([place])   . COPD (chronic obstructive pulmonary disease) ([place])   . ETOH abuse   . H/O ETOH abuse   . Lung cancer ([place])   . PAD (peripheral artery disease) ([place])   . PTSD (post-traumatic stress disorder)     Patient Active Problem List   Diagnosis Date Noted  . PAD (peripheral artery disease) ([place]) 02/14/2014  . Chest pain 11/03/2013  . UNSPECIFIED HYPOTHYROIDISM 09/24/2008  . MORBID OBESITY 09/24/2008  . Alcohol abuse 09/24/2008  . PTSD 09/24/2008  . COPD 09/24/2008    History reviewed. No pertinent surgical history.     Home Medications    Prior to Admission medications   Medication Sig Start Date End Date Taking? Authorizing Provider  acetaminophen (TYLENOL) 500 MG tablet Take 1,000 mg by mouth daily. For pain    Historical Provider, MD  chlordiazePOXIDE (LIBRIUM) 25 MG capsule '50mg'$  PO TID x 1D, then 25-'50mg'$  PO BID X 1D, then 25-'50mg'$  PO QD X 1D Patient not taking: Reported on 06/09/2016 04/09/16   [facility] Ward, PA-C  hydrOXYzine (ATARAX/VISTARIL) 25 MG tablet Take 1 tablet (25 mg total) by mouth every 6 (six)  hours. Patient not taking: Reported on 02/18/2016 06/30/15   Orpah Greek, MD  LORazepam (ATIVAN) 1 MG tablet Take 0.5 tablets (0.5 mg total) by mouth daily as needed for anxiety. 06/09/16   [name], MD  traZODone (DESYREL) 100 MG tablet Take 1 tablet (100 mg total) by mouth at bedtime as needed for sleep. 09/07/16   Deno Etienne, DO    Family History Family History  Problem Relation Age of Onset  . Heart attack Father     Social History Social History  Substance Use Topics  . Smoking status: Current Every Day Smoker    Packs/day: 1.50    Years: 30.00    Types: Cigarettes  . Smokeless tobacco: Never Used  . Alcohol use 50.4 oz/week    84 Cans of beer per week     Comment: 12 or more beers/day     Allergies   Patient has no known allergies.   Review of Systems Review of Systems ROS reviewed and all are negative for acute change except as noted in the HPI.  Physical Exam Updated Vital Signs BP 158/98 (BP Location: Left Arm)   Pulse 82   Resp 17   Ht '5\' 5"'$  (1.651 m)   Wt 63 kg   SpO2 100%   BMI 23.13 kg/m   Physical Exam  Constitutional: He is oriented to person, place, and time. Vital signs are normal. He appears well-developed and well-nourished.  Chronically Ill Appearing  HENT:  Head: Normocephalic and atraumatic.  Right Ear: Hearing normal.  Left Ear: Hearing normal.  Eyes: Conjunctivae and EOM are normal. Pupils are equal, round, and reactive to light.  Neck: Normal range of motion. Neck supple.  Cardiovascular: Normal rate, regular rhythm, normal heart sounds and intact distal pulses.   Pulmonary/Chest: Effort normal and breath sounds normal. No respiratory distress. He has no wheezes. He has no rales. He exhibits no tenderness.  Abdominal: Soft. There is no tenderness.  Musculoskeletal: Normal range of motion.  No Tremors noted  Neurological: He is alert and oriented to person, place, and time.  Skin: Skin is warm and dry.  Psychiatric: He  has a normal mood and affect. His speech is normal and behavior is normal. Thought content normal.  Nursing note and vitals reviewed.  ED Treatments / Results  Labs (all labs ordered are listed, but only abnormal results are displayed) Labs Reviewed - No data to display  EKG  EKG Interpretation None       Radiology No results found.  Procedures Procedures (including critical care time)  Medications Ordered in ED Medications - No data to display   Initial Impression / Assessment and Plan / ED Course  I have reviewed the triage vital signs and the nursing notes.  Pertinent labs & imaging results that were available during my care of the patient were reviewed by me and considered in my medical decision making (see chart for details).  Final Clinical Impressions(s) / ED Diagnoses     {I have reviewed the relevant previous healthcare records.  {I obtained HPI from historian. {Patient discussed with supervising physician.   ED Course:  Assessment: Pt is a 70yM with hx ETOh Abuse who presents with anxiety as well as ETOH abuse. Noted hx same in past. Pt states he ran out of ativan that was Rxed by PCP. Notes ETOH intake with last intake last night. Noted 8-0 beers. States he does this daily. Notes taking Ativan daily. On exam, pt in NAD. Nontoxic/nonseptic appearing. VSS. Afebrile. Lungs CTA. Heart RRR. Abdomen nontender soft. No tremors noted. No signs of alcohol withdrawal. Pt ambulating in the hall. Seen by supervising physician and myself.  Given '1mg'$  Ativan PO. Counseled patient that we do not refill chronic medications. Advised to follow up to PCP. Plan is to DC home. At time of discharge, Patient is in no acute distress. Vital Signs are stable. Patient is able to ambulate. Patient able to tolerate PO.   Disposition/Plan:  DC Home Additional Verbal discharge instructions given and discussed with patient.  Pt Instructed to f/u with PCP in the next week for evaluation and  treatment of symptoms. Return precautions given Pt acknowledges and agrees with plan  Supervising Physician April Palumbo, MD  Final diagnoses:  ETOH abuse  Anxiety    New Prescriptions New Prescriptions   No medications on file     Shary Decamp, Hershal Coria 12/03/16 [id number]    April Palumbo, MD 12/03/16 [phone]

## 2016-12-03 NOTE — ED Notes (Addendum)
Pt reports that he has been drinking tonight and feels like hairs are growing all over him. Pt states he has not taken his lorazepam in over a week. Pt denies any SI/HI.

## 2016-12-03 NOTE — ED Triage Notes (Signed)
Pt states he drinks 8-9 beers daily  Last drank Tuesday night  Pt states he feels anxious and "I feel like hair is growing all over me"  Denies pain or nausea  Pt has poor physical appearance

## 2016-12-04 ENCOUNTER — Encounter (HOSPITAL_COMMUNITY): Payer: Self-pay | Admitting: Emergency Medicine

## 2016-12-04 ENCOUNTER — Emergency Department (HOSPITAL_COMMUNITY)
Admission: EM | Admit: 2016-12-04 | Discharge: 2016-12-04 | Disposition: A | Discharge status: Home / Self Care | Payer: Medicare Other | Attending: Emergency Medicine | Admitting: Emergency Medicine

## 2016-12-04 DIAGNOSIS — J449 Chronic obstructive pulmonary disease, unspecified: Secondary | ICD-10-CM | POA: Diagnosis not present

## 2016-12-04 DIAGNOSIS — E039 Hypothyroidism, unspecified: Secondary | ICD-10-CM | POA: Diagnosis not present

## 2016-12-04 DIAGNOSIS — Z85118 Personal history of other malignant neoplasm of bronchus and lung: Secondary | ICD-10-CM | POA: Diagnosis not present

## 2016-12-04 DIAGNOSIS — F1010 Alcohol abuse, uncomplicated: Secondary | ICD-10-CM | POA: Diagnosis present

## 2016-12-04 DIAGNOSIS — F17210 Nicotine dependence, cigarettes, uncomplicated: Secondary | ICD-10-CM | POA: Diagnosis not present

## 2016-12-04 MED ORDER — CARBAMAZEPINE 200 MG PO TABS: ORAL_TABLET | ORAL | 0 refills | Status: DC

## 2016-12-04 MED ORDER — LORAZEPAM 1 MG PO TABS
1.0000 mg | ORAL_TABLET | Freq: Once | ORAL | Status: AC
  Administered 2016-12-04: 1 mg via ORAL
  Filled 2016-12-04: qty 1

## 2016-12-04 NOTE — ED Notes (Signed)
Patient clearly vomited graham crackers and coke in trash can, but denies vomiting.

## 2016-12-04 NOTE — ED Triage Notes (Signed)
Pt requesting detox for ETOH states he drink a 12 pack or more daily denies whisky, wine, or street drugs. Last drink was 2 hours ago and had a beer

## 2016-12-04 NOTE — ED Provider Notes (Signed)
[facility] DEPT Provider Note   CSN: [id number] Arrival date & time: 12/04/16  0140     History   Chief Complaint Chief Complaint  Patient presents with  . Alcohol Problem    HPI David Lucero is a 71 y.o. male.  71 year old male with a history of anxiety, COPD, alcohol abuse presents to the emergency department for evaluation of complaints of anxiety. He states that he usually drinks a 12 pack of beer a day. His last drink was 2 hours prior to arrival area he states that he feels anxious with his "hair standing up". Patient in no visible distress. He is not tremulous. He has no complaints of pain. He was seen yesterday for similar symptoms and was given a tablet of Ativan. He reports that this helped him. He is requesting similar medication.      Past Medical History:  Diagnosis Date  . Acid reflux    from throat cancer  . Anxiety   . Cancer ([place])   . COPD (chronic obstructive pulmonary disease) ([place])   . ETOH abuse   . H/O ETOH abuse   . Lung cancer ([place])   . PAD (peripheral artery disease) ([place])   . PTSD (post-traumatic stress disorder)     Patient Active Problem List   Diagnosis Date Noted  . PAD (peripheral artery disease) ([place]) 02/14/2014  . Chest pain 11/03/2013  . UNSPECIFIED HYPOTHYROIDISM 09/24/2008  . MORBID OBESITY 09/24/2008  . Alcohol abuse 09/24/2008  . PTSD 09/24/2008  . COPD 09/24/2008    History reviewed. No pertinent surgical history.     Home Medications    Prior to Admission medications   Medication Sig Start Date End Date Taking? Authorizing Provider  acetaminophen (TYLENOL) 500 MG tablet Take 1,000 mg by mouth daily. For pain    Historical Provider, MD  carbamazepine (TEGRETOL) 200 MG tablet '800mg'$  PO QD X 1D, then '600mg'$  PO QD X 1D, then '400mg'$  QD X 1D, then '200mg'$  PO QD X 2D 12/04/16   Antonietta Breach, PA-C  traZODone (DESYREL) 100 MG tablet Take 1 tablet (100 mg total) by mouth at bedtime as needed for sleep. 09/07/16   Deno Etienne,  DO    Family History Family History  Problem Relation Age of Onset  . Heart attack Father     Social History Social History  Substance Use Topics  . Smoking status: Current Every Day Smoker    Packs/day: 1.50    Years: 30.00    Types: Cigarettes  . Smokeless tobacco: Never Used  . Alcohol use 50.4 oz/week    84 Cans of beer per week     Comment: 12 or more beers/day     Allergies   Patient has no known allergies.   Review of Systems Review of Systems Ten systems reviewed and are negative for acute change, except as noted in the HPI.    Physical Exam Updated Vital Signs BP 133/76   Pulse 65   Temp 98.6 F (37 C) (Oral)   Resp 18   SpO2 99%   Physical Exam  Constitutional: He is oriented to person, place, and time. He appears well-developed and well-nourished. No distress.  Nontoxic and in NAD  HENT:  Head: Normocephalic and atraumatic.  Eyes: Conjunctivae and EOM are normal. No scleral icterus.  Neck: Normal range of motion.  Cardiovascular: Normal rate, regular rhythm and intact distal pulses.   Pulmonary/Chest: Effort normal. No respiratory distress. He has no wheezes. He has no rales.  Respirations  even and unlabored  Musculoskeletal: Normal range of motion.  Neurological: He is alert and oriented to person, place, and time. He exhibits normal muscle tone. Coordination normal.  Patient moving all extremities; ambulatory with steady gait.  Skin: Skin is warm and dry. No rash noted. He is not diaphoretic. No erythema. No pallor.  Psychiatric: He has a normal mood and affect. His behavior is normal.  Nursing note and vitals reviewed.    ED Treatments / Results  Labs (all labs ordered are listed, but only abnormal results are displayed) Labs Reviewed - No data to display  EKG  EKG Interpretation None       Radiology No results found.  Procedures Procedures (including critical care time)  Medications Ordered in ED Medications  LORazepam  (ATIVAN) tablet 1 mg (not administered)     Initial Impression / Assessment and Plan / ED Course  I have reviewed the triage vital signs and the nursing notes.  Pertinent labs & imaging results that were available during my care of the patient were reviewed by me and considered in my medical decision making (see chart for details).     Patient with history of alcohol abuse. His last beer was 2 hours prior to arrival. Patient is up and ambulatory. He is not tremulous. No tachycardia or hypertension to suggest acute withdrawal. Patient is eating crackers and peanut butter. He is requesting medication for anxiety. Patient given 1 tablet of Ativan. Will discharge on Tegretol taper. Patient also given resource guide for outpatient follow-up for detox/rehabilitation. Return precautions discussed and provided. Patient discharged in stable condition with no unaddressed concerns.   Final Clinical Impressions(s) / ED Diagnoses   Final diagnoses:  Alcohol abuse    New Prescriptions New Prescriptions   CARBAMAZEPINE (TEGRETOL) 200 MG TABLET    '800mg'$  PO QD X 1D, then '600mg'$  PO QD X 1D, then '400mg'$  QD X 1D, then '200mg'$  PO QD X 2D     Antonietta Breach, PA-C 12/04/16 0304    April Palumbo, MD 12/04/16 0330

## 2016-12-27 ENCOUNTER — Encounter (HOSPITAL_COMMUNITY): Payer: Self-pay | Admitting: Emergency Medicine

## 2016-12-27 ENCOUNTER — Emergency Department (HOSPITAL_COMMUNITY)
Admission: EM | Admit: 2016-12-27 | Discharge: 2016-12-27 | Disposition: A | Discharge status: Home / Self Care | Payer: Medicare Other | Attending: Emergency Medicine | Admitting: Emergency Medicine

## 2016-12-27 DIAGNOSIS — J449 Chronic obstructive pulmonary disease, unspecified: Secondary | ICD-10-CM | POA: Diagnosis not present

## 2016-12-27 DIAGNOSIS — Z85118 Personal history of other malignant neoplasm of bronchus and lung: Secondary | ICD-10-CM | POA: Diagnosis not present

## 2016-12-27 DIAGNOSIS — D649 Anemia, unspecified: Secondary | ICD-10-CM

## 2016-12-27 DIAGNOSIS — E039 Hypothyroidism, unspecified: Secondary | ICD-10-CM | POA: Insufficient documentation

## 2016-12-27 DIAGNOSIS — R55 Syncope and collapse: Secondary | ICD-10-CM

## 2016-12-27 DIAGNOSIS — R42 Dizziness and giddiness: Secondary | ICD-10-CM | POA: Diagnosis present

## 2016-12-27 DIAGNOSIS — F17210 Nicotine dependence, cigarettes, uncomplicated: Secondary | ICD-10-CM | POA: Insufficient documentation

## 2016-12-27 DIAGNOSIS — Z79899 Other long term (current) drug therapy: Secondary | ICD-10-CM | POA: Diagnosis not present

## 2016-12-27 LAB — URINALYSIS, ROUTINE W REFLEX MICROSCOPIC
BILIRUBIN URINE: NEGATIVE
Glucose, UA: NEGATIVE mg/dL
Hgb urine dipstick: NEGATIVE
KETONES UR: 5 mg/dL — AB
LEUKOCYTES UA: NEGATIVE
NITRITE: NEGATIVE
Protein, ur: NEGATIVE mg/dL
Specific Gravity, Urine: 1.026 (ref 1.005–1.030)
pH: 5 (ref 5.0–8.0)

## 2016-12-27 LAB — DIFFERENTIAL
BASOS ABS: 0 10*3/uL (ref 0.0–0.1)
Basophils Relative: 0 %
Eosinophils Absolute: 0.1 10*3/uL (ref 0.0–0.7)
Eosinophils Relative: 1 %
LYMPHS PCT: 20 %
Lymphs Abs: 1.7 10*3/uL (ref 0.7–4.0)
MONO ABS: 0.5 10*3/uL (ref 0.1–1.0)
Monocytes Relative: 5 %
NEUTROS ABS: 6.3 10*3/uL (ref 1.7–7.7)
Neutrophils Relative %: 74 %

## 2016-12-27 LAB — CBC
HCT: 36.5 % — ABNORMAL LOW (ref 39.0–52.0)
HEMOGLOBIN: 11.8 g/dL — AB (ref 13.0–17.0)
MCH: 29.6 pg (ref 26.0–34.0)
MCHC: 32.3 g/dL (ref 30.0–36.0)
MCV: 91.5 fL (ref 78.0–100.0)
Platelets: 244 10*3/uL (ref 150–400)
RBC: 3.99 MIL/uL — AB (ref 4.22–5.81)
RDW: 13.9 % (ref 11.5–15.5)
WBC: 7.2 10*3/uL (ref 4.0–10.5)

## 2016-12-27 LAB — HEPATIC FUNCTION PANEL
ALT: 10 U/L — ABNORMAL LOW (ref 17–63)
AST: 17 U/L (ref 15–41)
Albumin: 3.9 g/dL (ref 3.5–5.0)
Alkaline Phosphatase: 44 U/L (ref 38–126)
TOTAL PROTEIN: 7.1 g/dL (ref 6.5–8.1)
Total Bilirubin: 0.4 mg/dL (ref 0.3–1.2)

## 2016-12-27 LAB — CBG MONITORING, ED: GLUCOSE-CAPILLARY: 92 mg/dL (ref 65–99)

## 2016-12-27 LAB — BASIC METABOLIC PANEL
ANION GAP: 10 (ref 5–15)
BUN: 48 mg/dL — ABNORMAL HIGH (ref 6–20)
CALCIUM: 9.2 mg/dL (ref 8.9–10.3)
CHLORIDE: 107 mmol/L (ref 101–111)
CO2: 22 mmol/L (ref 22–32)
CREATININE: 0.78 mg/dL (ref 0.61–1.24)
GFR calc non Af Amer: >60 mL/min (ref >60)
Glucose, Bld: 114 mg/dL — ABNORMAL HIGH (ref 65–99)
Potassium: 4.5 mmol/L (ref 3.5–5.1)
SODIUM: 139 mmol/L (ref 135–145)

## 2016-12-27 LAB — ETHANOL

## 2016-12-27 LAB — TROPONIN I

## 2016-12-27 LAB — POC OCCULT BLOOD, ED: FECAL OCCULT BLD: NEGATIVE

## 2016-12-27 MED ORDER — SODIUM CHLORIDE 0.9 % IV BOLUS (SEPSIS)
500.0000 mL | Freq: Once | INTRAVENOUS | Status: AC
  Administered 2016-12-27: 500 mL via INTRAVENOUS

## 2016-12-27 NOTE — Discharge Instructions (Signed)
Follow-up with Dr. M[place]ord Aase for further evaluation of your anemia. You should still have a week of your Ativan left.

## 2016-12-27 NOTE — ED Provider Notes (Signed)
[facility] DEPT Provider Note   CSN: [id number] Arrival date & time: 12/27/16  1845     History   Chief Complaint Chief Complaint  Patient presents with  . Dizziness  . Loss of Consciousness    HPI David Lucero is a 71 y.o. male.  HPI Patient presents with lightheadedness and possible syncope. States that he has felt lightheaded or passed out 3 times today. States he feels the hairs go up on his arms. He states that he feels anxious. States he needs some anxiety medicine. States he thinks he needs a shot. He is on chronic Ativan. States he ran out of it a couple days ago. States today is the day when it was due to run out. I reviewed records for it and it is due to run out in about a week. Has a history of overusing his Ativan. States he has been drinking alcohol also. States he drinks around 12 beers a day. He has been drinking today. No headache. No seizure activity. No injury from a fall. No chest pain. States he has had episodes like this in the past.  Past Medical History:  Diagnosis Date  . Acid reflux    from throat cancer  . Anxiety   . Cancer ([place])   . COPD (chronic obstructive pulmonary disease) ([place])   . ETOH abuse   . H/O ETOH abuse   . Lung cancer ([place])   . PAD (peripheral artery disease) ([place])   . PTSD (post-traumatic stress disorder)     Patient Active Problem List   Diagnosis Date Noted  . PAD (peripheral artery disease) ([place]) 02/14/2014  . Chest pain 11/03/2013  . UNSPECIFIED HYPOTHYROIDISM 09/24/2008  . MORBID OBESITY 09/24/2008  . Alcohol abuse 09/24/2008  . PTSD 09/24/2008  . COPD 09/24/2008    History reviewed. No pertinent surgical history.     Home Medications    Prior to Admission medications   Medication Sig Start Date End Date Taking? Authorizing Provider  acetaminophen (TYLENOL) 500 MG tablet Take 1,000 mg by mouth daily. For pain   Yes Historical Provider, MD  LORazepam (ATIVAN) 0.5 MG tablet Take 0.5 mg by mouth daily as  needed for anxiety. 12/08/16  Yes Historical Provider, MD  traZODone (DESYREL) 100 MG tablet Take 1 tablet (100 mg total) by mouth at bedtime as needed for sleep. 09/07/16  Yes Deno Etienne, DO  carbamazepine (TEGRETOL) 200 MG tablet '800mg'$  PO QD X 1D, then '600mg'$  PO QD X 1D, then '400mg'$  QD X 1D, then '200mg'$  PO QD X 2D Patient not taking: Reported on 12/27/2016 12/04/16   Antonietta Breach, PA-C    Family History Family History  Problem Relation Age of Onset  . Heart attack Father     Social History Social History  Substance Use Topics  . Smoking status: Current Every Day Smoker    Packs/day: 1.50    Years: 30.00    Types: Cigarettes  . Smokeless tobacco: Never Used  . Alcohol use 50.4 oz/week    84 Cans of beer per week     Comment: 12 or more beers/day     Allergies   Patient has no known allergies.   Review of Systems Review of Systems  Constitutional: Negative for fever.  HENT: Negative for congestion.   Respiratory: Negative for shortness of breath.   Cardiovascular: Negative for chest pain.  Gastrointestinal: Negative for abdominal pain.  Genitourinary: Negative for flank pain.  Musculoskeletal: Negative for arthralgias.  Skin: Negative for pallor.  Neurological: Positive for dizziness and syncope. Negative for weakness.  Psychiatric/Behavioral: The patient is nervous/anxious.      Physical Exam Updated Vital Signs BP 130/74   Pulse 87   Temp 97.9 F (36.6 C) (Oral)   Resp 18   SpO2 96%   Physical Exam  Constitutional: He appears well-developed.  HENT:  Head: Atraumatic.  Eyes: EOM are normal.  Neck: Neck supple.  Cardiovascular: Normal rate.   Pulmonary/Chest: Effort normal.  Abdominal: Soft.  Musculoskeletal: He exhibits no tenderness.  Neurological: He is alert.  Skin: Skin is warm. Capillary refill takes less than 2 seconds.     ED Treatments / Results  Labs (all labs ordered are listed, but only abnormal results are displayed) Labs Reviewed  BASIC  METABOLIC PANEL - Abnormal; Notable for the following:       Result Value   Glucose, Bld 114 (*)    BUN 48 (*)    All other components within normal limits  URINALYSIS, ROUTINE W REFLEX MICROSCOPIC - Abnormal; Notable for the following:    APPearance CLOUDY (*)    Ketones, ur 5 (*)    All other components within normal limits  HEPATIC FUNCTION PANEL - Abnormal; Notable for the following:    ALT 10 (*)    Bilirubin, Direct <0.1 (*)    All other components within normal limits  CBC - Abnormal; Notable for the following:    RBC 3.99 (*)    Hemoglobin 11.8 (*)    HCT 36.5 (*)    All other components within normal limits  ETHANOL  TROPONIN I  DIFFERENTIAL  CBG MONITORING, ED  POC OCCULT BLOOD, ED    EKG  EKG Interpretation  Date/Time:  Saturday December 27 2016 19:09:12 EDT Ventricular Rate:  94 PR Interval:    QRS Duration: 92 QT Interval:  360 QTC Calculation: 451 R Axis:   -24 Text Interpretation:  Sinus rhythm Borderline left axis deviation Abnormal R-wave progression, early transition Baseline wander in lead(s) V3 V4 Confirmed by Alvino Chapel  MD, Ovid Curd [phone]) on 12/27/2016 7:32:06 PM       Radiology No results found.  Procedures Procedures (including critical care time)  Medications Ordered in ED Medications  sodium chloride 0.9 % bolus 500 mL (0 mLs Intravenous Stopped 12/27/16 2214)     Initial Impression / Assessment and Plan / ED Course  I have reviewed the triage vital signs and the nursing notes.  Pertinent labs & imaging results that were available during my care of the patient were reviewed by me and considered in my medical decision making (see chart for details).     Patient presents with dizziness. States he feels anxious. He has run out of his Xanax a week early. Has a mild anemia. Guaiac negative. Feels better after IV fluids. Asking to leave. Will follow-up with his primary care doctor for further management of his anemia.  Final Clinical  Impressions(s) / ED Diagnoses   Final diagnoses:  Near syncope  Anemia, unspecified type    New Prescriptions Discharge Medication List as of 12/27/2016 10:29 PM       Davonna Belling, MD 12/27/16 2359

## 2016-12-27 NOTE — ED Triage Notes (Signed)
Patient here from home with complaints of dizziness and syncopal episode today. Hx of ETOH.

## 2017-01-07 ENCOUNTER — Emergency Department (HOSPITAL_COMMUNITY)
Admission: EM | Admit: 2017-01-07 | Discharge: 2017-01-08 | Disposition: A | Discharge status: Home / Self Care | Payer: Medicare Other | Attending: Emergency Medicine | Admitting: Emergency Medicine

## 2017-01-07 ENCOUNTER — Encounter (HOSPITAL_COMMUNITY): Payer: Self-pay

## 2017-01-07 DIAGNOSIS — F17210 Nicotine dependence, cigarettes, uncomplicated: Secondary | ICD-10-CM | POA: Diagnosis not present

## 2017-01-07 DIAGNOSIS — F419 Anxiety disorder, unspecified: Secondary | ICD-10-CM | POA: Diagnosis not present

## 2017-01-07 DIAGNOSIS — Z85118 Personal history of other malignant neoplasm of bronchus and lung: Secondary | ICD-10-CM | POA: Insufficient documentation

## 2017-01-07 DIAGNOSIS — J449 Chronic obstructive pulmonary disease, unspecified: Secondary | ICD-10-CM | POA: Insufficient documentation

## 2017-01-07 DIAGNOSIS — L299 Pruritus, unspecified: Secondary | ICD-10-CM | POA: Diagnosis not present

## 2017-01-07 NOTE — ED Notes (Signed)
Bed: [facility]2 Expected date:  Expected time:  Means of arrival:  Comments: 

## 2017-01-07 NOTE — ED Triage Notes (Signed)
Pt states that he needs something for his nerves that we gave him a shot of something last time,  Pt states that it feels like something is crawling all over him Pt is also in triage room 2 and has been spitting all over the floor and he has been provided a bag and tissue to spit in

## 2017-01-08 MED ORDER — DIPHENHYDRAMINE HCL 50 MG/ML IJ SOLN
25.0000 mg | Freq: Once | INTRAMUSCULAR | Status: AC
  Administered 2017-01-08: 25 mg via INTRAMUSCULAR
  Filled 2017-01-08: qty 1

## 2017-01-08 MED ORDER — LORAZEPAM 0.5 MG PO TABS
0.5000 mg | ORAL_TABLET | Freq: Once | ORAL | Status: AC
  Administered 2017-01-08: 0.5 mg via ORAL
  Filled 2017-01-08: qty 1

## 2017-01-08 NOTE — ED Provider Notes (Signed)
[facility] DEPT Provider Note   CSN: [id number] Arrival date & time: 01/07/17  1930     History   Chief Complaint Chief Complaint  Patient presents with  . Anxiety    HPI DONTREAL MIERA is a 71 y.o. male.  The history is provided by the patient. No language interpreter was used.  Anxiety  This is a chronic problem. The problem occurs constantly. The problem has been gradually worsening. Nothing aggravates the symptoms. Nothing relieves the symptoms. He has tried nothing for the symptoms.  Pt request a shot for itching.  Pt reports he has chronic itching.  Pt ask for anxiety medication.  He reports he runs out of medication frequently. He denies taking more than prescribed.  Pt thinks it is time to get it filled.  Pt admits to alcohol earlier today  Past Medical History:  Diagnosis Date  . Acid reflux    from throat cancer  . Anxiety   . Cancer ([place])   . COPD (chronic obstructive pulmonary disease) ([place])   . ETOH abuse   . H/O ETOH abuse   . Lung cancer ([place])   . PAD (peripheral artery disease) ([place])   . PTSD (post-traumatic stress disorder)     Patient Active Problem List   Diagnosis Date Noted  . PAD (peripheral artery disease) ([place]) 02/14/2014  . Chest pain 11/03/2013  . UNSPECIFIED HYPOTHYROIDISM 09/24/2008  . MORBID OBESITY 09/24/2008  . Alcohol abuse 09/24/2008  . PTSD 09/24/2008  . COPD 09/24/2008    History reviewed. No pertinent surgical history.     Home Medications    Prior to Admission medications   Medication Sig Start Date End Date Taking? Authorizing Provider  acetaminophen (TYLENOL) 500 MG tablet Take 1,000 mg by mouth daily. For pain   Yes Historical Provider, MD  LORazepam (ATIVAN) 0.5 MG tablet Take 0.5 mg by mouth daily as needed for anxiety. 12/08/16  Yes Historical Provider, MD  traZODone (DESYREL) 100 MG tablet Take 1 tablet (100 mg total) by mouth at bedtime as needed for sleep. 09/07/16  Yes Deno Etienne, DO  carbamazepine  (TEGRETOL) 200 MG tablet '800mg'$  PO QD X 1D, then '600mg'$  PO QD X 1D, then '400mg'$  QD X 1D, then '200mg'$  PO QD X 2D Patient not taking: Reported on 12/27/2016 12/04/16   Antonietta Breach, PA-C    Family History Family History  Problem Relation Age of Onset  . Heart attack Father     Social History Social History  Substance Use Topics  . Smoking status: Current Every Day Smoker    Packs/day: 1.50    Years: 30.00    Types: Cigarettes  . Smokeless tobacco: Never Used  . Alcohol use 50.4 oz/week    84 Cans of beer per week     Comment: 12 or more beers/day     Allergies   Patient has no known allergies.   Review of Systems Review of Systems  All other systems reviewed and are negative.    Physical Exam Updated Vital Signs BP 123/76 (BP Location: Right Arm)   Pulse 96   Temp 97.7 F (36.5 C) (Oral)   Resp 18   SpO2 100%   Physical Exam  Constitutional: He appears well-developed and well-nourished.  HENT:  Head: Normocephalic and atraumatic.  Eyes: Conjunctivae are normal.  Neck: Neck supple.  Cardiovascular: Normal rate and regular rhythm.   No murmur heard. Pulmonary/Chest: Effort normal and breath sounds normal. No respiratory distress.  Abdominal: Soft. There is no  tenderness.  Musculoskeletal: He exhibits no edema.  Neurological: He is alert.  Skin: Skin is warm and dry.  scratching  Psychiatric: He has a normal mood and affect.  anxious  Nursing note and vitals reviewed.    ED Treatments / Results  Labs (all labs ordered are listed, but only abnormal results are displayed) Labs Reviewed - No data to display  EKG  EKG Interpretation None       Radiology No results found.  Procedures Procedures (including critical care time)  Medications Ordered in ED Medications  LORazepam (ATIVAN) tablet 0.5 mg (0.5 mg Oral Given 01/08/17 0014)  diphenhydrAMINE (BENADRYL) injection 25 mg (25 mg Intramuscular Given 01/08/17 0014)     Initial Impression /  Assessment and Plan / ED Course  I have reviewed the triage vital signs and the nursing notes.  Pertinent labs & imaging results that were available during my care of the patient were reviewed by me and considered in my medical decision making (see chart for details).     Pt given IM benadryl.  Pt given ativan dose here.  He is advised to call his MD/Pharmacist tomorrow.  Final Clinical Impressions(s) / ED Diagnoses   Final diagnoses:  Anxiety  Itching    New Prescriptions Discharge Medication List as of 01/08/2017 12:08 AM    An After Visit Summary was printed and given to the patient.   Combs, PA-C 01/08/17 Kearny Liu, MD 01/08/17 [phone]

## 2017-01-08 NOTE — Discharge Instructions (Signed)
Discuss anxiety medications with your Physicain

## 2017-01-10 ENCOUNTER — Emergency Department (HOSPITAL_COMMUNITY)
Admission: EM | Admit: 2017-01-10 | Discharge: 2017-01-10 | Disposition: A | Discharge status: Home / Self Care | Payer: Medicare Other | Attending: Emergency Medicine | Admitting: Emergency Medicine

## 2017-01-10 DIAGNOSIS — J449 Chronic obstructive pulmonary disease, unspecified: Secondary | ICD-10-CM | POA: Diagnosis not present

## 2017-01-10 DIAGNOSIS — Z760 Encounter for issue of repeat prescription: Secondary | ICD-10-CM | POA: Insufficient documentation

## 2017-01-10 DIAGNOSIS — F17210 Nicotine dependence, cigarettes, uncomplicated: Secondary | ICD-10-CM | POA: Insufficient documentation

## 2017-01-10 DIAGNOSIS — F419 Anxiety disorder, unspecified: Secondary | ICD-10-CM | POA: Diagnosis not present

## 2017-01-10 DIAGNOSIS — Z85818 Personal history of malignant neoplasm of other sites of lip, oral cavity, and pharynx: Secondary | ICD-10-CM | POA: Insufficient documentation

## 2017-01-10 MED ORDER — LORAZEPAM 0.5 MG PO TABS: 0.5000 mg | ORAL_TABLET | Freq: Two times a day (BID) | ORAL | 0 refills | Status: DC

## 2017-01-10 MED ORDER — LORAZEPAM 1 MG PO TABS
1.0000 mg | ORAL_TABLET | Freq: Once | ORAL | Status: AC
  Administered 2017-01-10: 1 mg via ORAL
  Filled 2017-01-10: qty 1

## 2017-01-10 NOTE — ED Notes (Signed)
Pt looking for refill for his lorazepam. He is anxious, has not taken his medication in 3 days.

## 2017-01-10 NOTE — ED Provider Notes (Signed)
[facility] DEPT Provider Note   CSN: [id number] Arrival date & time: 01/10/17  0201     History   Chief Complaint Chief Complaint  Patient presents with  . Medication Refill    HPI David Lucero is a 71 y.o. male.  The history is provided by the patient.  Anxiety  This is a chronic problem. The current episode started more than 1 week ago. The problem occurs daily. The problem has been gradually worsening. Pertinent negatives include no chest pain and no shortness of breath. Nothing aggravates the symptoms. Nothing relieves the symptoms.  PT HERE FOR ANXIETY AND MED REFILL REPORTS WORSENING OF HIS ANXIETY REPORTS HE HAS BEEN OUT OF HIS ATIVAN FOR SEVERAL DAYS NO FEVER/VOMITING NO CP/SOB HE FEELS LIKE SOMETHING IS CRAWLING ON HIM  DENIES SI DENIES ACTIVE HALLUCINATIONS HE ADMITS TO DRINKING ETOH EVERDAY LAST DRINK YESTERDAY  HE WANTS TO SEE PCP IN 2 DAYS BUT NO APPOINTMENT IS SCHEDULED  Past Medical History:  Diagnosis Date  . Acid reflux    from throat cancer  . Anxiety   . Cancer ([place])   . COPD (chronic obstructive pulmonary disease) ([place])   . ETOH abuse   . H/O ETOH abuse   . Lung cancer ([place])   . PAD (peripheral artery disease) ([place])   . PTSD (post-traumatic stress disorder)     Patient Active Problem List   Diagnosis Date Noted  . PAD (peripheral artery disease) ([place]) 02/14/2014  . Chest pain 11/03/2013  . UNSPECIFIED HYPOTHYROIDISM 09/24/2008  . MORBID OBESITY 09/24/2008  . Alcohol abuse 09/24/2008  . PTSD 09/24/2008  . COPD 09/24/2008    No past surgical history on file.     Home Medications    Prior to Admission medications   Medication Sig Start Date End Date Taking? Authorizing Provider  acetaminophen (TYLENOL) 500 MG tablet Take 1,000 mg by mouth daily. For pain    Historical Provider, MD  carbamazepine (TEGRETOL) 200 MG tablet '800mg'$  PO QD X 1D, then '600mg'$  PO QD X 1D, then '400mg'$  QD X 1D, then '200mg'$  PO QD X 2D Patient not taking:  Reported on 12/27/2016 12/04/16   Antonietta Breach, PA-C  LORazepam (ATIVAN) 0.5 MG tablet Take 1 tablet (0.5 mg total) by mouth 2 (two) times daily. 01/10/17   Ripley Fraise, MD  traZODone (DESYREL) 100 MG tablet Take 1 tablet (100 mg total) by mouth at bedtime as needed for sleep. 09/07/16   Deno Etienne, DO    Family History Family History  Problem Relation Age of Onset  . Heart attack Father     Social History Social History  Substance Use Topics  . Smoking status: Current Every Day Smoker    Packs/day: 1.50    Years: 30.00    Types: Cigarettes  . Smokeless tobacco: Never Used  . Alcohol use 50.4 oz/week    84 Cans of beer per week     Comment: 12 or more beers/day     Allergies   Patient has no known allergies.   Review of Systems Review of Systems  Constitutional: Negative for fever.  Respiratory: Negative for shortness of breath.   Cardiovascular: Negative for chest pain.  Psychiatric/Behavioral: Negative for suicidal ideas.     Physical Exam Updated Vital Signs BP (!) 167/72   Pulse 68   Temp 97.9 F (36.6 C) (Oral)   Resp 18   Ht '5\' 5"'$  (1.651 m)   Wt 63.5 kg   SpO2 99%   BMI 23.30  kg/m   Physical Exam CONSTITUTIONAL: Elderly, disheveled, mildly anxious HEAD: Normocephalic/atraumatic EYES: EOMI ENMT: Mucous membranes moist NECK: supple no meningeal signs CV: S1/S2 noted, no murmurs/rubs/gallops noted LUNGS: Lungs are clear to auscultation bilaterally, no apparent distress ABDOMEN: soft, nontender NEURO: Pt is awake/alert/appropriate, moves all extremitiesx4.  No facial droop.  No tremor.  Ambulatory, no ataxia EXTREMITIES: pulses normal/equal, full ROM SKIN: warm, color normal PSYCH: mildly anxious  ED Treatments / Results  Labs (all labs ordered are listed, but only abnormal results are displayed) Labs Reviewed - No data to display  EKG  EKG Interpretation None       Radiology No results found.  Procedures Procedures (including critical  care time)  Medications Ordered in ED Medications  LORazepam (ATIVAN) tablet 1 mg (1 mg Oral Given 01/10/17 0525)     Initial Impression / Assessment and Plan / ED Course  I have reviewed the triage vital signs and the nursing notes.      Pt seen recently for same Here for worsening anxiety He has been off ativan for several days He will try to see PCP next week but no appt scheduled He is also a heavy ETOH drinker He is not actively withdrawing and NOT psychotic, but I am concerned that he is high risk for withdrawal given long h/o ETOH and BDZ use Will give 2 pills prescription Advised to f/u with PCP He has ride home  Narcotic database reviewed and considered in decision making   Final Clinical Impressions(s) / ED Diagnoses   Final diagnoses:  Anxiety    New Prescriptions Discharge Medication List as of 01/10/2017  5:21 AM       Ripley Fraise, MD 01/10/17 [phone]

## 2017-01-10 NOTE — ED Notes (Signed)
Bed: [facility] Expected date:  Expected time:  Means of arrival:  Comments: 

## 2017-01-10 NOTE — ED Notes (Addendum)
Pt stated that he feels bad all over and has anxiety. He said, "I need a shot of something, and I feel like hair is crawling all over me." Also, he is out of lorazepam.

## 2017-02-10 ENCOUNTER — Emergency Department (HOSPITAL_COMMUNITY)
Admission: EM | Admit: 2017-02-10 | Discharge: 2017-02-10 | Disposition: A | Discharge status: Home / Self Care | Payer: Medicare Other | Attending: Emergency Medicine | Admitting: Emergency Medicine

## 2017-02-10 ENCOUNTER — Encounter (HOSPITAL_COMMUNITY): Payer: Self-pay | Admitting: *Deleted

## 2017-02-10 DIAGNOSIS — J449 Chronic obstructive pulmonary disease, unspecified: Secondary | ICD-10-CM | POA: Insufficient documentation

## 2017-02-10 DIAGNOSIS — Z79899 Other long term (current) drug therapy: Secondary | ICD-10-CM | POA: Diagnosis not present

## 2017-02-10 DIAGNOSIS — F17210 Nicotine dependence, cigarettes, uncomplicated: Secondary | ICD-10-CM | POA: Insufficient documentation

## 2017-02-10 DIAGNOSIS — F419 Anxiety disorder, unspecified: Secondary | ICD-10-CM | POA: Insufficient documentation

## 2017-02-10 DIAGNOSIS — Z85118 Personal history of other malignant neoplasm of bronchus and lung: Secondary | ICD-10-CM | POA: Diagnosis not present

## 2017-02-10 MED ORDER — LORAZEPAM 0.5 MG PO TABS
0.5000 mg | ORAL_TABLET | Freq: Once | ORAL | Status: AC
  Administered 2017-02-10: 0.5 mg via ORAL
  Filled 2017-02-10: qty 1

## 2017-02-10 NOTE — ED Notes (Signed)
Bed: WA09 Expected date:  Expected time:  Means of arrival:  Comments: EMS 71 yo male with anxiety-out of Ativan triage

## 2017-02-10 NOTE — ED Provider Notes (Signed)
[facility] DEPT Provider Note   CSN: [id number] Arrival date & time: 02/10/17  2542     History   Chief Complaint Chief Complaint  Patient presents with  . Anxiety    HPI TIA GELB is a 71 y.o. male.  HPI Patient presents emergency department complaining of bugs crawling on him.  He states that he is out of his lorazepam.  He ran out 3 or 4 days ago and Lucero like he needs something for his "nerves".  He has long-standing history of recurrent alcohol abuse.  He states that he sees Dr. Management consultant for his lorazepam and needs to schedule appointment.  He has no other complaints at this time.  He is requesting something for his nerves and a medication refill of his benzodiazepines.   Past Medical History:  Diagnosis Date  . Acid reflux    from throat cancer  . Anxiety   . Cancer ([place])   . COPD (chronic obstructive pulmonary disease) ([place])   . ETOH abuse   . H/O ETOH abuse   . Lung cancer ([place])   . PAD (peripheral artery disease) ([place])   . PTSD (post-traumatic stress disorder)     Patient Active Problem List   Diagnosis Date Noted  . PAD (peripheral artery disease) ([place]) 02/14/2014  . Chest pain 11/03/2013  . UNSPECIFIED HYPOTHYROIDISM 09/24/2008  . MORBID OBESITY 09/24/2008  . Alcohol abuse 09/24/2008  . PTSD 09/24/2008  . COPD 09/24/2008    History reviewed. No pertinent surgical history.     Home Medications    Prior to Admission medications   Medication Sig Start Date End Date Taking? Authorizing Provider  acetaminophen (TYLENOL) 500 MG tablet Take 1,000 mg by mouth every 6 (six) hours as needed for moderate pain. For pain   Yes Historical Provider, MD  LORazepam (ATIVAN) 0.5 MG tablet Take 1 tablet (0.5 mg total) by mouth 2 (two) times daily. 01/10/17  Yes Ripley Fraise, MD  traZODone (DESYREL) 100 MG tablet Take 1 tablet (100 mg total) by mouth at bedtime as needed for sleep. 09/07/16  Yes Deno Etienne, DO  carbamazepine (TEGRETOL) 200 MG tablet '800mg'$   PO QD X 1D, then '600mg'$  PO QD X 1D, then '400mg'$  QD X 1D, then '200mg'$  PO QD X 2D Patient not taking: Reported on 12/27/2016 12/04/16   Antonietta Breach, PA-C    Family History Family History  Problem Relation Age of Onset  . Heart attack Father     Social History Social History  Substance Use Topics  . Smoking status: Current Every Day Smoker    Packs/day: 1.50    Years: 30.00    Types: Cigarettes  . Smokeless tobacco: Never Used  . Alcohol use 50.4 oz/week    84 Cans of beer per week     Comment: 12 or more beers/day     Allergies   Patient has no known allergies.   Review of Systems Review of Systems  All other systems reviewed and are negative.    Physical Exam Updated Vital Signs There were no vitals taken for this visit.  Physical Exam  Constitutional: He is oriented to person, place, and time. He appears well-developed and well-nourished.  HENT:  Head: Normocephalic.  Eyes: EOM are normal.  Neck: Normal range of motion.  Cardiovascular: Normal rate.   Pulmonary/Chest: Effort normal.  Abdominal: He exhibits no distension.  Musculoskeletal: Normal range of motion.  Neurological: He is alert and oriented to person, place, and time.  Psychiatric:  Anxious  appearing  Nursing note and vitals reviewed.    ED Treatments / Results  Labs (all labs ordered are listed, but only abnormal results are displayed) Labs Reviewed - No data to display  EKG  EKG Interpretation None       Radiology No results found.  Procedures Procedures (including critical care time)  Medications Ordered in ED Medications  LORazepam (ATIVAN) tablet 0.5 mg (not administered)     Initial Impression / Assessment and Plan / ED Course  I have reviewed the triage vital signs and the nursing notes.  Pertinent labs & imaging results that were available during my care of the patient were reviewed by me and considered in my medical decision making (see chart for details).     No  prescription for benzodiazepines given.  Single dose of 0.5 mg Ativan given in the ER.  Patient directed back towards his primary care doctor.  Medical screening examination completed.  No life-threatening emergency  Final Clinical Impressions(s) / ED Diagnoses   Final diagnoses:  Anxiety    New Prescriptions New Prescriptions   No medications on file     Jola Schmidt, MD 02/10/17 [phone]

## 2017-02-10 NOTE — ED Notes (Signed)
Patient is alert and oriented x3.  He was given DC instructions and follow up visit instructions.  Patient gave verbal understanding.  He was DC ambulatory under his own power to home.  V/S stable.  He was not showing any signs of distress on DC 

## 2017-02-10 NOTE — ED Triage Notes (Signed)
Patient is alert and oriented x4.  He is being seen for anxiety after he states that he ran out of his medication of his anxiety.  Patient denies any pain.

## 2017-02-20 ENCOUNTER — Encounter (HOSPITAL_COMMUNITY): Payer: Self-pay | Admitting: Emergency Medicine

## 2017-02-20 ENCOUNTER — Emergency Department (HOSPITAL_COMMUNITY)
Admission: EM | Admit: 2017-02-20 | Discharge: 2017-02-20 | Disposition: A | Discharge status: Home / Self Care | Payer: Medicare Other | Attending: Emergency Medicine | Admitting: Emergency Medicine

## 2017-02-20 DIAGNOSIS — Z85118 Personal history of other malignant neoplasm of bronchus and lung: Secondary | ICD-10-CM | POA: Diagnosis not present

## 2017-02-20 DIAGNOSIS — F419 Anxiety disorder, unspecified: Secondary | ICD-10-CM | POA: Insufficient documentation

## 2017-02-20 DIAGNOSIS — Z79899 Other long term (current) drug therapy: Secondary | ICD-10-CM | POA: Diagnosis not present

## 2017-02-20 DIAGNOSIS — L299 Pruritus, unspecified: Secondary | ICD-10-CM | POA: Diagnosis not present

## 2017-02-20 DIAGNOSIS — Z85818 Personal history of malignant neoplasm of other sites of lip, oral cavity, and pharynx: Secondary | ICD-10-CM | POA: Insufficient documentation

## 2017-02-20 DIAGNOSIS — F1010 Alcohol abuse, uncomplicated: Secondary | ICD-10-CM | POA: Diagnosis not present

## 2017-02-20 DIAGNOSIS — J449 Chronic obstructive pulmonary disease, unspecified: Secondary | ICD-10-CM | POA: Diagnosis not present

## 2017-02-20 DIAGNOSIS — F17210 Nicotine dependence, cigarettes, uncomplicated: Secondary | ICD-10-CM | POA: Insufficient documentation

## 2017-02-20 MED ORDER — LORAZEPAM 0.5 MG PO TABS
0.5000 mg | ORAL_TABLET | Freq: Once | ORAL | Status: AC
  Administered 2017-02-20: 0.5 mg via ORAL
  Filled 2017-02-20: qty 1

## 2017-02-20 MED ORDER — DIPHENHYDRAMINE HCL 50 MG/ML IJ SOLN
25.0000 mg | Freq: Once | INTRAMUSCULAR | Status: AC
  Administered 2017-02-20: 25 mg via INTRAMUSCULAR
  Filled 2017-02-20: qty 1

## 2017-02-20 NOTE — Discharge Instructions (Signed)
You need to get ongoing prescriptions from your primary care provider.   Please limit your drinking to 1-2 12 ounce beers a day.  You may take diphenhydramine (Benadryl) every four hours as needed for itching.

## 2017-02-20 NOTE — ED Triage Notes (Signed)
Pt states he has bad nerves and feels like things are crawling on his skin  Pt states he has felt this way for a week and it gets worse every day  Pt states he takes Ativan and ran out of them a week ago  Pt drinks beer daily, 8-9 a day

## 2017-02-20 NOTE — ED Provider Notes (Signed)
[facility] DEPT Provider Note   CSN: [id number] Arrival date & time: 02/20/17  0009  By signing my name below, I, Margit Banda, attest that this documentation has been prepared under the direction and in the presence of Delora Fuel, MD. Electronically Signed: Margit Banda, ED Scribe. 02/20/17. 1:40 AM.   History   Chief Complaint Chief Complaint  Patient presents with  . Withdrawal    HPI David Lucero is a 71 y.o. male with a PMHx of anxiety, ETOH abuse and PTSD who presents to the Emergency Department complaining of gradually worsening, generalized pruritic-ness that started about a week ago. Pt is all out of his ativan and recently saw his PCP who decided not to refill it. He currently feels anxious. Drinks ~ 7-8 beers/ day. Pt denies fever, chills or any other sx at this time.   PCP: [name], MD  The history is provided by the patient. No language interpreter was used.    Past Medical History:  Diagnosis Date  . Acid reflux    from throat cancer  . Anxiety   . Cancer ([place])   . COPD (chronic obstructive pulmonary disease) ([place])   . ETOH abuse   . H/O ETOH abuse   . Lung cancer ([place])   . PAD (peripheral artery disease) ([place])   . PTSD (post-traumatic stress disorder)     Patient Active Problem List   Diagnosis Date Noted  . PAD (peripheral artery disease) ([place]) 02/14/2014  . Chest pain 11/03/2013  . UNSPECIFIED HYPOTHYROIDISM 09/24/2008  . MORBID OBESITY 09/24/2008  . Alcohol abuse 09/24/2008  . PTSD 09/24/2008  . COPD 09/24/2008    History reviewed. No pertinent surgical history.     Home Medications    Prior to Admission medications   Medication Sig Start Date End Date Taking? Authorizing Provider  acetaminophen (TYLENOL) 500 MG tablet Take 1,000 mg by mouth every 6 (six) hours as needed for moderate pain. For pain    [provider]  LORazepam (ATIVAN) 0.5 MG tablet Take 1 tablet (0.5 mg total) by mouth 2 (two) times  daily. 01/10/17   Ripley Fraise, MD  traZODone (DESYREL) 100 MG tablet Take 1 tablet (100 mg total) by mouth at bedtime as needed for sleep. 09/07/16   Deno Etienne, DO    Family History Family History  Problem Relation Age of Onset  . Heart attack Father     Social History Social History  Substance Use Topics  . Smoking status: Current Every Day Smoker    Packs/day: 1.50    Years: 30.00    Types: Cigarettes  . Smokeless tobacco: Never Used  . Alcohol use 50.4 oz/week    84 Cans of beer per week     Comment: 12 or more beers/day     Allergies   Patient has no known allergies.   Review of Systems Review of Systems  Constitutional: Negative for chills and fever.  Psychiatric/Behavioral: The patient is nervous/anxious.   All other systems reviewed and are negative.    Physical Exam Updated Vital Signs BP (!) 168/88 (BP Location: Right Arm)   Pulse 65   Temp 98 F (36.7 C) (Oral)   Resp 18   SpO2 99%   Physical Exam  Constitutional: He is oriented to person, place, and time. He appears well-developed and well-nourished.  HENT:  Head: Normocephalic and atraumatic.  Eyes: EOM are normal. Pupils are equal, round, and reactive to light.  Neck: Normal range of motion. Neck supple.  No JVD present.  Cardiovascular: Normal rate, regular rhythm and normal heart sounds.   No murmur heard. Pulmonary/Chest: Effort normal and breath sounds normal. He has no wheezes. He has no rales. He exhibits no tenderness.  Abdominal: Soft. Bowel sounds are normal. He exhibits no distension and no mass. There is no tenderness.  Musculoskeletal: Normal range of motion. He exhibits no edema.  Lymphadenopathy:    He has no cervical adenopathy.  Neurological: He is alert and oriented to person, place, and time. No cranial nerve deficit. He exhibits normal muscle tone. Coordination normal.  Skin: Skin is warm and dry. No rash noted.  Psychiatric: He has a normal mood and affect. His behavior  is normal. Judgment and thought content normal.  Nursing note and vitals reviewed.    ED Treatments / Results  DIAGNOSTIC STUDIES: Oxygen Saturation is 99% on RA, normal by my interpretation.   COORDINATION OF CARE: 1:35 AM-Discussed next steps with pt. Pt verbalized understanding and is agreeable with the plan.    Labs (all labs ordered are listed, but only abnormal results are displayed) Labs Reviewed - No data to display  EKG  EKG Interpretation None       Radiology No results found.  Procedures Procedures (including critical care time)  Medications Ordered in ED Medications  diphenhydrAMINE (BENADRYL) injection 25 mg (not administered)  LORazepam (ATIVAN) tablet 0.5 mg (not administered)     Initial Impression / Assessment and Plan / ED Course  I have reviewed the triage vital signs and the nursing notes.  Pertinent labs & imaging results that were available during my care of the patient were reviewed by me and considered in my medical decision making (see chart for details).  Complaints of generalized itching and anxiety. Review of old records shows several ED visits for same including one visit on May 1. He was seen in his 61 office on May 8 and given an injection of Depo-Medrol, but was not given prescription for lorazepam. His record on the [place] controlled substance reporting system was reviewed and last lorazepam prescription was filled on April 12 and was for 30 tablets. He does admit to significant alcohol abuse. He is given a single dose of lorazepam in the ED and an injection of diphenhydramine. I recommended that he cut back on his alcohol consumption. Also, anxiety may need to be addressed by mental health and is given outpatient resources for same.  Final Clinical Impressions(s) / ED Diagnoses   Final diagnoses:  Anxiety  Itching  Alcohol abuse    New Prescriptions Current Discharge Medication List      I personally  performed the services described in this documentation, which was scribed in my presence. The recorded information has been reviewed and is accurate.      Delora Fuel, MD 67/70/34 [phone]

## 2017-02-22 ENCOUNTER — Emergency Department (HOSPITAL_COMMUNITY)
Admission: EM | Admit: 2017-02-22 | Discharge: 2017-02-23 | Disposition: A | Discharge status: Home / Self Care | Payer: Medicare Other | Attending: Emergency Medicine | Admitting: Emergency Medicine

## 2017-02-22 ENCOUNTER — Encounter (HOSPITAL_COMMUNITY): Payer: Self-pay | Admitting: Emergency Medicine

## 2017-02-22 DIAGNOSIS — J449 Chronic obstructive pulmonary disease, unspecified: Secondary | ICD-10-CM | POA: Diagnosis not present

## 2017-02-22 DIAGNOSIS — Z760 Encounter for issue of repeat prescription: Secondary | ICD-10-CM | POA: Insufficient documentation

## 2017-02-22 DIAGNOSIS — Z79899 Other long term (current) drug therapy: Secondary | ICD-10-CM | POA: Insufficient documentation

## 2017-02-22 DIAGNOSIS — F419 Anxiety disorder, unspecified: Secondary | ICD-10-CM | POA: Insufficient documentation

## 2017-02-22 DIAGNOSIS — Z85118 Personal history of other malignant neoplasm of bronchus and lung: Secondary | ICD-10-CM | POA: Diagnosis not present

## 2017-02-22 DIAGNOSIS — F17210 Nicotine dependence, cigarettes, uncomplicated: Secondary | ICD-10-CM | POA: Insufficient documentation

## 2017-02-22 MED ORDER — LORAZEPAM 0.5 MG PO TABS
0.5000 mg | ORAL_TABLET | Freq: Once | ORAL | Status: AC
  Administered 2017-02-22: 0.5 mg via ORAL
  Filled 2017-02-22: qty 1

## 2017-02-22 NOTE — ED Triage Notes (Signed)
Pt from home via EMS stating he needs a refill of his ativan rx. Pt has been out of med x 1 week and is starting to feel anxious

## 2017-02-22 NOTE — ED Provider Notes (Signed)
[facility] DEPT Provider Note   CSN: [id number] Arrival date & time: 02/22/17  2232  By signing my name below, I, David Lucero, attest that this documentation has been prepared under the direction and in the presence of [place]. Skylin Kennerson, PA-C. Electronically Signed: Ethelle Lyon Lucero, Scribe. 02/22/2017. 11:23 PM.  History   Chief Complaint Chief Complaint  Patient presents with  . Medication Refill   The history is provided by the patient and medical records. No language interpreter was used.   HPI Comments:  David Lucero is a 71 y.o. male with a PMHx of Anxiety, COPD, EtOH Abuse, Lung CA, and PAD, who presents to the Emergency Department requesting a refill of his Ativan. Pt reports feeling persistent nervousness s/p running out of his Ativan Rx. He states he continually misplaces his medications. He denies any other medical problems, pain, or complaints tonight. Pt is ambulatory and seen eating multiple graham crackers in the ED. Pt denies fever, SOB, and any other complaints at this time. PCP- Dr. Melford Aase  Past Medical History:  Diagnosis Date  . Acid reflux    from throat cancer  . Anxiety   . Cancer ([place])   . COPD (chronic obstructive pulmonary disease) ([place])   . ETOH abuse   . H/O ETOH abuse   . Lung cancer ([place])   . PAD (peripheral artery disease) ([place])   . PTSD (post-traumatic stress disorder)    Patient Active Problem List   Diagnosis Date Noted  . PAD (peripheral artery disease) ([place]) 02/14/2014  . Chest pain 11/03/2013  . UNSPECIFIED HYPOTHYROIDISM 09/24/2008  . MORBID OBESITY 09/24/2008  . Alcohol abuse 09/24/2008  . PTSD 09/24/2008  . COPD 09/24/2008   History reviewed. No pertinent surgical history.  Home Medications    Prior to Admission medications   Medication Sig Start Date End Date Taking? Authorizing Provider  acetaminophen (TYLENOL) 500 MG tablet Take 1,000 mg by mouth every 6 (six) hours as needed for moderate pain. For pain     [provider]  LORazepam (ATIVAN) 0.5 MG tablet Take 1 tablet (0.5 mg total) by mouth 2 (two) times daily. 01/10/17   Ripley Fraise, MD  traZODone (DESYREL) 100 MG tablet Take 1 tablet (100 mg total) by mouth at bedtime as needed for sleep. 09/07/16   Deno Etienne, DO   Family History Family History  Problem Relation Age of Onset  . Heart attack Father     Social History Social History  Substance Use Topics  . Smoking status: Current Every Day Smoker    Packs/day: 1.50    Years: 30.00    Types: Cigarettes  . Smokeless tobacco: Never Used  . Alcohol use 50.4 oz/week    84 Cans of beer per week     Comment: 12 or more beers/day    Allergies   Patient has no known allergies.   Review of Systems Review of Systems  Constitutional: Negative for fever.  Respiratory: Negative for shortness of breath.   Psychiatric/Behavioral: The patient is nervous/anxious.   All other systems reviewed and are negative.  Physical Exam Updated Vital Signs BP (!) 165/68 (BP Location: Left Arm)   Pulse 68   SpO2 100%   Physical Exam  Constitutional: He appears well-developed and well-nourished. No distress.  HENT:  Head: Normocephalic.  Eyes: Conjunctivae are normal. No scleral icterus.  Neck: Normal range of motion.  Cardiovascular: Normal rate, normal heart sounds and intact distal pulses.   Pulmonary/Chest: Effort normal and breath  sounds normal. He has no wheezes.  Clear and equal breath sounds despite congested cough.   Abdominal: Soft. He exhibits no distension.  Musculoskeletal: Normal range of motion.  Neurological: He is alert.  Skin: Skin is warm and dry.  Psychiatric: His mood appears anxious ( pt reports, but he appears calm on exam).  Nursing note and vitals reviewed.  ED Treatments / Results  DIAGNOSTIC STUDIES:  Oxygen Saturation is 100% on RA, normal by my interpretation.    COORDINATION OF CARE:  11:21 PM Discussed treatment plan with pt at bedside  including Ativan and pt agreed to plan.  Labs (all labs ordered are listed, but only abnormal results are displayed) Labs Reviewed - No data to display  EKG  EKG Interpretation None       Radiology No results found.  Procedures Procedures (including critical care time)  Medications Ordered in ED Medications  LORazepam (ATIVAN) tablet 0.5 mg (0.5 mg Oral Given 02/22/17 2326)     Initial Impression / Assessment and Plan / ED Course  I have reviewed the triage vital signs and the nursing notes.  Pertinent labs & imaging results that were available during my care of the patient were reviewed by me and considered in my medical decision making (see chart for details).     Pt reports anxiety after Running out of his Ativan. Patient given Ativan 0.5 mg by mouth here in the emergency department. He is ambulatory with steady gait. Denies all other symptoms. Presently denies chest pain shortness of breath. Patient needs to see primary care provider for refill of his Ativan. I will not write for this. Patient states understanding and is in agreement with the plan.  Final Clinical Impressions(s) / ED Diagnoses   Final diagnoses:  Anxiety    New Prescriptions Discharge Medication List as of 02/23/2017 12:17 AM      I personally performed the services described in this documentation, which was scribed in my presence. The recorded information has been reviewed and is accurate.     Milah Recht, Gwenlyn Perking 02/23/17 2072    Carmin Muskrat, MD 02/23/17 [phone]

## 2017-02-23 NOTE — Discharge Instructions (Signed)
1. Medications: usual home medications 2. Treatment: rest, drink plenty of fluids,  3. Follow Up: Please followup with your primary doctor in 1-2 days for discussion of your diagnoses and further evaluation after today's visit; if you do not have a primary care doctor use the resource guide provided to find one; Please return to the ER for worsening symptoms

## 2017-03-07 ENCOUNTER — Emergency Department (HOSPITAL_COMMUNITY)
Admission: EM | Admit: 2017-03-07 | Discharge: 2017-03-07 | Disposition: A | Discharge status: Home / Self Care | Payer: Medicare Other | Attending: Emergency Medicine | Admitting: Emergency Medicine

## 2017-03-07 ENCOUNTER — Encounter (HOSPITAL_COMMUNITY): Payer: Self-pay

## 2017-03-07 DIAGNOSIS — F419 Anxiety disorder, unspecified: Secondary | ICD-10-CM | POA: Diagnosis not present

## 2017-03-07 MED ORDER — LORAZEPAM 0.5 MG PO TABS
0.5000 mg | ORAL_TABLET | Freq: Once | ORAL | Status: AC
  Administered 2017-03-07: 0.5 mg via ORAL
  Filled 2017-03-07: qty 1

## 2017-03-07 NOTE — ED Provider Notes (Signed)
[facility] DEPT Provider Note   CSN: [id number] Arrival date & time: 03/07/17  0145   By signing my name below, I, Neta Mends, attest that this documentation has been prepared under the direction and in the presence of [employer], [place]. Electronically Signed: Neta Mends, ED Scribe. 03/07/2017. 1:56 AM.   History   Chief Complaint Chief Complaint  Patient presents with  . Medication Refill    The history is provided by the patient. No language interpreter was used.   HPI Comments:  David Lucero is a 71 y.o. male with PMHx of PTSD who presents to the Emergency Department here for a medication refill. Pt states that he has become increasingly anxious since running out of his medication. No alleviating factors noted. Pt denies any other symptoms. Of note, patient saw his PCP on 02/17/17 and was given a refill for 0.5mg  tablets of Ativan. Hx of ETOH abuse.   Past Medical History:  Diagnosis Date  . Acid reflux    from throat cancer  . Anxiety   . Cancer ([place])   . COPD (chronic obstructive pulmonary disease) ([place])   . ETOH abuse   . H/O ETOH abuse   . Lung cancer ([place])   . PAD (peripheral artery disease) ([place])   . PTSD (post-traumatic stress disorder)     Patient Active Problem List   Diagnosis Date Noted  . PAD (peripheral artery disease) ([place]) 02/14/2014  . Chest pain 11/03/2013  . UNSPECIFIED HYPOTHYROIDISM 09/24/2008  . MORBID OBESITY 09/24/2008  . Alcohol abuse 09/24/2008  . PTSD 09/24/2008  . COPD 09/24/2008    History reviewed. No pertinent surgical history.     Home Medications    Prior to Admission medications   Medication Sig Start Date End Date Taking? Authorizing Provider  acetaminophen (TYLENOL) 500 MG tablet Take 1,000 mg by mouth every 6 (six) hours as needed for moderate pain. For pain    [provider]  LORazepam (ATIVAN) 0.5 MG tablet Take 1 tablet (0.5 mg total) by mouth 2 (two) times daily. 01/10/17   Ripley Fraise, MD  traZODone (DESYREL) 100 MG tablet Take 1 tablet (100 mg total) by mouth at bedtime as needed for sleep. 09/07/16   Deno Etienne, DO    Family History Family History  Problem Relation Age of Onset  . Heart attack Father     Social History Social History  Substance Use Topics  . Smoking status: Current Every Day Smoker    Packs/day: 1.50    Years: 30.00    Types: Cigarettes  . Smokeless tobacco: Never Used  . Alcohol use 50.4 oz/week    84 Cans of beer per week     Comment: 12 or more beers/day     Allergies   Patient has no known allergies.   Review of Systems Review of Systems All systems reviewed and are negative for acute change except as noted in the HPI.   Physical Exam Updated Vital Signs BP (!) 189/77 (BP Location: Left Arm)   Pulse 85   Temp 98 F (36.7 C) (Oral)   Resp 20   SpO2 97%   Physical Exam  Constitutional: He is oriented to person, place, and time. He appears well-developed and well-nourished. No distress.  Nontoxic appearing and in no acute distress. Disheveled. Eating graham crackers.  HENT:  Head: Normocephalic and atraumatic.  Mouth/Throat: Oropharynx is clear and moist.  Eyes: Conjunctivae and EOM are normal. No scleral icterus.  Neck: Normal range  of motion.  Pulmonary/Chest: Effort normal. No respiratory distress. He has no wheezes.  Respirations even and unlabored  Abdominal: He exhibits no distension.  Musculoskeletal: Normal range of motion.  Neurological: He is alert and oriented to person, place, and time. He exhibits normal muscle tone. Coordination normal.  GCS 15. Patient moving all extremities.  Skin: Skin is warm and dry. No rash noted. He is not diaphoretic. No erythema. No pallor.  Psychiatric: He has a normal mood and affect. His behavior is normal.  Nursing note and vitals reviewed.    ED Treatments / Results  Labs (all labs ordered are listed, but only abnormal results are displayed) Labs Reviewed - No  data to display  EKG  EKG Interpretation None       Radiology No results found.  Procedures Procedures (including critical care time)  Medications Ordered in ED Medications  LORazepam (ATIVAN) tablet 0.5 mg (0.5 mg Oral Given 03/07/17 0207)     Initial Impression / Assessment and Plan / ED Course  I have reviewed the triage vital signs and the nursing notes.  Pertinent labs & imaging results that were available during my care of the patient were reviewed by me and considered in my medical decision making (see chart for details).     71 year old male presents to the emergency department for worsening anxiety. He has been seen in the emergency department numerous times for similar complaints. Patient requesting a shot for anxiety. He was given a tablet of 0.5 mg Ativan which is his normal prescription. This was felt by his primary doctor 2.5 weeks ago. The patient and instructed to follow-up with his doctor or she has any additional prescriptions. Return precautions provided at discharge. Patient agreeable to plan with no unaddressed concerns.   Final Clinical Impressions(s) / ED Diagnoses   Final diagnoses:  Chronic anxiety    New Prescriptions New Prescriptions   No medications on file   I personally performed the services described in this documentation, which was scribed in my presence. The recorded information has been reviewed and is accurate.       Antonietta Breach, PA-C 03/07/17 3546    Rolland Porter, MD 03/07/17 [phone]

## 2017-03-07 NOTE — ED Triage Notes (Signed)
Pt presents with [facility] from home. Complaining of anxiety and out of ativan. Hx of PTSD. Denies SOB or chest pain. A&Ox4. States "I just need a shot of something."

## 2017-03-12 ENCOUNTER — Encounter (HOSPITAL_COMMUNITY): Payer: Self-pay

## 2017-03-12 ENCOUNTER — Emergency Department (HOSPITAL_COMMUNITY)
Admission: EM | Admit: 2017-03-12 | Discharge: 2017-03-12 | Disposition: A | Discharge status: Home / Self Care | Payer: Medicare Other | Attending: Emergency Medicine | Admitting: Emergency Medicine

## 2017-03-12 DIAGNOSIS — F17210 Nicotine dependence, cigarettes, uncomplicated: Secondary | ICD-10-CM | POA: Diagnosis not present

## 2017-03-12 DIAGNOSIS — Z79899 Other long term (current) drug therapy: Secondary | ICD-10-CM | POA: Diagnosis not present

## 2017-03-12 DIAGNOSIS — Z765 Malingerer [conscious simulation]: Secondary | ICD-10-CM

## 2017-03-12 DIAGNOSIS — J449 Chronic obstructive pulmonary disease, unspecified: Secondary | ICD-10-CM | POA: Diagnosis not present

## 2017-03-12 DIAGNOSIS — F419 Anxiety disorder, unspecified: Secondary | ICD-10-CM | POA: Diagnosis not present

## 2017-03-12 NOTE — ED Provider Notes (Signed)
TIME SEEN: 4:33 AM  CHIEF COMPLAINT: "It's my nerves"  HPI: Patient is a 71 year old male with history of COPD, alcohol abuse, anxiety who frequents our emergency department requesting Ativan. States he is here because of anxiety and is requesting a dose of Ativan here. No SI, HI or hallucinations. No chest pain or shortness of breath. Ambulate without difficulty.  ROS: See HPI Constitutional: no fever  Eyes: no drainage  ENT: no runny nose   Cardiovascular:  no chest pain  Resp: no SOB  GI: no vomiting GU: no dysuria Integumentary: no rash  Allergy: no hives  Musculoskeletal: no leg swelling  Neurological: no slurred speech ROS otherwise negative  PAST MEDICAL HISTORY/PAST SURGICAL HISTORY:  Past Medical History:  Diagnosis Date  . Acid reflux    from throat cancer  . Anxiety   . Cancer ([place])   . COPD (chronic obstructive pulmonary disease) ([place])   . ETOH abuse   . H/O ETOH abuse   . Lung cancer ([place])   . PAD (peripheral artery disease) ([place])   . PTSD (post-traumatic stress disorder)     MEDICATIONS:  Prior to Admission medications   Medication Sig Start Date End Date Taking? Authorizing Provider  acetaminophen (TYLENOL) 500 MG tablet Take 1,000 mg by mouth every 6 (six) hours as needed for moderate pain. For pain   Yes [provider]  LORazepam (ATIVAN) 0.5 MG tablet Take 1 tablet (0.5 mg total) by mouth 2 (two) times daily. 01/10/17  Yes Ripley Fraise, MD  traZODone (DESYREL) 100 MG tablet Take 1 tablet (100 mg total) by mouth at bedtime as needed for sleep. Patient not taking: Reported on 03/12/2017 09/07/16   Deno Etienne, DO    ALLERGIES:  No Known Allergies  SOCIAL HISTORY:  Social History  Substance Use Topics  . Smoking status: Current Every Day Smoker    Packs/day: 1.50    Years: 30.00    Types: Cigarettes  . Smokeless tobacco: Never Used  . Alcohol use 50.4 oz/week    84 Cans of beer per week     Comment: 12 or more beers/day    FAMILY  HISTORY: Family History  Problem Relation Age of Onset  . Heart attack Father     EXAM: BP (!) 161/71 (BP Location: Left Arm)   Pulse 73   Temp 98.5 F (36.9 C)   Resp 16   Ht 5\' 9"  (1.753 m)   Wt 63.5 kg (140 lb)   SpO2 95%   BMI 20.67 kg/m  CONSTITUTIONAL: Alert and oriented and responds appropriately to questions. Afebrile and nontoxic, Elderly, chronically ill-appearing, in no distress HEAD: Normocephalic EYES: Conjunctivae clear, pupils appear equal, EOMI ENT: normal nose; moist mucous membranes NECK: Supple, no meningismus, no nuchal rigidity, no LAD  CARD: RRR; S1 and S2 appreciated; no murmurs, no clicks, no rubs, no gallops RESP: Normal chest excursion without splinting or tachypnea; breath sounds clear and equal bilaterally; no wheezes, no rhonchi, no rales, no hypoxia or respiratory distress, speaking full sentences ABD/GI: Normal bowel sounds; non-distended; soft, non-tender, no rebound, no guarding, no peritoneal signs, no hepatosplenomegaly BACK:  The back appears normal and is non-tender to palpation, there is no CVA tenderness EXT: Normal ROM in all joints; non-tender to palpation; no edema; normal capillary refill; no cyanosis, no calf tenderness or swelling    SKIN: Normal color for age and race; warm; no rash NEURO: Moves all extremities equally PSYCH: The patient's mood and manner are appropriate. Disheveled. Denies SI,  HI or hallucinations.  MEDICAL DECISION MAKING: Patient here requesting Ativan. Patient becomes loud, shouting when I discussed with him that I do not feel this is indicated. States he is anxious and states "doctors before have given it to me when I am here". He does not appear anxious and has no SI, HI or hallucinations. I suspect drug-seeking behavior. He has been here 10 times in the past 6 months. Discussed with him that he needs to follow-up with his primary care provider for this. I do not feel he needs Ativan here in the emergency department  are prescription for the same. He has no medical complaints. No psychiatric safety concerns. He has been screened for any emergent condition. I feel he is safe for discharge.  At this time, I do not feel there is any life-threatening condition present. I have reviewed and discussed all results (EKG, imaging, lab, urine as appropriate) and exam findings with patient/family. I have reviewed nursing notes and appropriate previous records.  I feel the patient is safe to be discharged home without further emergent workup and can continue workup as an outpatient as needed. Discussed usual and customary return precautions. Patient/family verbalize understanding and are comfortable with this plan.  Outpatient follow-up has been provided if needed. All questions have been answered.      Edna Grover, Delice Bison, DO 03/12/17 [phone]

## 2017-03-12 NOTE — ED Notes (Signed)
Patient has been to the ED for this issue multiple time.  Patient states that he ran out of his medication and is having a panic attack.  He states "All I want is my clonazepam".  Patient is also asking for coke and crackers.

## 2017-03-12 NOTE — ED Triage Notes (Signed)
Ran out of clonazepam one week ago and states his nerves are acting up tonight no other problems voiced.

## 2017-03-12 NOTE — Discharge Instructions (Signed)
This is a nonemergent condition. You need to follow-up with your primary care provider. You do not need Ativan from the emergency department.

## 2017-03-16 ENCOUNTER — Emergency Department (HOSPITAL_COMMUNITY)
Admission: EM | Admit: 2017-03-16 | Discharge: 2017-03-16 | Disposition: A | Discharge status: Home / Self Care | Payer: Medicare Other | Attending: Emergency Medicine | Admitting: Emergency Medicine

## 2017-03-16 ENCOUNTER — Encounter (HOSPITAL_COMMUNITY): Payer: Self-pay | Admitting: Emergency Medicine

## 2017-03-16 DIAGNOSIS — Z8659 Personal history of other mental and behavioral disorders: Secondary | ICD-10-CM

## 2017-03-16 DIAGNOSIS — Z79899 Other long term (current) drug therapy: Secondary | ICD-10-CM | POA: Insufficient documentation

## 2017-03-16 DIAGNOSIS — F4312 Post-traumatic stress disorder, chronic: Secondary | ICD-10-CM | POA: Diagnosis not present

## 2017-03-16 DIAGNOSIS — F17210 Nicotine dependence, cigarettes, uncomplicated: Secondary | ICD-10-CM | POA: Insufficient documentation

## 2017-03-16 DIAGNOSIS — F419 Anxiety disorder, unspecified: Secondary | ICD-10-CM | POA: Insufficient documentation

## 2017-03-16 DIAGNOSIS — Z85118 Personal history of other malignant neoplasm of bronchus and lung: Secondary | ICD-10-CM | POA: Insufficient documentation

## 2017-03-16 DIAGNOSIS — J449 Chronic obstructive pulmonary disease, unspecified: Secondary | ICD-10-CM | POA: Diagnosis not present

## 2017-03-16 MED ORDER — LORAZEPAM 1 MG PO TABS
1.0000 mg | ORAL_TABLET | Freq: Once | ORAL | Status: AC
  Administered 2017-03-16: 1 mg via ORAL
  Filled 2017-03-16: qty 1

## 2017-03-16 NOTE — ED Provider Notes (Signed)
[facility] DEPT Provider Note   CSN: [id number] Arrival date & time: 03/16/17  0130  By signing my name below, I, Margit Banda, attest that this documentation has been prepared under the direction and in the presence of Annabelle Rexroad, Barbette Hair, MD. Electronically Signed: Margit Banda, ED Scribe. 03/16/17. 3:07 AM.  History   Chief Complaint Chief Complaint  Patient presents with  . Anxiety    HPI David Lucero is a 71 y.o. male with a PMHx of PTSD who presents to the Emergency Department complaining of constant anxiety for the last seven days. Pt has run out of his ativan ~ 7 days ago and hasn't called his PCP to get it refilled. Associated sx include itchiness and agitation - History of similar symptoms in the past that he associates with anxiety. No modifying factors noted. Pt denies CP, SOB, SI, HI, fever, abdominal pain, nausea, vomiting, and diarrhea.  PCP: [name], MD  The history is provided by the patient. No language interpreter was used.    Past Medical History:  Diagnosis Date  . Acid reflux    from throat cancer  . Anxiety   . Cancer ([place])   . COPD (chronic obstructive pulmonary disease) ([place])   . ETOH abuse   . H/O ETOH abuse   . Lung cancer ([place])   . PAD (peripheral artery disease) ([place])   . PTSD (post-traumatic stress disorder)     Patient Active Problem List   Diagnosis Date Noted  . PAD (peripheral artery disease) ([place]) 02/14/2014  . Chest pain 11/03/2013  . UNSPECIFIED HYPOTHYROIDISM 09/24/2008  . MORBID OBESITY 09/24/2008  . Alcohol abuse 09/24/2008  . PTSD 09/24/2008  . COPD 09/24/2008    History reviewed. No pertinent surgical history.     Home Medications    Prior to Admission medications   Medication Sig Start Date End Date Taking? Authorizing Provider  acetaminophen (TYLENOL) 500 MG tablet Take 1,000 mg by mouth every 6 (six) hours as needed for moderate pain. For pain   Yes [provider]  LORazepam (ATIVAN)  0.5 MG tablet Take 1 tablet (0.5 mg total) by mouth 2 (two) times daily. 01/10/17  Yes Ripley Fraise, MD    Family History Family History  Problem Relation Age of Onset  . Heart attack Father     Social History Social History  Substance Use Topics  . Smoking status: Current Every Day Smoker    Packs/day: 1.50    Years: 30.00    Types: Cigarettes  . Smokeless tobacco: Never Used  . Alcohol use 50.4 oz/week    84 Cans of beer per week     Comment: 12 or more beers/day     Allergies   Patient has no known allergies.   Review of Systems Review of Systems  Constitutional: Negative for fever.  Respiratory: Negative for shortness of breath.   Cardiovascular: Negative for chest pain.  Gastrointestinal: Negative for abdominal pain, diarrhea, rectal pain and vomiting.  Psychiatric/Behavioral: Negative for suicidal ideas. The patient is nervous/anxious.   All other systems reviewed and are negative.    Physical Exam Updated Vital Signs BP (!) 148/58   Pulse 68   Temp 97.9 F (36.6 C) (Oral)   Resp 16   Ht 5\' 6"  (1.676 m)   Wt 63.5 kg (140 lb)   SpO2 99%   BMI 22.60 kg/m   Physical Exam  Constitutional: He is oriented to person, place, and time. No distress.  Elderly, slightly disheveled  HENT:  Head: Normocephalic and atraumatic.  Cardiovascular: Normal rate, regular rhythm and normal heart sounds.   No murmur heard. Pulmonary/Chest: Effort normal and breath sounds normal. No respiratory distress. He has no wheezes.  Abdominal: Soft. There is no tenderness.  Musculoskeletal: He exhibits no edema.  Neurological: He is alert and oriented to person, place, and time.  Skin: Skin is warm and dry.  Psychiatric:  Anxious appearing, no acute distress  Nursing note and vitals reviewed.    ED Treatments / Results  DIAGNOSTIC STUDIES: Oxygen Saturation is 99% on RA, normal by my interpretation.   COORDINATION OF CARE: 3:07 AM-Discussed next steps with pt which  includes calling his PCP tomorrow to get his prescription refilled. Pt verbalized understanding and is agreeable with the plan.    Labs (all labs ordered are listed, but only abnormal results are displayed) Labs Reviewed - No data to display  EKG  EKG Interpretation None       Radiology No results found.  Procedures Procedures (including critical care time)  Medications Ordered in ED Medications  LORazepam (ATIVAN) tablet 1 mg (1 mg Oral Given 03/16/17 0326)     Initial Impression / Assessment and Plan / ED Course  I have reviewed the triage vital signs and the nursing notes.  Pertinent labs & imaging results that were available during my care of the patient were reviewed by me and considered in my medical decision making (see chart for details).     Patient presents with reports of anxiety. Requesting refill of Ativan. He has no other physical complaints. I discussed with the patient that he will need to follow-up with his primary physician for any refills. However, will provide him 1 dose of Ativan here. He does have someone to drive him. He is otherwise nontoxic-appearing and vital signs are reassuring. I do not feel he needs further workup at this time.  After history, exam, and medical workup I feel the patient has been appropriately medically screened and is safe for discharge home. Pertinent diagnoses were discussed with the patient. Patient was given return precautions.   Final Clinical Impressions(s) / ED Diagnoses   Final diagnoses:  Anxiety  History of posttraumatic stress disorder (PTSD)    New Prescriptions New Prescriptions   No medications on file   I personally performed the services described in this documentation, which was scribed in my presence. The recorded information has been reviewed and is accurate.     Merryl Hacker, MD 03/16/17 [phone]

## 2017-03-16 NOTE — Discharge Instructions (Signed)
You were seen today requesting a refill of your anxiety medication. Your given 1 dose of Ativan. You need to call your doctor first thing in the morning for any additional refills of medication at home. You may also benefit from seeing a psychiatrist.

## 2017-03-16 NOTE — ED Triage Notes (Signed)
Pt reports that he "need something for my nerves... I'm out of my ativan... It is the only thing that helps."  Pt reports none in a week.  Pt also reports having 2-3 beers tonight

## 2017-03-18 ENCOUNTER — Encounter (HOSPITAL_COMMUNITY): Payer: Self-pay

## 2017-03-18 ENCOUNTER — Emergency Department (HOSPITAL_COMMUNITY)
Admission: EM | Admit: 2017-03-18 | Discharge: 2017-03-18 | Disposition: A | Discharge status: Home / Self Care | Payer: Medicare Other | Attending: Emergency Medicine | Admitting: Emergency Medicine

## 2017-03-18 DIAGNOSIS — J449 Chronic obstructive pulmonary disease, unspecified: Secondary | ICD-10-CM | POA: Diagnosis not present

## 2017-03-18 DIAGNOSIS — I70209 Unspecified atherosclerosis of native arteries of extremities, unspecified extremity: Secondary | ICD-10-CM | POA: Insufficient documentation

## 2017-03-18 DIAGNOSIS — F419 Anxiety disorder, unspecified: Secondary | ICD-10-CM | POA: Insufficient documentation

## 2017-03-18 DIAGNOSIS — C3490 Malignant neoplasm of unspecified part of unspecified bronchus or lung: Secondary | ICD-10-CM | POA: Diagnosis not present

## 2017-03-18 DIAGNOSIS — F17210 Nicotine dependence, cigarettes, uncomplicated: Secondary | ICD-10-CM | POA: Insufficient documentation

## 2017-03-18 MED ORDER — LORAZEPAM 1 MG PO TABS
0.5000 mg | ORAL_TABLET | Freq: Once | ORAL | Status: AC
  Administered 2017-03-18: 0.5 mg via ORAL
  Filled 2017-03-18: qty 1

## 2017-03-18 MED ORDER — LORAZEPAM 0.5 MG PO TABS: 0.5000 mg | ORAL_TABLET | Freq: Every day | ORAL | 0 refills | Status: DC

## 2017-03-18 NOTE — ED Notes (Signed)
Pt given graham crackers and Coke

## 2017-03-18 NOTE — ED Triage Notes (Signed)
Pt here for anxiety onset 1 week sts out of his nerve pill for 1 week.

## 2017-03-18 NOTE — ED Provider Notes (Addendum)
[facility] DEPT Provider Note   CSN: [id number] Arrival date & time: 03/18/17  0243     History   Chief Complaint Chief Complaint  Patient presents with  . Anxiety    HPI David Lucero is a 71 y.o. male.  HPI Patient presents with complaints of anxiety. Patient feels very anxious and jumpy. He reports he has been out of his Ativan for 7 days. He has not had any chest pain, palpitations, passing out, seizure. His anxiousness is similar to previous visits for anxiety. Past Medical History:  Diagnosis Date  . Acid reflux    from throat cancer  . Anxiety   . Cancer ([place])   . COPD (chronic obstructive pulmonary disease) ([place])   . ETOH abuse   . H/O ETOH abuse   . Lung cancer ([place])   . PAD (peripheral artery disease) ([place])   . PTSD (post-traumatic stress disorder)     Patient Active Problem List   Diagnosis Date Noted  . PAD (peripheral artery disease) ([place]) 02/14/2014  . Chest pain 11/03/2013  . UNSPECIFIED HYPOTHYROIDISM 09/24/2008  . MORBID OBESITY 09/24/2008  . Alcohol abuse 09/24/2008  . PTSD 09/24/2008  . COPD 09/24/2008    History reviewed. No pertinent surgical history.     Home Medications    Prior to Admission medications   Medication Sig Start Date End Date Taking? Authorizing Provider  acetaminophen (TYLENOL) 500 MG tablet Take 1,000 mg by mouth every 6 (six) hours as needed for moderate pain. For pain    [provider]  LORazepam (ATIVAN) 0.5 MG tablet Take 1 tablet (0.5 mg total) by mouth 2 (two) times daily. 01/10/17   Ripley Fraise, MD  LORazepam (ATIVAN) 0.5 MG tablet Take 1 tablet (0.5 mg total) by mouth at bedtime. 03/18/17   Orpah Greek, MD    Family History Family History  Problem Relation Age of Onset  . Heart attack Father     Social History Social History  Substance Use Topics  . Smoking status: Current Every Day Smoker    Packs/day: 1.50    Years: 30.00    Types: Cigarettes  . Smokeless tobacco:  Never Used  . Alcohol use 50.4 oz/week    84 Cans of beer per week     Comment: 12 or more beers/day     Allergies   Patient has no known allergies.   Review of Systems Review of Systems  Psychiatric/Behavioral: The patient is nervous/anxious.   All other systems reviewed and are negative.    Physical Exam Updated Vital Signs BP (!) 180/69 (BP Location: Left Arm)   Pulse 61   Temp 97.7 F (36.5 C) (Oral)   Resp 20   SpO2 100%   Physical Exam  Constitutional: He is oriented to person, place, and time. He appears well-developed and well-nourished. No distress.  HENT:  Head: Normocephalic and atraumatic.  Right Ear: Hearing normal.  Left Ear: Hearing normal.  Nose: Nose normal.  Mouth/Throat: Oropharynx is clear and moist and mucous membranes are normal.  Eyes: Conjunctivae and EOM are normal. Pupils are equal, round, and reactive to light.  Neck: Normal range of motion. Neck supple.  Cardiovascular: Regular rhythm, S1 normal and S2 normal.  Exam reveals no gallop and no friction rub.   No murmur heard. Pulmonary/Chest: Effort normal and breath sounds normal. No respiratory distress. He exhibits no tenderness.  Abdominal: Soft. Normal appearance and bowel sounds are normal. There is no hepatosplenomegaly. There is no tenderness. There  is no rebound, no guarding, no tenderness at McBurney's point and negative Murphy's sign. No hernia.  Musculoskeletal: Normal range of motion.  Neurological: He is alert and oriented to person, place, and time. He has normal strength. No cranial nerve deficit or sensory deficit. Coordination normal. GCS eye subscore is 4. GCS verbal subscore is 5. GCS motor subscore is 6.  Skin: Skin is warm, dry and intact. No rash noted. No cyanosis.  Psychiatric: His speech is normal and behavior is normal. Thought content normal. His mood appears anxious. He expresses no homicidal and no suicidal ideation.  Nursing note and vitals reviewed.    ED  Treatments / Results  Labs (all labs ordered are listed, but only abnormal results are displayed) Labs Reviewed - No data to display  EKG  EKG Interpretation None       Radiology No results found.  Procedures Procedures (including critical care time)  Medications Ordered in ED Medications  LORazepam (ATIVAN) tablet 0.5 mg (not administered)     Initial Impression / Assessment and Plan / ED Course  I have reviewed the triage vital signs and the nursing notes.  Pertinent labs & imaging results that were available during my care of the patient were reviewed by me and considered in my medical decision making (see chart for details).     Patient presents to the emergency department for evaluation of anxiety. He has a history of PTSD. Patient has had multiple similar visits. Patient appears well, does not require any workup at this time.  Final Clinical Impressions(s) / ED Diagnoses   Final diagnoses:  Anxiety    New Prescriptions New Prescriptions   LORAZEPAM (ATIVAN) 0.5 MG TABLET    Take 1 tablet (0.5 mg total) by mouth at bedtime.     Orpah Greek, MD 03/18/17 6415    Orpah Greek, MD 04/04/17 [phone]

## 2017-03-28 ENCOUNTER — Emergency Department (HOSPITAL_COMMUNITY)
Admission: EM | Admit: 2017-03-28 | Discharge: 2017-03-28 | Disposition: A | Discharge status: Home / Self Care | Payer: Medicare Other | Attending: Emergency Medicine | Admitting: Emergency Medicine

## 2017-03-28 ENCOUNTER — Encounter (HOSPITAL_COMMUNITY): Payer: Self-pay | Admitting: Emergency Medicine

## 2017-03-28 DIAGNOSIS — J449 Chronic obstructive pulmonary disease, unspecified: Secondary | ICD-10-CM | POA: Diagnosis not present

## 2017-03-28 DIAGNOSIS — Z79899 Other long term (current) drug therapy: Secondary | ICD-10-CM | POA: Insufficient documentation

## 2017-03-28 DIAGNOSIS — F17210 Nicotine dependence, cigarettes, uncomplicated: Secondary | ICD-10-CM | POA: Diagnosis not present

## 2017-03-28 DIAGNOSIS — F419 Anxiety disorder, unspecified: Secondary | ICD-10-CM

## 2017-03-28 DIAGNOSIS — E039 Hypothyroidism, unspecified: Secondary | ICD-10-CM | POA: Insufficient documentation

## 2017-03-28 MED ORDER — HYDROXYZINE HCL 50 MG/ML IM SOLN
25.0000 mg | Freq: Four times a day (QID) | INTRAMUSCULAR | Status: DC | PRN
  Administered 2017-03-28: 25 mg via INTRAMUSCULAR
  Filled 2017-03-28: qty 0.5

## 2017-03-28 NOTE — ED Notes (Addendum)
Pt walking around nurses station. Pt escorted back to room and asked to stay in room. Pt agreeable. RN informed pt of plan of care. Pt given snack.

## 2017-03-28 NOTE — Discharge Instructions (Addendum)
Please follow with your primary care doctor in the next 2 days for a check-up. They must obtain records for further management.  ° °Do not hesitate to return to the Emergency Department for any new, worsening or concerning symptoms.  ° °

## 2017-03-28 NOTE — ED Triage Notes (Signed)
Pt states "ive got real bad nerves, I take ativan and its not doing any good". Denies SI/HI. Pt states he cant sleep.

## 2017-03-28 NOTE — ED Provider Notes (Signed)
[facility] DEPT Provider Note   CSN: [id number] Arrival date & time: 03/28/17  1004     History   Chief Complaint Chief Complaint  Patient presents with  . Anxiety    HPI   Blood pressure 133/71, pulse 77, temperature 97.6 F (36.4 C), temperature source Oral, resp. rate 20, SpO2 100 %.  David Lucero is a 71 y.o. male complaining of feeling jittery and anxiety worsened this morning. States he is taking Ativan at home with little relief. States that this is typical of his anxiety and PTSD but it feels slightly worsened normal. States that he last drank last week in does not have alcohol withdrawal symptoms. Patient denies any chest pain, palpitations, shortness of breath. He states that his fingers feel jittery and he is generally anxious. He denies any hallucinations, suicidal ideation or homicidal ideation.  Past Medical History:  Diagnosis Date  . Acid reflux    from throat cancer  . Anxiety   . Cancer ([place])   . COPD (chronic obstructive pulmonary disease) ([place])   . ETOH abuse   . H/O ETOH abuse   . Lung cancer ([place])   . PAD (peripheral artery disease) ([place])   . PTSD (post-traumatic stress disorder)     Patient Active Problem List   Diagnosis Date Noted  . PAD (peripheral artery disease) ([place]) 02/14/2014  . Chest pain 11/03/2013  . UNSPECIFIED HYPOTHYROIDISM 09/24/2008  . MORBID OBESITY 09/24/2008  . Alcohol abuse 09/24/2008  . PTSD 09/24/2008  . COPD 09/24/2008    History reviewed. No pertinent surgical history.     Home Medications    Prior to Admission medications   Medication Sig Start Date End Date Taking? Authorizing Provider  LORazepam (ATIVAN) 0.5 MG tablet Take 1 tablet (0.5 mg total) by mouth at bedtime. Patient taking differently: Take 0.5 mg by mouth daily.  03/18/17  Yes Pollina, Gwenyth Allegra, MD  acetaminophen (TYLENOL) 500 MG tablet Take 1,000 mg by mouth every 6 (six) hours as needed for moderate pain. For pain    [provider]  LORazepam (ATIVAN) 0.5 MG tablet Take 1 tablet (0.5 mg total) by mouth 2 (two) times daily. Patient not taking: Reported on 03/28/2017 01/10/17   Ripley Fraise, MD    Family History Family History  Problem Relation Age of Onset  . Heart attack Father     Social History Social History  Substance Use Topics  . Smoking status: Current Every Day Smoker    Packs/day: 1.50    Years: 30.00    Types: Cigarettes  . Smokeless tobacco: Never Used  . Alcohol use 50.4 oz/week    84 Cans of beer per week     Comment: 12 or more beers/day     Allergies   Patient has no known allergies.   Review of Systems Review of Systems  A complete review of systems was obtained and all systems are negative except as noted in the HPI and PMH.    Physical Exam Updated Vital Signs BP 133/71 (BP Location: Right Arm)   Pulse 77   Temp 97.6 F (36.4 C) (Oral)   Resp 20   SpO2 100%   Physical Exam  Constitutional: He is oriented to person, place, and time. He appears well-developed and well-nourished. No distress.  HENT:  Head: Normocephalic and atraumatic.  Mouth/Throat: Oropharynx is clear and moist.  Eyes: Conjunctivae and EOM are normal. Pupils are equal, round, and reactive to light.  Neck: Normal range of motion.  Cardiovascular: Normal rate, regular rhythm and intact distal pulses.   Pulmonary/Chest: Effort normal and breath sounds normal.  Abdominal: Soft. There is no tenderness.  Musculoskeletal: Normal range of motion.  Neurological: He is alert and oriented to person, place, and time.  No tongue fasciculations  Skin: He is not diaphoretic.  Psychiatric:  Mildly anxious and intermittently  demanding  Nursing note and vitals reviewed.    ED Treatments / Results  Labs (all labs ordered are listed, but only abnormal results are displayed) Labs Reviewed - No data to display  EKG  EKG Interpretation None       Radiology No results  found.  Procedures Procedures (including critical care time)  Medications Ordered in ED Medications  hydrOXYzine (VISTARIL) injection 25 mg (not administered)     Initial Impression / Assessment and Plan / ED Course  I have reviewed the triage vital signs and the nursing notes.  Pertinent labs & imaging results that were available during my care of the patient were reviewed by me and considered in my medical decision making (see chart for details).     Vitals:   03/28/17 1014 03/28/17 1221 03/28/17 1227  BP: 133/71 (!) 172/102 (!) 157/95  Pulse: 77 (!) 58   Resp: 20 16   Temp: 97.6 F (36.4 C)    TempSrc: Oral    SpO2: 100% 98%     Medications  hydrOXYzine (VISTARIL) injection 25 mg (25 mg Intramuscular Given 03/28/17 1151)    David Lucero is 71 y.o. male presenting with Anxiousness and anxiety exacerbation secondary to his PTSD. Patient with multiple visits to the ED in the past. No associated symptoms to suggest a biologic source of his discomfort. Patient improved after injection of Vistaril. Will advise him to follow closely with PCP.  This is a shared visit with the attending physician who personally evaluated the patient and agrees with the care plan.   Evaluation does not show pathology that would require ongoing emergent intervention or inpatient treatment. Pt is hemodynamically stable and mentating appropriately. Discussed findings and plan with patient/guardian, who agrees with care plan. All questions answered. Return precautions discussed and outpatient follow up given.      Final Clinical Impressions(s) / ED Diagnoses   Final diagnoses:  None    New Prescriptions New Prescriptions   No medications on file     David Lucero 03/28/17 1351    Julianne Rice, MD 03/29/17 [phone]

## 2017-03-28 NOTE — ED Notes (Signed)
Pt given medication. Pt asked to stay on stretcher and pt agreeable. Pt given call bell.

## 2017-03-28 NOTE — ED Triage Notes (Signed)
Pt states he drank a couple beers this morning. Does not appear intoxicated.

## 2017-04-16 ENCOUNTER — Encounter (HOSPITAL_COMMUNITY): Payer: Self-pay

## 2017-04-16 ENCOUNTER — Emergency Department (HOSPITAL_COMMUNITY)
Admission: EM | Admit: 2017-04-16 | Discharge: 2017-04-16 | Disposition: A | Discharge status: Home / Self Care | Payer: Medicare Other | Attending: Emergency Medicine | Admitting: Emergency Medicine

## 2017-04-16 DIAGNOSIS — Z85118 Personal history of other malignant neoplasm of bronchus and lung: Secondary | ICD-10-CM | POA: Insufficient documentation

## 2017-04-16 DIAGNOSIS — F17210 Nicotine dependence, cigarettes, uncomplicated: Secondary | ICD-10-CM | POA: Diagnosis not present

## 2017-04-16 DIAGNOSIS — F1010 Alcohol abuse, uncomplicated: Secondary | ICD-10-CM | POA: Insufficient documentation

## 2017-04-16 DIAGNOSIS — E039 Hypothyroidism, unspecified: Secondary | ICD-10-CM | POA: Diagnosis not present

## 2017-04-16 DIAGNOSIS — J449 Chronic obstructive pulmonary disease, unspecified: Secondary | ICD-10-CM | POA: Diagnosis not present

## 2017-04-16 DIAGNOSIS — Z79899 Other long term (current) drug therapy: Secondary | ICD-10-CM | POA: Insufficient documentation

## 2017-04-16 DIAGNOSIS — F419 Anxiety disorder, unspecified: Secondary | ICD-10-CM | POA: Diagnosis not present

## 2017-04-16 DIAGNOSIS — Z765 Malingerer [conscious simulation]: Secondary | ICD-10-CM

## 2017-04-16 NOTE — ED Triage Notes (Signed)
Pt reports he is here today for "bad nerves" and states he feels anxious. He states he takes Ativan for anxiety but unable to get any until next week. Denies pain. Denies cough. No other complaints. Ambulatory independently, NAD.

## 2017-04-16 NOTE — ED Notes (Signed)
Unable to get temp 

## 2017-04-16 NOTE — Discharge Instructions (Signed)
Please follow-up with your primary care provider, Dr. Melford Aase. If you develop seeing or hearing things that may not actually be there, a tremor, or other new or worsening symptoms please return to the emergency department for re-evaluation.

## 2017-04-16 NOTE — ED Notes (Signed)
Pt here for Ativan. States "I lost mine somewhere". "I can't get anymore until next week".

## 2017-04-16 NOTE — ED Notes (Signed)
Pt given crackers and Coke.

## 2017-04-16 NOTE — ED Provider Notes (Signed)
[facility] DEPT Provider Note   CSN: [id number] Arrival date & time: 04/16/17  1243  By signing my name below, I, Theresia Bough, attest that this documentation has been prepared under the direction and in the presence of non-physician practitioner, Samer Dutton A., PA-C. Electronically Signed: Theresia Bough, ED Scribe. 04/16/17. 3:01 PM.   History   Chief Complaint Chief Complaint  Patient presents with  . Anxiety   The history is provided by the patient. No language interpreter was used.   HPI Comments: David Lucero is a 71 y.o. male who presents to the Emergency Department complaining of worsening anxiety that has been "going on for awhile" per pt. Pt states he takes Ativan that is prescribed by Dr. Melford Aase and that he ran out 3-4 days ago. Pt denies SI/HI, hallucinations or any other complaints at this time.   Past Medical History:  Diagnosis Date  . Acid reflux    from throat cancer  . Anxiety   . Cancer ([place])   . COPD (chronic obstructive pulmonary disease) ([place])   . ETOH abuse   . H/O ETOH abuse   . Lung cancer ([place])   . PAD (peripheral artery disease) ([place])   . PTSD (post-traumatic stress disorder)     Patient Active Problem List   Diagnosis Date Noted  . PAD (peripheral artery disease) ([place]) 02/14/2014  . Chest pain 11/03/2013  . UNSPECIFIED HYPOTHYROIDISM 09/24/2008  . MORBID OBESITY 09/24/2008  . Alcohol abuse 09/24/2008  . PTSD 09/24/2008  . COPD 09/24/2008    History reviewed. No pertinent surgical history.     Home Medications    Prior to Admission medications   Medication Sig Start Date End Date Taking? Authorizing Provider  acetaminophen (TYLENOL) 500 MG tablet Take 1,000 mg by mouth every 6 (six) hours as needed for moderate pain. For pain    [provider]  LORazepam (ATIVAN) 0.5 MG tablet Take 1 tablet (0.5 mg total) by mouth 2 (two) times daily. Patient not taking: Reported on 03/28/2017 01/10/17   Ripley Fraise, MD    LORazepam (ATIVAN) 0.5 MG tablet Take 1 tablet (0.5 mg total) by mouth at bedtime. Patient taking differently: Take 0.5 mg by mouth daily.  03/18/17   Orpah Greek, MD    Family History Family History  Problem Relation Age of Onset  . Heart attack Father     Social History Social History  Substance Use Topics  . Smoking status: Current Every Day Smoker    Packs/day: 1.50    Years: 30.00    Types: Cigarettes  . Smokeless tobacco: Never Used  . Alcohol use 50.4 oz/week    84 Cans of beer per week     Comment: 12 or more beers/day     Allergies   Patient has no known allergies.   Review of Systems Review of Systems  Constitutional: Negative for activity change.  Respiratory: Negative for shortness of breath.   Cardiovascular: Negative for chest pain.  Gastrointestinal: Negative for abdominal pain.  Musculoskeletal: Negative for back pain.  Skin: Negative for rash.  Psychiatric/Behavioral: Negative for hallucinations, self-injury and suicidal ideas. The patient is nervous/anxious.    Physical Exam Updated Vital Signs BP (!) 126/56 (BP Location: Right Arm)   Pulse 72   Temp 97.6 F (36.4 C) (Oral)   Resp 18   Ht 5\' 6"  (1.676 m)   Wt 73.9 kg (163 lb)   SpO2 97%   BMI 26.31 kg/m   Physical Exam  Constitutional:  He appears well-developed.  Disheveled appearence.   HENT:  Head: Normocephalic.  Eyes: Conjunctivae are normal.  Neck: Neck supple.  Cardiovascular: Normal rate, regular rhythm and normal heart sounds.  Exam reveals no gallop and no friction rub.   No murmur heard. Pulmonary/Chest: Effort normal and breath sounds normal. No respiratory distress. He has no wheezes. He has no rales.  Abdominal: Soft. He exhibits no distension.  Neurological: He is alert. He displays no tremor.  Skin: Skin is warm and dry.  Psychiatric: He has a normal mood and affect. His speech is normal and behavior is normal. Thought content normal. He expresses no homicidal  and no suicidal ideation. He expresses no suicidal plans and no homicidal plans.  Nursing note and vitals reviewed.  ED Treatments / Results  DIAGNOSTIC STUDIES: Oxygen Saturation is 97% on RA, normal by my interpretation.   COORDINATION OF CARE: 3:00 PM-Discussed next steps with pt. Pt verbalized understanding and is agreeable with the plan.   Labs (all labs ordered are listed, but only abnormal results are displayed) Labs Reviewed - No data to display  EKG  EKG Interpretation None       Radiology No results found.  Procedures Procedures (including critical care time)  Medications Ordered in ED Medications - No data to display   Initial Impression / Assessment and Plan / ED Course  I have reviewed the triage vital signs and the nursing notes.  Pertinent labs & imaging results that were available during my care of the patient were reviewed by me and considered in my medical decision making (see chart for details).     Patient with a history of numerous emergency department visits for anxiety presents to the emergency department after running out of his home Ativan 3 weeks into his 1 month prescription. Of note, 14 ED visits in the last 6 months. The patient appears disheveled. He is followed by Dr. Melford Aase who writes him for this prescription. He reports that he had called Dr. Fayrene Fearing office prior to coming to the emergency department and was told that it was too early for him to fill his prescription. After reviewing the [place] controlled substance reporting system, it appears that his prescription that was written on June 5 was filled by the pharmacy on June 12 for a 30 day supply. During the history, the patient states that he has not taken his medication in 3-4 days. No signs of benzodiazepine withdrawal noted on physical exam. The patient does not actively appear anxious. Nursing staff reports overhearing the patient discussing selling Ativan while he was still in  the waiting room. When asked this question during the patient's interview, the patient adamantly denies that he sells Ativan. The patient was offered a consult with TTS regarding worsening anxiety to evaluate for inpatient treatment, which the patient declines. The patient was discussed with Dr. Canary Brim, attending physician. Vital signs are stable. The patient is in no acute distress with no signs of benzodiazepine withdrawal. After reviewing the medical chart, and take 18 the patient's history and physical exam, Ativan will not be administered today nor will the patient be discharged with a prescription. No further emergent workup is indicated. At this time I will discharge the patient to home, with follow-up to his PCP.   Final Clinical Impressions(s) / ED Diagnoses   Final diagnoses:  Anxiety  Drug-seeking behavior    New Prescriptions Discharge Medication List as of 04/16/2017  3:16 PM     I personally performed the  services described in this documentation, which was scribed in my presence. The recorded information has been reviewed and is accurate.    Joanne Gavel, PA-C 04/16/17 2018    Alfonzo Beers, MD 04/18/17 [phone]

## 2017-06-30 ENCOUNTER — Encounter (HOSPITAL_COMMUNITY): Payer: Self-pay

## 2017-06-30 ENCOUNTER — Emergency Department (HOSPITAL_COMMUNITY)
Admission: EM | Admit: 2017-06-30 | Discharge: 2017-06-30 | Disposition: A | Discharge status: Home / Self Care | Payer: Medicare Other | Attending: Emergency Medicine | Admitting: Emergency Medicine

## 2017-06-30 DIAGNOSIS — F17210 Nicotine dependence, cigarettes, uncomplicated: Secondary | ICD-10-CM | POA: Insufficient documentation

## 2017-06-30 DIAGNOSIS — Z85118 Personal history of other malignant neoplasm of bronchus and lung: Secondary | ICD-10-CM | POA: Diagnosis not present

## 2017-06-30 DIAGNOSIS — F419 Anxiety disorder, unspecified: Secondary | ICD-10-CM | POA: Insufficient documentation

## 2017-06-30 DIAGNOSIS — L299 Pruritus, unspecified: Secondary | ICD-10-CM | POA: Diagnosis present

## 2017-06-30 DIAGNOSIS — Z79899 Other long term (current) drug therapy: Secondary | ICD-10-CM | POA: Insufficient documentation

## 2017-06-30 DIAGNOSIS — I739 Peripheral vascular disease, unspecified: Secondary | ICD-10-CM | POA: Diagnosis not present

## 2017-06-30 DIAGNOSIS — F1010 Alcohol abuse, uncomplicated: Secondary | ICD-10-CM | POA: Diagnosis not present

## 2017-06-30 DIAGNOSIS — J449 Chronic obstructive pulmonary disease, unspecified: Secondary | ICD-10-CM | POA: Diagnosis not present

## 2017-06-30 MED ORDER — CHLORDIAZEPOXIDE HCL 25 MG PO CAPS
25.0000 mg | ORAL_CAPSULE | Freq: Once | ORAL | Status: AC
  Administered 2017-06-30: 25 mg via ORAL
  Filled 2017-06-30: qty 1

## 2017-06-30 NOTE — ED Triage Notes (Signed)
Pt c/o anxiety and "feeling like something is crawling all over me" x "a couple months."  Denies pain.  Pt reports he has been seen previously for same, but has not followed up w/ PCP.

## 2017-06-30 NOTE — ED Provider Notes (Addendum)
[facility] DEPT Provider Note   CSN: [id number] Arrival date & time: 06/30/17  0441     History   Chief Complaint Chief Complaint  Patient presents with  . Pruritis  . Anxiety    HPI David Lucero is a 71 y.o. male.  71 year old male with history of alcohol abuse whose last drink was yesterday presents with increased anxiety. Patient states that he drinks between 8 and 9 beers a dayand his last drink was yesterday. He denies any tactile hallucinations. Denies any tremors. States he has had anxiety  For a long time and usually uses Ativan but has run out. He denies any associated abdominal . No changes to his stools. No suicidal or homicidal ideation Does not want to stop drinking. Did not call his doctor prior to  arrival      Past Medical History:  Diagnosis Date  . Acid reflux    from throat cancer  . Anxiety   . Cancer ([place])   . COPD (chronic obstructive pulmonary disease) ([place])   . ETOH abuse   . H/O ETOH abuse   . Lung cancer ([place])   . PAD (peripheral artery disease) ([place])   . PTSD (post-traumatic stress disorder)     Patient Active Problem List   Diagnosis Date Noted  . PAD (peripheral artery disease) ([place]) 02/14/2014  . Chest pain 11/03/2013  . UNSPECIFIED HYPOTHYROIDISM 09/24/2008  . MORBID OBESITY 09/24/2008  . Alcohol abuse 09/24/2008  . PTSD 09/24/2008  . COPD 09/24/2008    History reviewed. No pertinent surgical history.     Home Medications    Prior to Admission medications   Medication Sig Start Date End Date Taking? Authorizing Provider  acetaminophen (TYLENOL) 500 MG tablet Take 1,000 mg by mouth every 6 (six) hours as needed for moderate pain. For pain   Yes [provider]  LORazepam (ATIVAN) 0.5 MG tablet Take 1 tablet (0.5 mg total) by mouth at bedtime. Patient taking differently: Take 0.5 mg by mouth daily.  03/18/17  Yes Pollina, Gwenyth Allegra, MD  LORazepam (ATIVAN) 0.5 MG tablet Take 1 tablet (0.5 mg total) by mouth  2 (two) times daily. Patient not taking: Reported on 03/28/2017 01/10/17   Ripley Fraise, MD    Family History Family History  Problem Relation Age of Onset  . Heart attack Father     Social History Social History  Substance Use Topics  . Smoking status: Current Every Day Smoker    Packs/day: 1.00    Years: 30.00    Types: Cigarettes  . Smokeless tobacco: Never Used  . Alcohol use 50.4 oz/week    84 Cans of beer per week     Comment: 12 or more beers/day     Allergies   Patient has no known allergies.   Review of Systems Review of Systems  All other systems reviewed and are negative.    Physical Exam Updated Vital Signs BP (!) 154/82 (BP Location: Left Arm)   Pulse (!) 43   Temp 97.8 F (36.6 C) (Oral)   Resp 18   Ht 1.702 m (5\' 7" )   Wt 61.2 kg (135 lb)   SpO2 100%   BMI 21.14 kg/m   Physical Exam  Constitutional: He is oriented to person, place, and time. He appears well-developed and well-nourished.  Non-toxic appearance. No distress.  HENT:  Head: Normocephalic and atraumatic.  Eyes: Pupils are equal, round, and reactive to light. Conjunctivae, EOM and lids are normal.  Neck: Normal range  of motion. Neck supple. No tracheal deviation present. No thyroid mass present.  Cardiovascular: Normal rate, regular rhythm and normal heart sounds.  Exam reveals no gallop.   No murmur heard. Pulmonary/Chest: Effort normal and breath sounds normal. No stridor. No respiratory distress. He has no decreased breath sounds. He has no wheezes. He has no rhonchi. He has no rales.  Abdominal: Soft. Normal appearance and bowel sounds are normal. He exhibits no distension. There is no tenderness. There is no rebound and no CVA tenderness.  Musculoskeletal: Normal range of motion. He exhibits no edema or tenderness.  Neurological: He is alert and oriented to person, place, and time. He has normal strength. He displays no tremor. No cranial nerve deficit or sensory deficit. GCS  eye subscore is 4. GCS verbal subscore is 5. GCS motor subscore is 6.  Skin: Skin is warm and dry. No abrasion and no rash noted.  Psychiatric: His speech is normal and behavior is normal. His mood appears anxious. He is not actively hallucinating. He expresses no suicidal plans and no homicidal plans.  Nursing note and vitals reviewed.    ED Treatments / Results  Labs (all labs ordered are listed, but only abnormal results are displayed) Labs Reviewed - No data to display  EKG  EKG Interpretation None       Radiology No results found.  Procedures Procedures (including critical care time)  Medications Ordered in ED Medications  chlordiazePOXIDE (LIBRIUM) capsule 25 mg (not administered)     Initial Impression / Assessment and Plan / ED Course  I have reviewed the triage vital signs and the nursing notes.  Pertinent labs & imaging results that were available during my care of the patient were reviewed by me and considered in my medical decision making (see chart for details).     Patient given Librium ue to his CIWA of 9.patient has no plans to stop using alcohol and states he will drink when he leaves here. I will not prescribe him a Librium taper because of this. He was instructed to follow-up with his physician for a refill of his Ativan  Final Clinical Impressions(s) / ED Diagnoses   Final diagnoses:  None    New Prescriptions New Prescriptions   No medications on file     Lacretia Leigh, MD 06/30/17 0848    Lacretia Leigh, MD 06/30/17 [phone]

## 2017-07-25 ENCOUNTER — Emergency Department (HOSPITAL_COMMUNITY): Payer: Medicare Other

## 2017-07-25 ENCOUNTER — Inpatient Hospital Stay (HOSPITAL_COMMUNITY)
Admission: EM | Admit: 2017-07-25 | Discharge: 2017-08-10 | DRG: 280 | Disposition: A | Discharge status: Skilled Nursing Facility | Payer: Medicare Other | Attending: Internal Medicine | Admitting: Internal Medicine

## 2017-07-25 ENCOUNTER — Encounter (HOSPITAL_COMMUNITY): Payer: Self-pay | Admitting: Emergency Medicine

## 2017-07-25 DIAGNOSIS — R9431 Abnormal electrocardiogram [ECG] [EKG]: Secondary | ICD-10-CM | POA: Diagnosis not present

## 2017-07-25 DIAGNOSIS — R338 Other retention of urine: Secondary | ICD-10-CM | POA: Diagnosis not present

## 2017-07-25 DIAGNOSIS — J9601 Acute respiratory failure with hypoxia: Secondary | ICD-10-CM | POA: Diagnosis not present

## 2017-07-25 DIAGNOSIS — Y92009 Unspecified place in unspecified non-institutional (private) residence as the place of occurrence of the external cause: Secondary | ICD-10-CM

## 2017-07-25 DIAGNOSIS — N179 Acute kidney failure, unspecified: Secondary | ICD-10-CM | POA: Diagnosis present

## 2017-07-25 DIAGNOSIS — I480 Paroxysmal atrial fibrillation: Secondary | ICD-10-CM | POA: Diagnosis present

## 2017-07-25 DIAGNOSIS — I739 Peripheral vascular disease, unspecified: Secondary | ICD-10-CM | POA: Diagnosis present

## 2017-07-25 DIAGNOSIS — D638 Anemia in other chronic diseases classified elsewhere: Secondary | ICD-10-CM | POA: Diagnosis present

## 2017-07-25 DIAGNOSIS — R739 Hyperglycemia, unspecified: Secondary | ICD-10-CM | POA: Diagnosis present

## 2017-07-25 DIAGNOSIS — Z6820 Body mass index (BMI) 20.0-20.9, adult: Secondary | ICD-10-CM | POA: Diagnosis not present

## 2017-07-25 DIAGNOSIS — R001 Bradycardia, unspecified: Secondary | ICD-10-CM | POA: Diagnosis present

## 2017-07-25 DIAGNOSIS — S42202D Unspecified fracture of upper end of left humerus, subsequent encounter for fracture with routine healing: Secondary | ICD-10-CM | POA: Diagnosis not present

## 2017-07-25 DIAGNOSIS — F1010 Alcohol abuse, uncomplicated: Secondary | ICD-10-CM | POA: Diagnosis not present

## 2017-07-25 DIAGNOSIS — R410 Disorientation, unspecified: Secondary | ICD-10-CM | POA: Diagnosis not present

## 2017-07-25 DIAGNOSIS — R6510 Systemic inflammatory response syndrome (SIRS) of non-infectious origin without acute organ dysfunction: Secondary | ICD-10-CM | POA: Diagnosis present

## 2017-07-25 DIAGNOSIS — E43 Unspecified severe protein-calorie malnutrition: Secondary | ICD-10-CM | POA: Diagnosis present

## 2017-07-25 DIAGNOSIS — I2119 ST elevation (STEMI) myocardial infarction involving other coronary artery of inferior wall: Principal | ICD-10-CM | POA: Diagnosis present

## 2017-07-25 DIAGNOSIS — S42412A Displaced simple supracondylar fracture without intercondylar fracture of left humerus, initial encounter for closed fracture: Secondary | ICD-10-CM | POA: Diagnosis not present

## 2017-07-25 DIAGNOSIS — I2111 ST elevation (STEMI) myocardial infarction involving right coronary artery: Secondary | ICD-10-CM | POA: Diagnosis not present

## 2017-07-25 DIAGNOSIS — F0390 Unspecified dementia without behavioral disturbance: Secondary | ICD-10-CM | POA: Diagnosis present

## 2017-07-25 DIAGNOSIS — F4310 Post-traumatic stress disorder, unspecified: Secondary | ICD-10-CM | POA: Diagnosis present

## 2017-07-25 DIAGNOSIS — T68XXXA Hypothermia, initial encounter: Secondary | ICD-10-CM | POA: Diagnosis not present

## 2017-07-25 DIAGNOSIS — S42292A Other displaced fracture of upper end of left humerus, initial encounter for closed fracture: Secondary | ICD-10-CM | POA: Diagnosis not present

## 2017-07-25 DIAGNOSIS — Z85118 Personal history of other malignant neoplasm of bronchus and lung: Secondary | ICD-10-CM

## 2017-07-25 DIAGNOSIS — I213 ST elevation (STEMI) myocardial infarction of unspecified site: Secondary | ICD-10-CM | POA: Diagnosis not present

## 2017-07-25 DIAGNOSIS — D649 Anemia, unspecified: Secondary | ICD-10-CM | POA: Diagnosis not present

## 2017-07-25 DIAGNOSIS — K219 Gastro-esophageal reflux disease without esophagitis: Secondary | ICD-10-CM | POA: Diagnosis present

## 2017-07-25 DIAGNOSIS — S42202A Unspecified fracture of upper end of left humerus, initial encounter for closed fracture: Secondary | ICD-10-CM | POA: Diagnosis present

## 2017-07-25 DIAGNOSIS — F10921 Alcohol use, unspecified with intoxication delirium: Secondary | ICD-10-CM | POA: Diagnosis not present

## 2017-07-25 DIAGNOSIS — R748 Abnormal levels of other serum enzymes: Secondary | ICD-10-CM | POA: Diagnosis not present

## 2017-07-25 DIAGNOSIS — E872 Acidosis, unspecified: Secondary | ICD-10-CM

## 2017-07-25 DIAGNOSIS — F17210 Nicotine dependence, cigarettes, uncomplicated: Secondary | ICD-10-CM | POA: Diagnosis present

## 2017-07-25 DIAGNOSIS — X31XXXA Exposure to excessive natural cold, initial encounter: Secondary | ICD-10-CM

## 2017-07-25 DIAGNOSIS — R1312 Dysphagia, oropharyngeal phase: Secondary | ICD-10-CM | POA: Diagnosis not present

## 2017-07-25 DIAGNOSIS — D509 Iron deficiency anemia, unspecified: Secondary | ICD-10-CM | POA: Diagnosis present

## 2017-07-25 DIAGNOSIS — Z23 Encounter for immunization: Secondary | ICD-10-CM

## 2017-07-25 DIAGNOSIS — Z66 Do not resuscitate: Secondary | ICD-10-CM | POA: Diagnosis present

## 2017-07-25 DIAGNOSIS — W19XXXA Unspecified fall, initial encounter: Secondary | ICD-10-CM | POA: Diagnosis present

## 2017-07-25 DIAGNOSIS — J690 Pneumonitis due to inhalation of food and vomit: Secondary | ICD-10-CM | POA: Diagnosis not present

## 2017-07-25 DIAGNOSIS — Z8521 Personal history of malignant neoplasm of larynx: Secondary | ICD-10-CM | POA: Diagnosis not present

## 2017-07-25 DIAGNOSIS — R1319 Other dysphagia: Secondary | ICD-10-CM

## 2017-07-25 DIAGNOSIS — J449 Chronic obstructive pulmonary disease, unspecified: Secondary | ICD-10-CM | POA: Diagnosis present

## 2017-07-25 DIAGNOSIS — E876 Hypokalemia: Secondary | ICD-10-CM | POA: Diagnosis present

## 2017-07-25 DIAGNOSIS — R1310 Dysphagia, unspecified: Secondary | ICD-10-CM | POA: Diagnosis not present

## 2017-07-25 DIAGNOSIS — J189 Pneumonia, unspecified organism: Secondary | ICD-10-CM

## 2017-07-25 DIAGNOSIS — F10221 Alcohol dependence with intoxication delirium: Secondary | ICD-10-CM | POA: Diagnosis not present

## 2017-07-25 DIAGNOSIS — R093 Abnormal sputum: Secondary | ICD-10-CM

## 2017-07-25 DIAGNOSIS — M6282 Rhabdomyolysis: Secondary | ICD-10-CM | POA: Diagnosis present

## 2017-07-25 DIAGNOSIS — F419 Anxiety disorder, unspecified: Secondary | ICD-10-CM | POA: Diagnosis present

## 2017-07-25 DIAGNOSIS — Z79899 Other long term (current) drug therapy: Secondary | ICD-10-CM

## 2017-07-25 DIAGNOSIS — F329 Major depressive disorder, single episode, unspecified: Secondary | ICD-10-CM | POA: Diagnosis present

## 2017-07-25 DIAGNOSIS — F10239 Alcohol dependence with withdrawal, unspecified: Secondary | ICD-10-CM | POA: Diagnosis present

## 2017-07-25 DIAGNOSIS — R627 Adult failure to thrive: Secondary | ICD-10-CM | POA: Diagnosis present

## 2017-07-25 DIAGNOSIS — R339 Retention of urine, unspecified: Secondary | ICD-10-CM | POA: Diagnosis present

## 2017-07-25 LAB — URINALYSIS, ROUTINE W REFLEX MICROSCOPIC
Bilirubin Urine: NEGATIVE
GLUCOSE, UA: NEGATIVE mg/dL
Ketones, ur: 20 mg/dL — AB
LEUKOCYTES UA: NEGATIVE
Nitrite: NEGATIVE
PROTEIN: 30 mg/dL — AB
SQUAMOUS EPITHELIAL / LPF: NONE SEEN
Specific Gravity, Urine: 1.01 (ref 1.005–1.030)
pH: 6 (ref 5.0–8.0)

## 2017-07-25 LAB — COMPREHENSIVE METABOLIC PANEL
ALBUMIN: 2.7 g/dL — AB (ref 3.5–5.0)
ALBUMIN: 2.6 g/dL — AB (ref 3.5–5.0)
ALT: 23 U/L (ref 17–63)
ALT: 22 U/L (ref 17–63)
ANION GAP: 14 (ref 5–15)
AST: 95 U/L — AB (ref 15–41)
AST: 110 U/L — ABNORMAL HIGH (ref 15–41)
Alkaline Phosphatase: 123 U/L (ref 38–126)
Alkaline Phosphatase: 104 U/L (ref 38–126)
Anion gap: 23 — ABNORMAL HIGH (ref 5–15)
BILIRUBIN TOTAL: 0.9 mg/dL (ref 0.3–1.2)
BILIRUBIN TOTAL: 0.8 mg/dL (ref 0.3–1.2)
BUN: 11 mg/dL (ref 6–20)
BUN: 11 mg/dL (ref 6–20)
CO2: 13 mmol/L — AB (ref 22–32)
CO2: 19 mmol/L — ABNORMAL LOW (ref 22–32)
Calcium: 8.7 mg/dL — ABNORMAL LOW (ref 8.9–10.3)
Calcium: 8.3 mg/dL — ABNORMAL LOW (ref 8.9–10.3)
Chloride: 99 mmol/L — ABNORMAL LOW (ref 101–111)
Chloride: 107 mmol/L (ref 101–111)
Creatinine, Ser: 1.67 mg/dL — ABNORMAL HIGH (ref 0.61–1.24)
Creatinine, Ser: 1.11 mg/dL (ref 0.61–1.24)
GFR calc Af Amer: 46 mL/min — ABNORMAL LOW (ref >60)
GFR calc Af Amer: >60 mL/min (ref >60)
GFR calc non Af Amer: 39 mL/min — ABNORMAL LOW (ref >60)
GFR calc non Af Amer: >60 mL/min (ref >60)
GLUCOSE: 123 mg/dL — AB (ref 65–99)
GLUCOSE: 123 mg/dL — AB (ref 65–99)
POTASSIUM: 3.6 mmol/L (ref 3.5–5.1)
POTASSIUM: 2.8 mmol/L — AB (ref 3.5–5.1)
SODIUM: 140 mmol/L (ref 135–145)
Sodium: 135 mmol/L (ref 135–145)
TOTAL PROTEIN: 5.8 g/dL — AB (ref 6.5–8.1)
TOTAL PROTEIN: 5.2 g/dL — AB (ref 6.5–8.1)

## 2017-07-25 LAB — LACTIC ACID, PLASMA
Lactic Acid, Venous: 4.6 mmol/L (ref 0.5–1.9)
Lactic Acid, Venous: 2.2 mmol/L (ref 0.5–1.9)

## 2017-07-25 LAB — CK
CK TOTAL: 3172 U/L — AB (ref 49–397)
Total CK: 4532 U/L — ABNORMAL HIGH (ref 49–397)

## 2017-07-25 LAB — I-STAT CHEM 8, ED
BUN: 11 mg/dL (ref 6–20)
CREATININE: 1.3 mg/dL — AB (ref 0.61–1.24)
Calcium, Ion: 1.09 mmol/L — ABNORMAL LOW (ref 1.15–1.40)
Chloride: 103 mmol/L (ref 101–111)
Glucose, Bld: 120 mg/dL — ABNORMAL HIGH (ref 65–99)
HEMATOCRIT: 29 % — AB (ref 39.0–52.0)
Hemoglobin: 9.9 g/dL — ABNORMAL LOW (ref 13.0–17.0)
Potassium: 3.9 mmol/L (ref 3.5–5.1)
Sodium: 139 mmol/L (ref 135–145)
TCO2: 15 mmol/L — AB (ref 22–32)

## 2017-07-25 LAB — I-STAT CG4 LACTIC ACID, ED: LACTIC ACID, VENOUS: 16.14 mmol/L — AB (ref 0.5–1.9)

## 2017-07-25 LAB — I-STAT ARTERIAL BLOOD GAS, ED
Acid-base deficit: 1 mmol/L (ref 0.0–2.0)
BICARBONATE: 23 mmol/L (ref 20.0–28.0)
O2 Saturation: 95 %
PCO2 ART: 35.4 mmHg (ref 32.0–48.0)
TCO2: 24 mmol/L (ref 22–32)
pH, Arterial: 7.42 (ref 7.350–7.450)
pO2, Arterial: 73 mmHg — ABNORMAL LOW (ref 83.0–108.0)

## 2017-07-25 LAB — PROTIME-INR
INR: 1.29
Prothrombin Time: 16 seconds — ABNORMAL HIGH (ref 11.4–15.2)

## 2017-07-25 LAB — HEPARIN LEVEL (UNFRACTIONATED): Heparin Unfractionated: 0.35 IU/mL (ref 0.30–0.70)

## 2017-07-25 LAB — RAPID URINE DRUG SCREEN, HOSP PERFORMED
Amphetamines: NOT DETECTED
Barbiturates: NOT DETECTED
Benzodiazepines: NOT DETECTED
COCAINE: NOT DETECTED
OPIATES: NOT DETECTED
TETRAHYDROCANNABINOL: NOT DETECTED

## 2017-07-25 LAB — CBC WITH DIFFERENTIAL/PLATELET
Basophils Absolute: 0 10*3/uL (ref 0.0–0.1)
Basophils Relative: 0 %
EOS PCT: 0 %
Eosinophils Absolute: 0 10*3/uL (ref 0.0–0.7)
HCT: 28.6 % — ABNORMAL LOW (ref 39.0–52.0)
HEMOGLOBIN: 8.4 g/dL — AB (ref 13.0–17.0)
LYMPHS PCT: 8 %
Lymphs Abs: 1.5 10*3/uL (ref 0.7–4.0)
MCH: 22.3 pg — ABNORMAL LOW (ref 26.0–34.0)
MCHC: 29.4 g/dL — ABNORMAL LOW (ref 30.0–36.0)
MCV: 76.1 fL — AB (ref 78.0–100.0)
MONO ABS: 1.5 10*3/uL — AB (ref 0.1–1.0)
MONOS PCT: 8 %
NEUTROS PCT: 84 %
Neutro Abs: 15.8 10*3/uL — ABNORMAL HIGH (ref 1.7–7.7)
PLATELETS: 324 10*3/uL (ref 150–400)
RBC: 3.76 MIL/uL — AB (ref 4.22–5.81)
RDW: 20 % — ABNORMAL HIGH (ref 11.5–15.5)
WBC: 18.8 10*3/uL — AB (ref 4.0–10.5)

## 2017-07-25 LAB — MRSA PCR SCREENING: MRSA by PCR: NEGATIVE

## 2017-07-25 LAB — ETHANOL: Alcohol, Ethyl (B): <10 mg/dL (ref <10)

## 2017-07-25 LAB — I-STAT TROPONIN, ED: Troponin i, poc: 0.08 ng/mL (ref 0.00–0.08)

## 2017-07-25 LAB — POC OCCULT BLOOD, ED: Fecal Occult Bld: NEGATIVE

## 2017-07-25 LAB — APTT: aPTT: 33 seconds (ref 24–36)

## 2017-07-25 LAB — ABO/RH: ABO/RH(D): O POS

## 2017-07-25 MED ORDER — FAMOTIDINE IN NACL 20-0.9 MG/50ML-% IV SOLN: 20.0000 mg | Freq: Two times a day (BID) | INTRAVENOUS | Status: DC

## 2017-07-25 MED ORDER — ASPIRIN 81 MG PO TBEC: 325.0000 mg | DELAYED_RELEASE_TABLET | Freq: Every day | ORAL | Status: DC

## 2017-07-25 MED ORDER — THIAMINE HCL 100 MG/ML IJ SOLN
100.0000 mg | Freq: Every day | INTRAMUSCULAR | Status: DC
  Administered 2017-07-25 – 2017-08-03 (×10): 100 mg via INTRAVENOUS
  Filled 2017-07-25 (×10): qty 2

## 2017-07-25 MED ORDER — LORAZEPAM 0.5 MG PO TABS
0.5000 mg | ORAL_TABLET | Freq: Every day | ORAL | Status: DC
  Administered 2017-07-25: 0.5 mg via ORAL
  Filled 2017-07-25: qty 1

## 2017-07-25 MED ORDER — VANCOMYCIN HCL IN DEXTROSE 1-5 GM/200ML-% IV SOLN
1000.0000 mg | INTRAVENOUS | Status: DC
  Administered 2017-07-25: 1000 mg via INTRAVENOUS
  Filled 2017-07-25: qty 200

## 2017-07-25 MED ORDER — ASPIRIN EC 325 MG PO TBEC
325.0000 mg | DELAYED_RELEASE_TABLET | Freq: Every day | ORAL | Status: DC
  Administered 2017-07-26 – 2017-07-28 (×2): 325 mg via ORAL
  Filled 2017-07-25 (×3): qty 1

## 2017-07-25 MED ORDER — SODIUM CHLORIDE 0.9 % IV BOLUS (SEPSIS)
1000.0000 mL | Freq: Once | INTRAVENOUS | Status: AC
  Administered 2017-07-25: 1000 mL via INTRAVENOUS

## 2017-07-25 MED ORDER — POTASSIUM CHLORIDE 10 MEQ/100ML IV SOLN
10.0000 meq | INTRAVENOUS | Status: AC
  Administered 2017-07-25 – 2017-07-26 (×4): 10 meq via INTRAVENOUS
  Filled 2017-07-25 (×4): qty 100

## 2017-07-25 MED ORDER — SODIUM CHLORIDE 0.9 % IV SOLN
Freq: Once | INTRAVENOUS | Status: AC
  Administered 2017-07-25: 14:00:00 via INTRAVENOUS

## 2017-07-25 MED ORDER — HEPARIN BOLUS VIA INFUSION
4000.0000 [IU] | Freq: Once | INTRAVENOUS | Status: AC
  Administered 2017-07-25: 4000 [IU] via INTRAVENOUS
  Filled 2017-07-25: qty 4000

## 2017-07-25 MED ORDER — ORAL CARE MOUTH RINSE
15.0000 mL | Freq: Two times a day (BID) | OROMUCOSAL | Status: DC
  Administered 2017-07-25 – 2017-08-10 (×28): 15 mL via OROMUCOSAL

## 2017-07-25 MED ORDER — LACTATED RINGERS IV SOLN
INTRAVENOUS | Status: DC
  Administered 2017-07-25 – 2017-07-31 (×8): via INTRAVENOUS
  Administered 2017-08-01: 1000 mL via INTRAVENOUS
  Administered 2017-08-01: via INTRAVENOUS

## 2017-07-25 MED ORDER — SODIUM CHLORIDE 0.9 % IV SOLN
250.0000 mL | INTRAVENOUS | Status: DC | PRN
  Administered 2017-07-26 (×2): 250 mL via INTRAVENOUS

## 2017-07-25 MED ORDER — MORPHINE SULFATE (PF) 4 MG/ML IV SOLN
2.0000 mg | INTRAVENOUS | Status: DC | PRN
  Administered 2017-07-25 – 2017-07-29 (×9): 2 mg via INTRAVENOUS
  Filled 2017-07-25 (×10): qty 1

## 2017-07-25 MED ORDER — HEPARIN (PORCINE) IN NACL 100-0.45 UNIT/ML-% IJ SOLN
1000.0000 [IU]/h | INTRAMUSCULAR | Status: DC
  Administered 2017-07-25 – 2017-07-26 (×2): 1000 [IU]/h via INTRAVENOUS
  Filled 2017-07-25 (×2): qty 250

## 2017-07-25 MED ORDER — FAMOTIDINE IN NACL 20-0.9 MG/50ML-% IV SOLN
20.0000 mg | INTRAVENOUS | Status: DC
  Administered 2017-07-25: 20 mg via INTRAVENOUS
  Filled 2017-07-25: qty 50

## 2017-07-25 MED ORDER — ONDANSETRON HCL 4 MG/2ML IJ SOLN
4.0000 mg | Freq: Once | INTRAMUSCULAR | Status: AC
  Administered 2017-07-25: 4 mg via INTRAVENOUS
  Filled 2017-07-25: qty 2

## 2017-07-25 MED ORDER — ASPIRIN 81 MG PO CHEW: 324.0000 mg | CHEWABLE_TABLET | ORAL | Status: AC

## 2017-07-25 MED ORDER — LORAZEPAM 2 MG/ML IJ SOLN
2.0000 mg | INTRAMUSCULAR | Status: DC | PRN
  Administered 2017-07-26 – 2017-07-27 (×6): 2 mg via INTRAVENOUS
  Administered 2017-07-27: 3 mg via INTRAVENOUS
  Administered 2017-07-28 – 2017-07-29 (×4): 2 mg via INTRAVENOUS
  Administered 2017-07-29: 3 mg via INTRAVENOUS
  Administered 2017-07-29 – 2017-07-30 (×7): 2 mg via INTRAVENOUS
  Filled 2017-07-25 (×7): qty 1
  Filled 2017-07-25: qty 2
  Filled 2017-07-25 (×5): qty 1
  Filled 2017-07-25: qty 2
  Filled 2017-07-25 (×6): qty 1

## 2017-07-25 MED ORDER — MORPHINE SULFATE (PF) 4 MG/ML IV SOLN
4.0000 mg | Freq: Once | INTRAVENOUS | Status: AC
  Administered 2017-07-25: 4 mg via INTRAVENOUS
  Filled 2017-07-25: qty 1

## 2017-07-25 MED ORDER — PIPERACILLIN-TAZOBACTAM 3.375 G IVPB 30 MIN
3.3750 g | Freq: Once | INTRAVENOUS | Status: AC
  Administered 2017-07-25: 3.375 g via INTRAVENOUS
  Filled 2017-07-25: qty 50

## 2017-07-25 MED ORDER — PIPERACILLIN-TAZOBACTAM 3.375 G IVPB
3.3750 g | Freq: Three times a day (TID) | INTRAVENOUS | Status: DC
  Administered 2017-07-25 – 2017-07-27 (×5): 3.375 g via INTRAVENOUS
  Filled 2017-07-25 (×6): qty 50

## 2017-07-25 MED ORDER — POTASSIUM CHLORIDE CRYS ER 20 MEQ PO TBCR
40.0000 meq | EXTENDED_RELEASE_TABLET | Freq: Four times a day (QID) | ORAL | Status: AC
  Administered 2017-07-25: 40 meq via ORAL
  Filled 2017-07-25: qty 2

## 2017-07-25 MED ORDER — ASPIRIN 300 MG RE SUPP
300.0000 mg | RECTAL | Status: AC
  Administered 2017-07-25: 300 mg via RECTAL
  Filled 2017-07-25: qty 1

## 2017-07-25 MED ORDER — FOLIC ACID 5 MG/ML IJ SOLN
1.0000 mg | Freq: Every day | INTRAMUSCULAR | Status: DC
  Administered 2017-07-25 – 2017-07-28 (×4): 1 mg via INTRAVENOUS
  Filled 2017-07-25 (×5): qty 0.2

## 2017-07-25 NOTE — ED Notes (Signed)
Report attempted x2, unable to give report.

## 2017-07-25 NOTE — ED Notes (Signed)
critical care NP at bedside.

## 2017-07-25 NOTE — Progress Notes (Signed)
ANTICOAGULATION CONSULT NOTE - Initial Consult  Pharmacy Consult for heparin Indication: chest pain/ACS and atrial fibrillation  No Known Allergies  Patient Measurements: Height: 5\' 7"  (170.2 cm) Weight: 145 lb 15.1 oz (66.2 kg) IBW/kg (Calculated) : 66.1 Heparin Dosing Weight: 66.2kg  Vital Signs: Temp: 97.7 F (36.5 C) (10/13 1615) Temp Source: Core (Comment) (10/13 1326) BP: 136/79 (10/13 1615) Pulse Rate: 45 (10/13 1615)  Labs:  Recent Labs  07/25/17 1215  HGB 8.4*  HCT 28.6*  PLT 324  APTT 33  LABPROT 16.0*  INR 1.29  CREATININE 1.67*  CKTOTAL 3,172*    Estimated Creatinine Clearance: 37.4 mL/min (A) (by C-G formula based on SCr of 1.67 mg/dL (H)).  Medications:  Infusions:  . sodium chloride    . famotidine (PEPCID) IV    . heparin 1,000 Units/hr (07/25/17 1405)  . lactated ringers    . piperacillin-tazobactam    . piperacillin-tazobactam (ZOSYN)  IV    . vancomycin      Assessment: 1 yom presented to the ED after being found down. Initially started on IV heparin by ED provider due to concerns for MI and afib. Baseline H/H is low and platelets are WNL. He is not on anticoagulation PTA.   Goal of Therapy:  Heparin level 0.3-0.7 units/ml Monitor platelets by anticoagulation protocol: Yes   Plan:  Continue heparin gtt 1000 units/hr as ordered by EDP Check an 8 hr heparin level Daily heparin level and CBC  David Lucero, Rande Lawman 07/25/2017,4:21 PM

## 2017-07-25 NOTE — Progress Notes (Signed)
[facility] for heparin Indication: chest pain/ACS and atrial fibrillation  No Known Allergies  Patient Measurements: Height: 5\' 7"  (170.2 cm) Weight: 129 lb 3 oz (58.6 kg) IBW/kg (Calculated) : 66.1 Heparin Dosing Weight: 66.2kg  Vital Signs: Temp: 99 F (37.2 C) (10/13 2300) Temp Source: Core (Comment) (10/13 2000) BP: 162/126 (10/13 2300) Pulse Rate: 97 (10/13 1800)  Labs:  Recent Labs  07/25/17 1215 07/25/17 1237 07/25/17 1640 07/25/17 1800 07/25/17 2223  HGB 8.4* 9.9*  --   --   --   HCT 28.6* 29.0*  --   --   --   PLT 324  --   --   --   --   APTT 33  --   --   --   --   LABPROT 16.0*  --   --   --   --   INR 1.29  --   --   --   --   HEPARINUNFRC  --   --   --   --  0.35  CREATININE 1.67* 1.30* 1.11  --   --   CKTOTAL 3,172*  --   --  4,532*  --     Estimated Creatinine Clearance: 49.9 mL/min (by C-G formula based on SCr of 1.11 mg/dL).  Assessment: .71 y.o. male with Afib/MI for heparin   Goal of Therapy:  Heparin level 0.3-0.7 units/ml Monitor platelets by anticoagulation protocol: Yes   Plan:  Continue Heparin at current rate  Follow-up am labs.   Latoya Maulding, Bronson Curb 07/25/2017,11:43 PM

## 2017-07-25 NOTE — ED Notes (Signed)
Hot line with fluids d/c'ed due to temp 98.6 core.

## 2017-07-25 NOTE — H&P (Signed)
PULMONARY / CRITICAL CARE MEDICINE   Name: David Lucero MRN: [id number] DOB: 03-13-1946    ADMISSION DATE:  07/25/2017 CONSULTATION DATE:  10/13  REFERRING MD:  Maryan Rued  CHIEF COMPLAINT:  Delirium, and metabolic acidosis  HISTORY OF PRESENT ILLNESS:   71 year old male patient with multiple comorbidities including: Anxiety, alcohol abuse, depression, lung cancer, chronic obstructive pulmonary disease. EMS was called the a.m. of 10/13 after the patient was found lying on the floor of his home by a friend. The house was cold. He had last been seen normal on 10/12 around noon. On EMS arrival he was noted to have ST elevation with left upper extremity trauma. He was found to be hypothermic and tremulous, he was transferred to [facility] emergency room ER evaluation: In ER complained of left arm pain, denied chest discomfort or shortness of breath. 12-lead showed atrial fibrillation, ST depression in V1 through V3, also concerns of nonspecific intraventricular conduction delay His initial temperature was 87.9 Fahrenheit, he was bradycardic, and had rapid respiratory rate. His initial lactic acid was 16, hemoglobin 8. A chest x-ray demonstrated fracture of the proximal left humerus. His troponin was mildly elevated. He was treated with warmed IV fluids, arm was immobilized, seen by Dr. Ellyn Hack who felt EKG was consistent with inferior MI. He recommended IV heparin, and supportive care for medical issues prior to further cardiac intervention. Given his degree of metabolic disarray critical care was asked to admit.  PAST MEDICAL HISTORY :  He  has a past medical history of Acid reflux; Anxiety; Cancer [facility]); COPD (chronic obstructive pulmonary disease) ([place]); ETOH abuse; H/O ETOH abuse; Lung cancer ([place]); PAD (peripheral artery disease) ([place]); and PTSD (post-traumatic stress disorder).  PAST SURGICAL HISTORY: He  has no past surgical history on file.  No Known Allergies  No current  facility-administered medications on file prior to encounter.    Current Outpatient Prescriptions on File Prior to Encounter  Medication Sig  . acetaminophen (TYLENOL) 500 MG tablet Take 1,000 mg by mouth every 6 (six) hours as needed for moderate pain.   Marland Kitchen LORazepam (ATIVAN) 0.5 MG tablet Take 1 tablet (0.5 mg total) by mouth at bedtime. (Patient taking differently: Take 0.5 mg by mouth daily as needed for anxiety. )  . LORazepam (ATIVAN) 0.5 MG tablet Take 1 tablet (0.5 mg total) by mouth 2 (two) times daily. (Patient not taking: Reported on 03/28/2017)    FAMILY HISTORY:  His indicated that his mother is alive. He indicated that his father is deceased.    SOCIAL HISTORY: He  reports that he has been smoking Cigarettes.  He has a 30.00 pack-year smoking history. He has never used smokeless tobacco. He reports that he drinks about 50.4 oz of alcohol per week . He reports that he does not use drugs.  REVIEW OF SYSTEMS:   Gen. feels cold, has had significant weight loss over the last year, no appetite. HEENT: Does have lightheadedness, no nasal discharge no headache, no sore throat pulmonary denies shortness of breath, cough, wheezing, chest discomfort. Cardiac: Does have lightheadedness from time to time. Has had intermittent chest discomfort with ambulation over the last year, but nothing specific. He denies palpitations. He desires orthopnea. GI: Poor appetite. Primarily drinks alcohol, does not eat much food. Weight loss as noted above. No nausea vomiting or diarrhea. GU: No urinary urgency hesitancy, or odor. No lightheadedness as mentioned above. Some memory loss, some anxiety. Endocrine no hot or cold intolerance no weight changes except what mentioned above.  SUBJECTIVE:  Complains of left shoulder pain  VITAL SIGNS: BP (!) 132/117   Pulse (!) 49   Temp (!) 94.6 F (34.8 C)   Resp (!) 27   SpO2 100%   HEMODYNAMICS:    VENTILATOR SETTINGS:    INTAKE / OUTPUT: No  intake/output data recorded.  PHYSICAL EXAMINATION: General:  Social 71 year old male patient currently in no acute distress Neuro:  Awake oriented, speech a little slurred however he is endentulous, his left arm is immobilized. HEENT:  Normocephalic/atraumatic no JVD Cardiovascular:  Bradycardic irregularly irregular no audible murmur Lungs:  Clear, no accessory muscle use Abdomen:  Soft nontender no organomegaly. Positive bowel sounds Musculoskeletal:  Left shoulder/arm immobilized pain to movement noted Skin:  Abrasions and scabbed areas to both knees no edema skin is cool but no longer mottled  LABS:  BMET  Recent Labs Lab 07/25/17 1215  NA 135  K 3.6  CL 99*  CO2 13*  BUN 11  CREATININE 1.67*  GLUCOSE 123*    Electrolytes  Recent Labs Lab 07/25/17 1215  CALCIUM 8.7*    CBC  Recent Labs Lab 07/25/17 1215  WBC 18.8*  HGB 8.4*  HCT 28.6*  PLT 324    Coag's  Recent Labs Lab 07/25/17 1215  APTT 33  INR 1.29    Sepsis Markers No results for input(s): LATICACIDVEN, PROCALCITON, O2SATVEN in the last 168 hours.  ABG No results for input(s): PHART, PCO2ART, PO2ART in the last 168 hours.  Liver Enzymes  Recent Labs Lab 07/25/17 1215  AST 95*  ALT 23  ALKPHOS 123  BILITOT 0.9  ALBUMIN 2.7*    Cardiac Enzymes No results for input(s): TROPONINI, PROBNP in the last 168 hours.  Glucose No results for input(s): GLUCAP in the last 168 hours.  Imaging Ct Head Wo Contrast  Result Date: 07/25/2017 CLINICAL DATA:  Found down, possible STEMI EXAM: CT HEAD WITHOUT CONTRAST TECHNIQUE: Contiguous axial images were obtained from the base of the skull through the vertex without intravenous contrast. COMPARISON:  04/30/2014 FINDINGS: Brain: No evidence of acute infarction, hemorrhage, hydrocephalus, extra-axial collection or mass lesion/mass effect. Subcortical white matter and periventricular small vessel ischemic changes. Chronic right basal ganglia  lacunar infarct. Bilateral basal ganglia calcifications. Vascular: Intracranial atherosclerosis. Skull: Normal. Negative for fracture or focal lesion. Sinuses/Orbits: The visualized paranasal sinuses are essentially clear. The mastoid air cells are unopacified. Other: None. IMPRESSION: No evidence of acute intracranial abnormality. Small vessel ischemic changes. Chronic right basal ganglia lacunar infarct. Electronically Signed   By: Julian Hy M.D.   On: 07/25/2017 13:25   Dg Chest Port 1 View  Result Date: 07/25/2017 CLINICAL DATA:  Found lying on the floor and hypothermic. EXAM: PORTABLE CHEST 1 VIEW COMPARISON:  08/21/2015 FINDINGS: Cardiomediastinal silhouette is normal. Mediastinal contours appear intact. Tortuosity and calcific atherosclerotic disease of the aorta. There is no evidence of focal airspace consolidation, pleural effusion or pneumothorax. Osseous structures are without acute abnormality. Soft tissues are grossly normal. IMPRESSION: No active disease. Electronically Signed   By: Fidela Salisbury M.D.   On: 07/25/2017 12:49   Dg Humerus Left  Result Date: 07/25/2017 CLINICAL DATA:  Left upper arm deformity after fall yesterday. EXAM: LEFT HUMERUS - 2+ VIEW COMPARISON:  None. FINDINGS: Moderately displaced and comminuted fracture is seen involving the proximal left humeral head and neck. Old distal left clavicular fracture is noted as well. IMPRESSION: Moderately displaced and comminuted proximal left humeral head and neck fracture is noted. Electronically Signed  By: Marijo Conception, M.D.   On: 07/25/2017 12:51     STUDIES:  CT head 10/13: No acute changes, chronic right basal ganglial infarct Left shoulder film 10/13:Acute, comminuted fracture of the surgical neck of the left humerus  CULTURES: Blood cultures 2 10/13>>> UC 10/13>>>  ANTIBIOTICS:   SIGNIFICANT EVENTS:   LINES/TUBES:   DISCUSSION: 71 year old white male with a significant history of  alcohol abuse. He lives alone. Also has remote history of laryngeal and lung cancer. He fell at home after becoming lightheaded. He's had prior episodes of lightheadedness in the past, fell on once before. He was found by his friend this a.m. on 10/13 confused, and on the floor. He had a left humerus fracture, and ST elevation inferior MI. Further evaluation demonstrates rhabdomyolysis, acute kidney injury, anemia, and tachypnea. He is clinically improved with warmed IV fluid hydration and supportive care. For now we will admit him to the intensive care, continue IV hydration, continue bear hugger, continue heparin infusion, continue arm immobilization, and repeat chemistries and CBC. He will eventually need ischemia evaluation as well as surgical correction of the left upper extremity. At this point focus will be on normalizing his metabolic derangements. ASSESSMENT / PLAN:  PULMONARY A: Tachypnea in the setting of metabolic acidosis Chest x-ray personally reviewed: No active cardiopulmonary disease noted. P:   Pulse oximetry Treatment of acidosis Oxygen as indicated Follow-up chest x-ray a.m.  CARDIOVASCULAR A:  Acute inferior ST elevation myocardial infarction Bradycardia Atrial fibrillation  On initial presentation prior warming had marked inferior ST elevation this improved with hydration and warming P:  IV heparin Continue telemetry Daily aspirin Gentle hydration Echo  RENAL A:   Severe lactic acidosis Acute kidney injury Probable rhabdomyolysis  P:   Aggressive IV hydration, serial chemistries Check urine myoglobin Strict I&O Heart check UA  GASTROINTESTINAL A:   Severe malnutrition in setting of alcohol abuse P:   Nutritional consult  HEMATOLOGIC A:   Anemia of chronic disease History of laryngeal and lung cancer, records not currently available. Apparently treated 10 years ago  P:  Serial CBC Transfuse per protocol  INFECTIOUS A:   Systemic inflammatory  response, no current clear source for infection  P:   Check blood and urine culture Cycle pro-calcitonin's Empiric vancomycin and Zosyn for now, low threshold to discontinue if pro-calcitonin negative and infection markers negative  ENDOCRINE A:   Hyperglycemia P:   Trend CBG  NEUROLOGIC A:   Acute delirium: This is improved with hydration and warming History of alcohol abuse at high risk for DTs P:   CIWA protocol Supportive care Thiamine and folate  Musculoskeletal  A: Left proximal humerus fracture Plan Keep immobilized, Orthopedics has seen, reevaluate when hemodynamically stable   FAMILY  - Updates:  My critical care time is 40 minutes. - Inter-disciplinary family meet or Palliative Care meeting due by: 10/20 Erick Colace ACNP-BC [place] Pager # [phone] OR # [phone] if no answer   07/25/2017, 3:11 PM

## 2017-07-25 NOTE — ED Provider Notes (Signed)
[place] DEPT Provider Note   CSN: [id number] Arrival date & time: 07/25/17  1158     History   Chief Complaint Chief Complaint  Patient presents with  . Altered Mental Status  . Cold Exposure    HPI David Lucero is a 71 y.o. male.  Patient is a 71 year old male with a history of lung cancer, COPD, alcohol abuse, acid reflux presenting today by EMS with complaint of being found down at his home. Per the people at the scene patient lives alone but a neighbor checks on him daily. Patient was his normal self around noon yesterday and when she went to check on him today he was lying in the floor of his home unable to get up. Patient was confused and not acting himself. Patient states he fell around 1 AM and was unable to get up. He is complaining of pain in his left arm. When EMS did an EKG on the scene there was concern for MI. Patient is currently denying any chest pain or shortness of breath. Patient also found to be cold in the house was very cool when they arrived.   The history is provided by the patient and the EMS personnel.  Altered Mental Status   This is a new problem. Associated symptoms include confusion and weakness. His past medical history is significant for COPD. Past medical history comments: lung cancer, alcohol abuse.    Past Medical History:  Diagnosis Date  . Acid reflux    from throat cancer  . Anxiety   . Cancer ([place])   . COPD (chronic obstructive pulmonary disease) ([place])   . ETOH abuse   . H/O ETOH abuse   . Lung cancer ([place])   . PAD (peripheral artery disease) ([place])   . PTSD (post-traumatic stress disorder)     Patient Active Problem List   Diagnosis Date Noted  . PAD (peripheral artery disease) ([place]) 02/14/2014  . Chest pain 11/03/2013  . UNSPECIFIED HYPOTHYROIDISM 09/24/2008  . MORBID OBESITY 09/24/2008  . Alcohol abuse 09/24/2008  . PTSD 09/24/2008  . COPD 09/24/2008    History reviewed. No pertinent surgical  history.     Home Medications    Prior to Admission medications   Medication Sig Start Date End Date Taking? Authorizing Provider  acetaminophen (TYLENOL) 500 MG tablet Take 1,000 mg by mouth every 6 (six) hours as needed for moderate pain. For pain    [provider]  LORazepam (ATIVAN) 0.5 MG tablet Take 1 tablet (0.5 mg total) by mouth 2 (two) times daily. Patient not taking: Reported on 03/28/2017 01/10/17   Ripley Fraise, MD  LORazepam (ATIVAN) 0.5 MG tablet Take 1 tablet (0.5 mg total) by mouth at bedtime. Patient taking differently: Take 0.5 mg by mouth daily.  03/18/17   Orpah Greek, MD    Family History Family History  Problem Relation Age of Onset  . Heart attack Father     Social History Social History  Substance Use Topics  . Smoking status: Current Every Day Smoker    Packs/day: 1.00    Years: 30.00    Types: Cigarettes  . Smokeless tobacco: Never Used  . Alcohol use 50.4 oz/week    84 Cans of beer per week     Comment: 12 or more beers/day     Allergies   Patient has no known allergies.   Review of Systems Review of Systems  Neurological: Positive for weakness.  Psychiatric/Behavioral: Positive for confusion.  All other systems  reviewed and are negative.    Physical Exam Updated Vital Signs BP (!) 137/100 (BP Location: Right Arm)   Pulse 100   Temp (!) 87.9 F (31.1 C) (Rectal)   Resp (!) 29   SpO2 100%   Physical Exam  Constitutional: He appears well-developed and well-nourished. He appears distressed.  Shivering and appears to be in pain  HENT:  Head: Normocephalic and atraumatic.  Mouth/Throat: Oropharynx is clear and moist.  Eyes: Pupils are equal, round, and reactive to light. Conjunctivae and EOM are normal.  Neck: Normal range of motion. Neck supple.  Cardiovascular: Normal rate, regular rhythm and intact distal pulses.   Occasional extrasystoles are present.  No murmur heard. Patient is shivering constantly.No  audible murmurs.  Intermittent extra beats  Pulmonary/Chest: Effort normal. Tachypnea noted. No respiratory distress. He has decreased breath sounds. He has no wheezes. He has no rales.  Abdominal: Soft. He exhibits no distension. There is no tenderness. There is no rebound and no guarding.  Musculoskeletal: Normal range of motion. He exhibits no edema.       Left shoulder: He exhibits tenderness, deformity and pain. He exhibits normal pulse.  Neurological: He is alert.  Oriented to person and place. Able to move upper and lower extremities however guarded moving the left arm related to pain.  Skin: Skin is dry. No rash noted. No erythema.  Cool to the touch.  Multiple areas of skin bruising with some minimal skin avulsion over the bilateral upper and lower extremities  Psychiatric: He has a normal mood and affect. His behavior is normal.  Nursing note and vitals reviewed.    ED Treatments / Results  Labs (all labs ordered are listed, but only abnormal results are displayed) Labs Reviewed  CBC WITH DIFFERENTIAL/PLATELET - Abnormal; Notable for the following:       Result Value   WBC 18.8 (*)    RBC 3.76 (*)    Hemoglobin 8.4 (*)    HCT 28.6 (*)    MCV 76.1 (*)    MCH 22.3 (*)    MCHC 29.4 (*)    RDW 20.0 (*)    Neutro Abs 15.8 (*)    Monocytes Absolute 1.5 (*)    All other components within normal limits  COMPREHENSIVE METABOLIC PANEL - Abnormal; Notable for the following:    Chloride 99 (*)    CO2 13 (*)    Glucose, Bld 123 (*)    Creatinine, Ser 1.67 (*)    Calcium 8.7 (*)    Total Protein 5.8 (*)    Albumin 2.7 (*)    AST 95 (*)    GFR calc non Af Amer 39 (*)    GFR calc Af Amer 46 (*)    Anion gap 23 (*)    All other components within normal limits  PROTIME-INR - Abnormal; Notable for the following:    Prothrombin Time 16.0 (*)    All other components within normal limits  CK - Abnormal; Notable for the following:    Total CK 3,172 (*)    All other components  within normal limits  APTT  ETHANOL  URINALYSIS, ROUTINE W REFLEX MICROSCOPIC  I-STAT TROPONIN, ED  I-STAT CG4 LACTIC ACID, ED  POC OCCULT BLOOD, ED  TYPE AND SCREEN  ABO/RH    EKG  EKG Interpretation  Date/Time:  Saturday July 25 2017 12:10:27 EDT Ventricular Rate:  128 PR Interval:    QRS Duration: 133 QT Interval:  382 QTC Calculation: 449 R  Axis:   65 Text Interpretation:  Atrial fibrillation Nonspecific intraventricular conduction delay Inferior infarct, acute (LCx) ST depression V1-V3, suggest recording posterior leads Baseline wander in lead(s) I II aVR aVL severe artifact with difficulty interpretting Confirmed by Blanchie Dessert [phone]) on 07/25/2017 12:17:32 PM       Radiology Ct Head Wo Contrast  Result Date: 07/25/2017 CLINICAL DATA:  Found down, possible STEMI EXAM: CT HEAD WITHOUT CONTRAST TECHNIQUE: Contiguous axial images were obtained from the base of the skull through the vertex without intravenous contrast. COMPARISON:  04/30/2014 FINDINGS: Brain: No evidence of acute infarction, hemorrhage, hydrocephalus, extra-axial collection or mass lesion/mass effect. Subcortical white matter and periventricular small vessel ischemic changes. Chronic right basal ganglia lacunar infarct. Bilateral basal ganglia calcifications. Vascular: Intracranial atherosclerosis. Skull: Normal. Negative for fracture or focal lesion. Sinuses/Orbits: The visualized paranasal sinuses are essentially clear. The mastoid air cells are unopacified. Other: None. IMPRESSION: No evidence of acute intracranial abnormality. Small vessel ischemic changes. Chronic right basal ganglia lacunar infarct. Electronically Signed   By: Julian Hy M.D.   On: 07/25/2017 13:25   Dg Chest Port 1 View  Result Date: 07/25/2017 CLINICAL DATA:  Found lying on the floor and hypothermic. EXAM: PORTABLE CHEST 1 VIEW COMPARISON:  08/21/2015 FINDINGS: Cardiomediastinal silhouette is normal. Mediastinal contours  appear intact. Tortuosity and calcific atherosclerotic disease of the aorta. There is no evidence of focal airspace consolidation, pleural effusion or pneumothorax. Osseous structures are without acute abnormality. Soft tissues are grossly normal. IMPRESSION: No active disease. Electronically Signed   By: Fidela Salisbury M.D.   On: 07/25/2017 12:49   Dg Humerus Left  Result Date: 07/25/2017 CLINICAL DATA:  Left upper arm deformity after fall yesterday. EXAM: LEFT HUMERUS - 2+ VIEW COMPARISON:  None. FINDINGS: Moderately displaced and comminuted fracture is seen involving the proximal left humeral head and neck. Old distal left clavicular fracture is noted as well. IMPRESSION: Moderately displaced and comminuted proximal left humeral head and neck fracture is noted. Electronically Signed   By: Marijo Conception, M.D.   On: 07/25/2017 12:51    Procedures Procedures (including critical care time)  Medications Ordered in ED Medications  heparin bolus via infusion 4,000 Units (not administered)  heparin ADULT infusion 100 units/mL (25000 units/247mL sodium chloride 0.45%) (not administered)  sodium chloride 0.9 % bolus 1,000 mL (0 mLs Intravenous Stopped 07/25/17 1247)  sodium chloride 0.9 % bolus 1,000 mL (0 mLs Intravenous Stopped 07/25/17 1328)  0.9 %  sodium chloride infusion ( Intravenous New Bag/Given 07/25/17 1331)     Initial Impression / Assessment and Plan / ED Course  I have reviewed the triage vital signs and the nursing notes.  Pertinent labs & imaging results that were available during my care of the patient were reviewed by me and considered in my medical decision making (see chart for details).     Elderly male presenting from home after being found down. Patient has cold with a temperature of 87.9, generalized shivering, EKG done by EMS that is concerning for MI.   On arrival patient is hypothermic and shivering. However he is awake alert and oriented. He is responding to  questions and pain. He has evidence of deformity to the left shoulder with concern for injury. Patient states he fell at 1 AM. He is denying any chest pain or shortness of breath but is tachypnea with decreased breath sounds. Patient has a known history of alcohol abuse and cachectic. Patient is also extremely pale and concern for  possible anemia. Rectal exam with no stool present at this time but guaiac pending.  Significant irritability on monitor but visiblereads a heart rate in the 70s to 80s with occasional bradycardia to the 50s.  CBC, CMP, lactate, alcohol, troponin, chest x-ray, humerus films, CK, UA pending. At this time lower suspicion for sepsis and feel most likely environmental. Blood sugar is within normal limits. Dr. Ellyn Hack with cardiology is aware of patient's EKG and his symptoms. However at this time patient would benefit from medical management warming and repeat EKG.  We'll contact cardiology when patient is more stable. Also possibility that patient hit his head. When patient is more stable will send for CT of his head.  1:31 PM n reevaluation patient's temperature is improving to 90by temp Foley. EKG changes are still consistent with inferior MI. Spoke with Dr. Ellyn Hack who at this point recommends IV heparin but given patient's other acute issues not stable for cath. Patient's lactic acid is 16, hemoglobin of 8 with Hemoccult negative stool. Head CT without acute bleed, chest x-ray is clear and humerus film shows a fracture of the proximal humerus.  Initial troponin is 0.08. CBC with leukocytosis of 18,000and anemia. CMP, UA pending. INR within normal limits.  1:40 PM Pt started on heparin gtt.  Will admit to critical care  CRITICAL CARE Performed by: Blanchie Dessert Total critical care time: 45 minutes Critical care time was exclusive of separately billable procedures and treating other patients. Critical care was necessary to treat or prevent imminent or life-threatening  deterioration. Critical care was time spent personally by me on the following activities: development of treatment plan with patient and/or surrogate as well as nursing, discussions with consultants, evaluation of patient's response to treatment, examination of patient, obtaining history from patient or surrogate, ordering and performing treatments and interventions, ordering and review of laboratory studies, ordering and review of radiographic studies, pulse oximetry and re-evaluation of patient's condition.   Final Clinical Impressions(s) / ED Diagnoses   Final diagnoses:  AKI (acute kidney injury) ([place])  Inferior MI ([place])  Hypothermia, initial encounter  Lactic acidosis  Anemia, unspecified type  Other closed displaced fracture of proximal end of left humerus, initial encounter    New Prescriptions New Prescriptions   No medications on file     Blanchie Dessert, MD 07/25/17 1408

## 2017-07-25 NOTE — ED Notes (Signed)
WARM FLUIDS HANGING AND WARMING BLANKET APPLIED.

## 2017-07-25 NOTE — ED Notes (Signed)
bair hugger d/c'ed warm blankets applied.

## 2017-07-25 NOTE — Progress Notes (Signed)
[place] Progress Note Patient Name: David Lucero DOB: Mar 09, 1946 MRN: [id number]   Date of Service  07/25/2017  HPI/Events of Note  k 2.8 Poor uop  eICU Interventions  Replaced.  40 meq kcl over 4 hrs     Intervention Category Minor Interventions: Electrolytes abnormality - evaluation and management  Mauri Brooklyn, P 07/25/2017, 8:11 PM

## 2017-07-25 NOTE — ED Notes (Signed)
Report given to [facility] RN, pt transported by swat team.

## 2017-07-25 NOTE — Progress Notes (Signed)
Pharmacy Antibiotic Note  David Lucero is a 71 y.o. male admitted on 07/25/2017 with sepsis.  Pharmacy has been consulted for vancomycin and zosyn dosing. Pt is hypothermic and WBC is elevated at 18.8. SCr is elevated at 1.67.   Plan: Vancomycin 1gm IV Q24H Zosyn 3.375gm IV Q8H (4 hr inf) F/u renal fxn, C&S, clinical status and trough at SS  Height: 5\' 7"  (170.2 cm) Weight: 145 lb 15.1 oz (66.2 kg) IBW/kg (Calculated) : 66.1  Temp (24hrs), Avg:93.9 F (34.4 C), Min:87.9 F (31.1 C), Max:97.7 F (36.5 C)   Recent Labs Lab 07/25/17 1215  WBC 18.8*  CREATININE 1.67*    Estimated Creatinine Clearance: 37.4 mL/min (A) (by C-G formula based on SCr of 1.67 mg/dL (H)).    No Known Allergies  Antimicrobials this admission: Vanc 10/13>> Zosyn 10/13>>  Dose adjustments this admission: N/A  Microbiology results: Pending  Thank you for allowing pharmacy to be a part of this patient's care.  Bethanne Mule, Rande Lawman 07/25/2017 4:20 PM

## 2017-07-25 NOTE — Consult Note (Signed)
David Lucero is an 71 y.o. male.    Chief Complaint: left shoulder pain s/p fall  HPI: 71 y/o male with recent history of a fall onto his left side last night. Upon admission abnormal EKG demonstrated acute MI. X-rays show comminuted and displaced left proximal humerus fracture. Pt currently denies much pain in his sling. Denies previous problems with the left shoulder in the past. Pt is right hand dominant. Denies any other injuries and does not remember falling.   PCP:  [name], MD  PMH: Past Medical History:  Diagnosis Date  . Acid reflux    from throat cancer  . Anxiety   . Cancer ([place])   . COPD (chronic obstructive pulmonary disease) ([place])   . ETOH abuse   . H/O ETOH abuse   . Lung cancer ([place])   . PAD (peripheral artery disease) ([place])   . PTSD (post-traumatic stress disorder)     PSH: History reviewed. No pertinent surgical history.  Social History:  reports that he has been smoking Cigarettes.  He has a 30.00 pack-year smoking history. He has never used smokeless tobacco. He reports that he drinks about 50.4 oz of alcohol per week . He reports that he does not use drugs.  Allergies:  No Known Allergies  Medications: Current Facility-Administered Medications  Medication Dose Route Frequency Provider Last Rate Last Dose  . 0.9 %  sodium chloride infusion  250 mL Intravenous PRN Erick Colace, NP      . Derrill Memo ON 07/26/2017] aspirin EC tablet 325 mg  325 mg Oral Daily Mannam, Praveen, MD      . famotidine (PEPCID) IVPB 20 mg premix  20 mg Intravenous Q24H Rumbarger, Rachel L, RPH 100 mL/hr at 07/25/17 1828 20 mg at 07/25/17 1828  . folic acid injection 1 mg  1 mg Intravenous Daily Erick Colace, NP   1 mg at 07/25/17 2021  . heparin ADULT infusion 100 units/mL (25000 units/227m sodium chloride 0.45%)  1,000 Units/hr Intravenous Continuous PBlanchie Dessert MD 10 mL/hr at 07/25/17 1800 1,000 Units/hr at 07/25/17 1800  . lactated ringers infusion    Intravenous Continuous BErick Colace NP 125 mL/hr at 07/25/17 1800    . LORazepam (ATIVAN) injection 2-3 mg  2-3 mg Intravenous Q1H PRN BErick Colace NP      . LORazepam (ATIVAN) tablet 0.5 mg  0.5 mg Oral QHS BErick Colace NP      . MEDLINE mouth rinse  15 mL Mouth Rinse BID Mannam, Praveen, MD      . morphine 4 MG/ML injection 2 mg  2 mg Intravenous Q1H PRN BErick Colace NP   2 mg at 07/25/17 2037  . piperacillin-tazobactam (ZOSYN) IVPB 3.375 g  3.375 g Intravenous Q8H Rumbarger, RValeda Malm RPH      . potassium chloride 10 mEq in 100 mL IVPB  10 mEq Intravenous Q1 Hr x 4 SMauri Brooklyn MD 100 mL/hr at 07/25/17 2148 10 mEq at 07/25/17 2148  . potassium chloride SA (K-DUR,KLOR-CON) CR tablet 40 mEq  40 mEq Oral Q6H Mannam, Praveen, MD   40 mEq at 07/25/17 2037  . thiamine (B-1) injection 100 mg  100 mg Intravenous Daily BErick Colace NP   100 mg at 07/25/17 1629  . vancomycin (VANCOCIN) IVPB 1000 mg/200 mL premix  1,000 mg Intravenous Q24H Rumbarger, [facility]eda Malm [facility]  Stopped at 07/25/17 1731    Results for orders placed or performed during the hospital encounter  of 07/25/17 (from the past 48 hour(s))  CBC with Differential/Platelet     Status: Abnormal   Collection Time: 07/25/17 12:15 PM  Result Value Ref Range   WBC 18.8 (H) 4.0 - 10.5 K/uL   RBC 3.76 (L) 4.22 - 5.81 MIL/uL   Hemoglobin 8.4 (L) 13.0 - 17.0 g/dL   HCT 28.6 (L) 39.0 - 52.0 %   MCV 76.1 (L) 78.0 - 100.0 fL   MCH 22.3 (L) 26.0 - 34.0 pg   MCHC 29.4 (L) 30.0 - 36.0 g/dL   RDW 20.0 (H) 11.5 - 15.5 %   Platelets 324 150 - 400 K/uL   Neutrophils Relative % 84 %   Lymphocytes Relative 8 %   Monocytes Relative 8 %   Eosinophils Relative 0 %   Basophils Relative 0 %   Neutro Abs 15.8 (H) 1.7 - 7.7 K/uL   Lymphs Abs 1.5 0.7 - 4.0 K/uL   Monocytes Absolute 1.5 (H) 0.1 - 1.0 K/uL   Eosinophils Absolute 0.0 0.0 - 0.7 K/uL   Basophils Absolute 0.0 0.0 - 0.1 K/uL   RBC Morphology ELLIPTOCYTES     Comment:  TEARDROP CELLS POLYCHROMASIA PRESENT   Comprehensive metabolic panel     Status: Abnormal   Collection Time: 07/25/17 12:15 PM  Result Value Ref Range   Sodium 135 135 - 145 mmol/L   Potassium 3.6 3.5 - 5.1 mmol/L   Chloride 99 (L) 101 - 111 mmol/L   CO2 13 (L) 22 - 32 mmol/L   Glucose, Bld 123 (H) 65 - 99 mg/dL   BUN 11 6 - 20 mg/dL   Creatinine, Ser 1.67 (H) 0.61 - 1.24 mg/dL   Calcium 8.7 (L) 8.9 - 10.3 mg/dL   Total Protein 5.8 (L) 6.5 - 8.1 g/dL   Albumin 2.7 (L) 3.5 - 5.0 g/dL   AST 95 (H) 15 - 41 U/L   ALT 23 17 - 63 U/L    Comment: RESULTS CONFIRMED BY MANUAL DILUTION   Alkaline Phosphatase 123 38 - 126 U/L   Total Bilirubin 0.9 0.3 - 1.2 mg/dL   GFR calc non Af Amer 39 (L) >60 mL/min   GFR calc Af Amer 46 (L) >60 mL/min    Comment: (NOTE) The eGFR has been calculated using the CKD EPI equation. This calculation has not been validated in all clinical situations. eGFR's persistently <60 mL/min signify possible Chronic Kidney Disease.    Anion gap 23 (H) 5 - 15    Comment: REPEATED TO VERIFY  Protime-INR     Status: Abnormal   Collection Time: 07/25/17 12:15 PM  Result Value Ref Range   Prothrombin Time 16.0 (H) 11.4 - 15.2 seconds   INR 1.29   APTT     Status: None   Collection Time: 07/25/17 12:15 PM  Result Value Ref Range   aPTT 33 24 - 36 seconds  Ethanol     Status: None   Collection Time: 07/25/17 12:15 PM  Result Value Ref Range   Alcohol, Ethyl (B) <10 <10 mg/dL    Comment:        LOWEST DETECTABLE LIMIT FOR SERUM ALCOHOL IS 10 mg/dL FOR MEDICAL PURPOSES ONLY   Type and screen     Status: None   Collection Time: 07/25/17 12:15 PM  Result Value Ref Range   ABO/RH(D) O POS    Antibody Screen NEG    Sample Expiration 07/28/2017   CK     Status: Abnormal   Collection Time: 07/25/17  12:15 PM  Result Value Ref Range   Total CK 3,172 (H) 49 - 397 U/L  ABO/Rh     Status: None   Collection Time: 07/25/17 12:15 PM  Result Value Ref Range   ABO/RH(D)  O POS   I-stat troponin, ED     Status: None   Collection Time: 07/25/17 12:31 PM  Result Value Ref Range   Troponin i, poc 0.08 0.00 - 0.08 ng/mL   Comment 3            Comment: Due to the release kinetics of cTnI, a negative result within the first hours of the onset of symptoms does not rule out myocardial infarction with certainty. If myocardial infarction is still suspected, repeat the test at appropriate intervals.   I-Stat CG4 Lactic Acid, ED     Status: Abnormal   Collection Time: 07/25/17 12:33 PM  Result Value Ref Range   Lactic Acid, Venous 16.14 (HH) 0.5 - 1.9 mmol/L   Comment NOTIFIED PHYSICIAN   POC occult blood, ED Provider will collect     Status: None   Collection Time: 07/25/17 12:36 PM  Result Value Ref Range   Fecal Occult Bld NEGATIVE NEGATIVE  I-stat chem 8, ed     Status: Abnormal   Collection Time: 07/25/17 12:37 PM  Result Value Ref Range   Sodium 139 135 - 145 mmol/L   Potassium 3.9 3.5 - 5.1 mmol/L   Chloride 103 101 - 111 mmol/L   BUN 11 6 - 20 mg/dL   Creatinine, Ser 1.30 (H) 0.61 - 1.24 mg/dL   Glucose, Bld 120 (H) 65 - 99 mg/dL   Calcium, Ion 1.09 (L) 1.15 - 1.40 mmol/L   TCO2 15 (L) 22 - 32 mmol/L   Hemoglobin 9.9 (L) 13.0 - 17.0 g/dL   HCT 29.0 (L) 39.0 - 52.0 %  Urinalysis, Routine w reflex microscopic     Status: Abnormal   Collection Time: 07/25/17  2:40 PM  Result Value Ref Range   Color, Urine YELLOW YELLOW   APPearance CLEAR CLEAR   Specific Gravity, Urine 1.010 1.005 - 1.030   pH 6.0 5.0 - 8.0   Glucose, UA NEGATIVE NEGATIVE mg/dL   Hgb urine dipstick LARGE (A) NEGATIVE   Bilirubin Urine NEGATIVE NEGATIVE   Ketones, ur 20 (A) NEGATIVE mg/dL   Protein, ur 30 (A) NEGATIVE mg/dL   Nitrite NEGATIVE NEGATIVE   Leukocytes, UA NEGATIVE NEGATIVE   RBC / HPF 0-5 0 - 5 RBC/hpf   WBC, UA 0-5 0 - 5 WBC/hpf   Bacteria, UA FEW (A) NONE SEEN   Squamous Epithelial / LPF NONE SEEN NONE SEEN   Mucus PRESENT    Hyaline Casts, UA PRESENT     Sperm, UA PRESENT   Urine rapid drug screen (hosp performed)     Status: None   Collection Time: 07/25/17  4:07 PM  Result Value Ref Range   Opiates NONE DETECTED NONE DETECTED   Cocaine NONE DETECTED NONE DETECTED   Benzodiazepines NONE DETECTED NONE DETECTED   Amphetamines NONE DETECTED NONE DETECTED   Tetrahydrocannabinol NONE DETECTED NONE DETECTED   Barbiturates NONE DETECTED NONE DETECTED    Comment:        DRUG SCREEN FOR MEDICAL PURPOSES ONLY.  IF CONFIRMATION IS NEEDED FOR ANY PURPOSE, NOTIFY LAB WITHIN 5 DAYS.        LOWEST DETECTABLE LIMITS FOR URINE DRUG SCREEN Drug Class       Cutoff (ng/mL) Amphetamine  1000 Barbiturate      200 Benzodiazepine   756 Tricyclics       433 Opiates          300 Cocaine          300 THC              50   Comprehensive metabolic panel     Status: Abnormal   Collection Time: 07/25/17  4:40 PM  Result Value Ref Range   Sodium 140 135 - 145 mmol/L   Potassium 2.8 (L) 3.5 - 5.1 mmol/L   Chloride 107 101 - 111 mmol/L   CO2 19 (L) 22 - 32 mmol/L   Glucose, Bld 123 (H) 65 - 99 mg/dL   BUN 11 6 - 20 mg/dL   Creatinine, Ser 1.11 0.61 - 1.24 mg/dL   Calcium 8.3 (L) 8.9 - 10.3 mg/dL   Total Protein 5.2 (L) 6.5 - 8.1 g/dL   Albumin 2.6 (L) 3.5 - 5.0 g/dL   AST 110 (H) 15 - 41 U/L   ALT 22 17 - 63 U/L   Alkaline Phosphatase 104 38 - 126 U/L   Total Bilirubin 0.8 0.3 - 1.2 mg/dL   GFR calc non Af Amer >60 >60 mL/min   GFR calc Af Amer >60 >60 mL/min    Comment: (NOTE) The eGFR has been calculated using the CKD EPI equation. This calculation has not been validated in all clinical situations. eGFR's persistently <60 mL/min signify possible Chronic Kidney Disease.    Anion gap 14 5 - 15  Lactic acid, plasma     Status: Abnormal   Collection Time: 07/25/17  4:40 PM  Result Value Ref Range   Lactic Acid, Venous 4.6 (HH) 0.5 - 1.9 mmol/L    Comment: CRITICAL RESULT CALLED TO, READ BACK BY AND VERIFIED WITH: .K.FLUSHER,RN 10.13.18  SPIKESN [id number]   I-Stat arterial blood gas, ED     Status: Abnormal   Collection Time: 07/25/17  5:03 PM  Result Value Ref Range   pH, Arterial 7.420 7.350 - 7.450   pCO2 arterial 35.4 32.0 - 48.0 mmHg   pO2, Arterial 73.0 (L) 83.0 - 108.0 mmHg   Bicarbonate 23.0 20.0 - 28.0 mmol/L   TCO2 24 22 - 32 mmol/L   O2 Saturation 95.0 %   Acid-base deficit 1.0 0.0 - 2.0 mmol/L   Patient temperature 98.6 F    Collection site RADIAL, ALLEN'S TEST ACCEPTABLE    Drawn by Operator    Sample type ARTERIAL   CK     Status: Abnormal   Collection Time: 07/25/17  6:00 PM  Result Value Ref Range   Total CK 4,532 (H) 49 - 397 U/L    Comment: RESULTS CONFIRMED BY MANUAL DILUTION  MRSA PCR Screening     Status: None   Collection Time: 07/25/17  6:11 PM  Result Value Ref Range   MRSA by PCR NEGATIVE NEGATIVE    Comment:        The GeneXpert MRSA Assay (FDA approved for NASAL specimens only), is one component of a comprehensive MRSA colonization surveillance program. It is not intended to diagnose MRSA infection nor to guide or monitor treatment for MRSA infections.   Lactic acid, plasma     Status: Abnormal   Collection Time: 07/25/17  7:05 PM  Result Value Ref Range   Lactic Acid, Venous 2.2 (HH) 0.5 - 1.9 mmol/L    Comment: CRITICAL RESULT CALLED TO, READ BACK BY AND VERIFIED WITH: R.PIZERGER,RN  2001 10.13.18 SPIKESN    Ct Head Wo Contrast  Result Date: 07/25/2017 CLINICAL DATA:  Found down, possible STEMI EXAM: CT HEAD WITHOUT CONTRAST TECHNIQUE: Contiguous axial images were obtained from the base of the skull through the vertex without intravenous contrast. COMPARISON:  04/30/2014 FINDINGS: Brain: No evidence of acute infarction, hemorrhage, hydrocephalus, extra-axial collection or mass lesion/mass effect. Subcortical white matter and periventricular small vessel ischemic changes. Chronic right basal ganglia lacunar infarct. Bilateral basal ganglia calcifications. Vascular: Intracranial  atherosclerosis. Skull: Normal. Negative for fracture or focal lesion. Sinuses/Orbits: The visualized paranasal sinuses are essentially clear. The mastoid air cells are unopacified. Other: None. IMPRESSION: No evidence of acute intracranial abnormality. Small vessel ischemic changes. Chronic right basal ganglia lacunar infarct. Electronically Signed   By: Julian Hy M.D.   On: 07/25/2017 13:25   Dg Chest Port 1 View  Result Date: 07/25/2017 CLINICAL DATA:  Found lying on the floor and hypothermic. EXAM: PORTABLE CHEST 1 VIEW COMPARISON:  08/21/2015 FINDINGS: Cardiomediastinal silhouette is normal. Mediastinal contours appear intact. Tortuosity and calcific atherosclerotic disease of the aorta. There is no evidence of focal airspace consolidation, pleural effusion or pneumothorax. Osseous structures are without acute abnormality. Soft tissues are grossly normal. IMPRESSION: No active disease. Electronically Signed   By: Fidela Salisbury M.D.   On: 07/25/2017 12:49   Dg Shoulder Left Portable  Result Date: 07/25/2017 CLINICAL DATA:  Left shoulder pain since a fall today. Initial encounter. EXAM: LEFT SHOULDER - 1 VIEW COMPARISON:  Plain films left humerus this same day. FINDINGS: Comminuted surgical neck fracture of the left humerus is identified. There is fragment override and approximately 1 shaft width anterior displacement the distal fragment. The fracture likely involves the greater tuberosity. Remote healed left clavicle fracture is noted. Imaged left lung and ribs are clear. IMPRESSION: Acute, comminuted fracture of the surgical neck of the left humerus as described. Electronically Signed   By: Inge Rise M.D.   On: 07/25/2017 15:13   Dg Humerus Left  Result Date: 07/25/2017 CLINICAL DATA:  Left upper arm deformity after fall yesterday. EXAM: LEFT HUMERUS - 2+ VIEW COMPARISON:  None. FINDINGS: Moderately displaced and comminuted fracture is seen involving the proximal left humeral  head and neck. Old distal left clavicular fracture is noted as well. IMPRESSION: Moderately displaced and comminuted proximal left humeral head and neck fracture is noted. Electronically Signed   By: Marijo Conception, M.D.   On: 07/25/2017 12:51    ROS: ROS Pain and inability to move left upper extremity Recent diagnosis of acute MI  Physical Exam: Alert and appropriate 71 y/o male in no acute distress currently Cervical spine with no pain with rom or tenderness Right upper extremity full rom, no tenderness or deformity Left shoulder: moderate edema to proximal humerus, nv intact distally No signs of open fracture Hips non tender and no deformity Bilateral lower extremities show right tibia abrasion, superficial Full rom of bilateral lower extremities with no other trauma or pain Physical Exam   Assessment/Plan Assessment: 1. Acute MI  2. left proximal humerus fracture  Plan: Please obtain CT left shoulder when medically stable Currently pt has minimal to no pain in the left shoulder in his sling Recommend conservative management currently due to ongoing cardiac issues Pain management as needed  Dr. Stann Mainland to evaluate pt in the morning

## 2017-07-25 NOTE — ED Notes (Signed)
ED Provider at bedside. 

## 2017-07-25 NOTE — Progress Notes (Signed)
Consult received.  Patient fell last night, and injured his left arm.  When EMS arrived, there was concerns regarding his EKG.  He was found to have acute MI.  Emergency department staff indicates that he is too unstable to go to the cath lab at this point, and he will be admitted to the medical ICU.  Left humerus films were obtained in the emergency department showing a markedly displaced proximal humerus fracture.  Orthopedic consultation was placed.    Preliminary recommendations include sling to the left upper extremity.  He needs dedicated left shoulder films.  When the patient is more stable, he needs a CT scan of the left shoulder.  Dr. Stann Mainland will follow up.

## 2017-07-25 NOTE — ED Triage Notes (Addendum)
Pt arrives from home where he lives alone, found by friend in a cold house laying on floot, lsn yesterday at 12pm. Pt altered but responsive to voice and oriented to person and place. Pt noted to have ST elevation with ems and on arrival however due to possible trauma and unsure etiology will continue work up in ED at this time, stemi team is aware of pt. Pt is extremely hypothermic and with tremors with difficult to get clear ekg. Pt has deformity to left humeral area.

## 2017-07-26 ENCOUNTER — Inpatient Hospital Stay (HOSPITAL_COMMUNITY): Payer: Medicare Other

## 2017-07-26 ENCOUNTER — Other Ambulatory Visit (HOSPITAL_COMMUNITY): Payer: Medicare Other

## 2017-07-26 ENCOUNTER — Encounter (HOSPITAL_COMMUNITY): Payer: Self-pay

## 2017-07-26 DIAGNOSIS — R9431 Abnormal electrocardiogram [ECG] [EKG]: Secondary | ICD-10-CM

## 2017-07-26 DIAGNOSIS — R410 Disorientation, unspecified: Secondary | ICD-10-CM

## 2017-07-26 LAB — BASIC METABOLIC PANEL
Anion gap: 13 (ref 5–15)
BUN: 12 mg/dL (ref 6–20)
CHLORIDE: 105 mmol/L (ref 101–111)
CO2: 21 mmol/L — ABNORMAL LOW (ref 22–32)
Calcium: 8 mg/dL — ABNORMAL LOW (ref 8.9–10.3)
Creatinine, Ser: 0.82 mg/dL (ref 0.61–1.24)
Glucose, Bld: 89 mg/dL (ref 65–99)
POTASSIUM: 3.4 mmol/L — AB (ref 3.5–5.1)
SODIUM: 139 mmol/L (ref 135–145)

## 2017-07-26 LAB — MAGNESIUM: Magnesium: 1.8 mg/dL (ref 1.7–2.4)

## 2017-07-26 LAB — ECHOCARDIOGRAM LIMITED
HEIGHTINCHES: 67 in
WEIGHTICAEL: 2155.22 [oz_av]

## 2017-07-26 LAB — HEPARIN LEVEL (UNFRACTIONATED)
HEPARIN UNFRACTIONATED: 0.3 [IU]/mL (ref 0.30–0.70)
Heparin Unfractionated: 0.29 IU/mL — ABNORMAL LOW (ref 0.30–0.70)

## 2017-07-26 LAB — CBC
HCT: 25.3 % — ABNORMAL LOW (ref 39.0–52.0)
HEMOGLOBIN: 7.5 g/dL — AB (ref 13.0–17.0)
MCH: 22.1 pg — ABNORMAL LOW (ref 26.0–34.0)
MCHC: 29.6 g/dL — ABNORMAL LOW (ref 30.0–36.0)
MCV: 74.4 fL — ABNORMAL LOW (ref 78.0–100.0)
PLATELETS: 209 10*3/uL (ref 150–400)
RBC: 3.4 MIL/uL — AB (ref 4.22–5.81)
RDW: 19.6 % — ABNORMAL HIGH (ref 11.5–15.5)
WBC: 9.6 10*3/uL (ref 4.0–10.5)

## 2017-07-26 LAB — CK
CK TOTAL: 5332 U/L — AB (ref 49–397)
Total CK: 5971 U/L — ABNORMAL HIGH (ref 49–397)

## 2017-07-26 LAB — PROCALCITONIN: Procalcitonin: 6.18 ng/mL

## 2017-07-26 MED ORDER — HEPARIN (PORCINE) IN NACL 100-0.45 UNIT/ML-% IJ SOLN
1700.0000 [IU]/h | INTRAMUSCULAR | Status: DC
  Administered 2017-07-28: 1700 [IU]/h via INTRAVENOUS
  Administered 2017-07-28: 1400 [IU]/h via INTRAVENOUS
  Administered 2017-07-29: 1700 [IU]/h via INTRAVENOUS
  Filled 2017-07-26 (×4): qty 250

## 2017-07-26 MED ORDER — POTASSIUM CHLORIDE CRYS ER 20 MEQ PO TBCR: 40.0000 meq | EXTENDED_RELEASE_TABLET | Freq: Once | ORAL | Status: DC

## 2017-07-26 MED ORDER — LORAZEPAM 0.5 MG PO TABS
0.5000 mg | ORAL_TABLET | Freq: Two times a day (BID) | ORAL | Status: DC
  Administered 2017-07-26 – 2017-07-28 (×2): 0.5 mg via ORAL
  Filled 2017-07-26 (×3): qty 1

## 2017-07-26 MED ORDER — PNEUMOCOCCAL VAC POLYVALENT 25 MCG/0.5ML IJ INJ
0.5000 mL | INJECTION | INTRAMUSCULAR | Status: AC
  Administered 2017-07-31: 0.5 mL via INTRAMUSCULAR
  Filled 2017-07-26: qty 0.5

## 2017-07-26 MED ORDER — VANCOMYCIN HCL IN DEXTROSE 750-5 MG/150ML-% IV SOLN
750.0000 mg | Freq: Two times a day (BID) | INTRAVENOUS | Status: DC
  Administered 2017-07-26 – 2017-07-27 (×3): 750 mg via INTRAVENOUS
  Filled 2017-07-26 (×3): qty 150

## 2017-07-26 MED ORDER — INFLUENZA VAC SPLIT HIGH-DOSE 0.5 ML IM SUSY
0.5000 mL | PREFILLED_SYRINGE | INTRAMUSCULAR | Status: AC
  Administered 2017-08-01: 0.5 mL via INTRAMUSCULAR
  Filled 2017-07-26 (×2): qty 0.5

## 2017-07-26 MED ORDER — TRAZODONE HCL 100 MG PO TABS
150.0000 mg | ORAL_TABLET | Freq: Every day | ORAL | Status: DC
  Administered 2017-07-26 – 2017-08-02 (×4): 150 mg via ORAL
  Filled 2017-07-26 (×4): qty 1

## 2017-07-26 MED ORDER — POTASSIUM CHLORIDE 20 MEQ PO PACK
40.0000 meq | PACK | Freq: Once | ORAL | Status: AC
Start: 2017-07-26 — End: 2017-07-26
  Administered 2017-07-26: 40 meq via ORAL
  Filled 2017-07-26: qty 2

## 2017-07-26 NOTE — Progress Notes (Signed)
Patient becoming increasingly more agitated and confused, tried to help patient eat some food but he would hold it in his mouth and then start to cough, patient is at risk for aspiration due to hallucinations and confusion, patient made NPO, Pete, NP notified of patients condition, will continue to monitor.  Rowe Pavy, RN

## 2017-07-26 NOTE — Progress Notes (Signed)
  Echocardiogram 2D Echocardiogram has been performed.  Merrie Roof F 07/26/2017, 4:03 PM

## 2017-07-26 NOTE — Progress Notes (Signed)
Patient needs to go to CT for a scan of his left shoulder, patient is confused, unable to follow commands or sit still for a CT currently, will continue to monitor and when patient is able to cooperate will take patient to CT.  Juanitta Earnhardt, Marsh Dolly, RN

## 2017-07-26 NOTE — Progress Notes (Signed)
Pharmacy Antibiotic Note  David Lucero is a 71 y.o. male admitted on 07/25/2017 with sepsis.  Pharmacy has been consulted for vancomycin and zosyn dosing. Tm 100 and WBC improved to 9.6. SCr is much improved today to 0.82, est CrCl ~ 70 ml/min  Plan: Increase vancomycin to 750 mg IV q 12 hrs. Continue Zosyn 3.375gm IV Q8H (4 hr inf) F/u renal fxn, C&S, clinical status and trough at SS  Height: 5\' 7"  (170.2 cm) Weight: 134 lb 11.2 oz (61.1 kg) IBW/kg (Calculated) : 66.1  Temp (24hrs), Avg:97 F (36.1 C), Min:87.9 F (31.1 C), Max:100 F (37.8 C)   Recent Labs Lab 07/25/17 1215 07/25/17 1233 07/25/17 1237 07/25/17 1640 07/25/17 1905 07/26/17 0629  WBC 18.8*  --   --   --   --  9.6  CREATININE 1.67*  --  1.30* 1.11  --  0.82  LATICACIDVEN  --  16.14*  --  4.6* 2.2*  --     Estimated Creatinine Clearance: 70.4 mL/min (by C-G formula based on SCr of 0.82 mg/dL).    No Known Allergies  Antimicrobials this admission:  Vanc 10/13>> Zosyn 10/13>>  Dose adjustments this admission:   Microbiology results:  10/13 BCx x 2 >  10/13 UCx x 2 > 10/13 MRSA PCR: neg  Thank you for allowing pharmacy to be a part of this patient's care.  Uvaldo Rising, BCPS  Clinical Pharmacist Pager [phone]  07/26/2017 10:02 AM

## 2017-07-26 NOTE — Progress Notes (Signed)
Ortho Note:  Awaiting CT scan of left shoulder for further updates.  Anticipate non operative treatment of the left proximal humerus with follow up.  Maintain sling to LUE and NWB for now.  [name]

## 2017-07-26 NOTE — Progress Notes (Signed)
[facility] for heparin Indication: chest pain/ACS and atrial fibrillation  No Known Allergies  Patient Measurements: Height: 5\' 7"  (170.2 cm) Weight: 134 lb 11.2 oz (61.1 kg) IBW/kg (Calculated) : 66.1 Heparin Dosing Weight: 66.2kg  Vital Signs: Temp: 99.9 F (37.7 C) (10/14 0900) Temp Source: Oral (10/14 0404) BP: 85/61 (10/14 0900)  Labs:  Recent Labs  07/25/17 1215 07/25/17 1237 07/25/17 1640 07/25/17 1800 07/25/17 2223 07/26/17 0053 07/26/17 0629  HGB 8.4* 9.9*  --   --   --   --  7.5*  HCT 28.6* 29.0*  --   --   --   --  25.3*  PLT 324  --   --   --   --   --  209  APTT 33  --   --   --   --   --   --   LABPROT 16.0*  --   --   --   --   --   --   INR 1.29  --   --   --   --   --   --   HEPARINUNFRC  --   --   --   --  0.35  --  0.30  CREATININE 1.67* 1.30* 1.11  --   --   --  0.82  CKTOTAL 3,172*  --   --  4,532*  --  5,332* 5,971*    Estimated Creatinine Clearance: 70.4 mL/min (by C-G formula based on SCr of 0.82 mg/dL).  . sodium chloride 250 mL (07/26/17 0900)  . heparin 1,000 Units/hr (07/26/17 0900)  . lactated ringers 100 mL/hr at 07/26/17 0942  . piperacillin-tazobactam (ZOSYN)  IV 3.375 g (07/26/17 0706)  . vancomycin Stopped (07/25/17 1731)    Assessment: .71 y.o. male with Afib/MI on IV heparin with therapeutic heparin level, at low end of goal range.  No bleeding or complications noted, but Hgb down to 7.5  Goal of Therapy:  Heparin level 0.3-0.7 units/ml Monitor platelets by anticoagulation protocol: Yes   Plan:  Increase IV heparin to 1100 units/hr Recheck heparin level in 8 hrs Daily heparin level and CBC.  Uvaldo Rising, BCPS  Clinical Pharmacist Pager [phone]  07/26/2017 9:53 AM

## 2017-07-26 NOTE — Progress Notes (Signed)
PULMONARY / CRITICAL CARE MEDICINE   Name: David Lucero MRN: [id number] DOB: Jul 28, 1946    ADMISSION DATE:  07/25/2017 CONSULTATION DATE:  10/13  REFERRING MD:  Maryan Rued  CHIEF COMPLAINT:  Delirium, and metabolic acidosis  HISTORY OF PRESENT ILLNESS:   71 year old male patient with multiple comorbidities including: Anxiety, alcohol abuse, depression, lung cancer, chronic obstructive pulmonary disease. EMS was called the a.m. of 10/13 after the patient was found lying on the floor of his home by a friend. The house was cold. He had last been seen normal on 10/12 around noon. On EMS arrival he was noted to have ST elevation with left upper extremity trauma. He was found to be hypothermic and tremulous, he was transferred to [facility] emergency room ER evaluation: In ER complained of left arm pain, denied chest discomfort or shortness of breath. 12-lead showed atrial fibrillation, ST depression in V1 through V3, also concerns of nonspecific intraventricular conduction delay His initial temperature was 87.9 Fahrenheit, he was bradycardic, and had rapid respiratory rate. His initial lactic acid was 16, hemoglobin 8. A chest x-ray demonstrated fracture of the proximal left humerus. His troponin was mildly elevated. He was treated with warmed IV fluids, arm was immobilized, seen by Dr. Ellyn Hack who felt EKG was consistent with inferior MI. He recommended IV heparin, and supportive care for medical issues prior to further cardiac intervention. Given his degree of metabolic disarray critical care was asked to admit.   SUBJECTIVE:  No distress  VITAL SIGNS: BP (!) 92/59   Pulse 97   Temp 99.5 F (37.5 C)   Resp 15   Ht 5\' 7"  (1.702 m)   Wt 134 lb 11.2 oz (61.1 kg)   SpO2 97%   BMI 21.10 kg/m   HEMODYNAMICS:    VENTILATOR SETTINGS:    INTAKE / OUTPUT: I/O last 3 completed shifts: In: 4629.6 [I.V.:2229.6; IV Piggyback:2400] Out: 530 [Urine:530]  PHYSICAL EXAMINATION: General:   Chronically ill-appearing 71 year old male resting in bed no distress Neuro:  Awake denies pain, intermittently confused still HEENT:  Normocephalic/atraumatic no JVD Cardiovascular:  Bradycardic irregularly irregular no audible murmur Lungs:  Clear, no accessory muscle use Abdomen:  Soft nontender no organomegaly. Positive bowel sounds Musculoskeletal:  Left shoulder/arm immobilized pain to movement noted Skin:  Abrasions and scabbed areas to both knees no edema skin is cool but no longer mottled  LABS:  BMET  Recent Labs Lab 07/25/17 1215 07/25/17 1237 07/25/17 1640 07/26/17 0629  NA 135 139 140 139  K 3.6 3.9 2.8* 3.4*  CL 99* 103 107 105  CO2 13*  --  19* 21*  BUN 11 11 11 12   CREATININE 1.67* 1.30* 1.11 0.82  GLUCOSE 123* 120* 123* 89    Electrolytes  Recent Labs Lab 07/25/17 1215 07/25/17 1640 07/26/17 0629  CALCIUM 8.7* 8.3* 8.0*    CBC  Recent Labs Lab 07/25/17 1215 07/25/17 1237 07/26/17 0629  WBC 18.8*  --  9.6  HGB 8.4* 9.9* 7.5*  HCT 28.6* 29.0* 25.3*  PLT 324  --  209    Coag's  Recent Labs Lab 07/25/17 1215  APTT 33  INR 1.29    Sepsis Markers  Recent Labs Lab 07/25/17 1233 07/25/17 1640 07/25/17 1905  LATICACIDVEN 16.14* 4.6* 2.2*    ABG  Recent Labs Lab 07/25/17 1703  PHART 7.420  PCO2ART 35.4  PO2ART 73.0*    Liver Enzymes  Recent Labs Lab 07/25/17 1215 07/25/17 1640  AST 95* 110*  ALT 23 22  ALKPHOS 123 104  BILITOT 0.9 0.8  ALBUMIN 2.7* 2.6*    Cardiac Enzymes No results for input(s): TROPONINI, PROBNP in the last 168 hours.  Glucose No results for input(s): GLUCAP in the last 168 hours.  Imaging Ct Head Wo Contrast  Result Date: 07/25/2017 CLINICAL DATA:  Found down, possible STEMI EXAM: CT HEAD WITHOUT CONTRAST TECHNIQUE: Contiguous axial images were obtained from the base of the skull through the vertex without intravenous contrast. COMPARISON:  04/30/2014 FINDINGS: Brain: No evidence of acute  infarction, hemorrhage, hydrocephalus, extra-axial collection or mass lesion/mass effect. Subcortical white matter and periventricular small vessel ischemic changes. Chronic right basal ganglia lacunar infarct. Bilateral basal ganglia calcifications. Vascular: Intracranial atherosclerosis. Skull: Normal. Negative for fracture or focal lesion. Sinuses/Orbits: The visualized paranasal sinuses are essentially clear. The mastoid air cells are unopacified. Other: None. IMPRESSION: No evidence of acute intracranial abnormality. Small vessel ischemic changes. Chronic right basal ganglia lacunar infarct. Electronically Signed   By: Julian Hy M.D.   On: 07/25/2017 13:25   Dg Chest Port 1 View  Result Date: 07/26/2017 CLINICAL DATA:  Atelectasis. EXAM: PORTABLE CHEST 1 VIEW COMPARISON:  Radiograph of July 25, 2017. FINDINGS: The heart size and mediastinal contours are within normal limits. Both lungs are clear. No pneumothorax or pleural effusion is noted. Comminuted proximal left humeral fracture is again noted. Old left rib fractures are noted. IMPRESSION: Continued presence of comminuted proximal left humeral fracture. No acute cardiopulmonary abnormality seen. Electronically Signed   By: Marijo Conception, M.D.   On: 07/26/2017 07:10   Dg Chest Port 1 View  Result Date: 07/25/2017 CLINICAL DATA:  Found lying on the floor and hypothermic. EXAM: PORTABLE CHEST 1 VIEW COMPARISON:  08/21/2015 FINDINGS: Cardiomediastinal silhouette is normal. Mediastinal contours appear intact. Tortuosity and calcific atherosclerotic disease of the aorta. There is no evidence of focal airspace consolidation, pleural effusion or pneumothorax. Osseous structures are without acute abnormality. Soft tissues are grossly normal. IMPRESSION: No active disease. Electronically Signed   By: Fidela Salisbury M.D.   On: 07/25/2017 12:49   Dg Shoulder Left Portable  Result Date: 07/25/2017 CLINICAL DATA:  Left shoulder pain since  a fall today. Initial encounter. EXAM: LEFT SHOULDER - 1 VIEW COMPARISON:  Plain films left humerus this same day. FINDINGS: Comminuted surgical neck fracture of the left humerus is identified. There is fragment override and approximately 1 shaft width anterior displacement the distal fragment. The fracture likely involves the greater tuberosity. Remote healed left clavicle fracture is noted. Imaged left lung and ribs are clear. IMPRESSION: Acute, comminuted fracture of the surgical neck of the left humerus as described. Electronically Signed   By: Inge Rise M.D.   On: 07/25/2017 15:13   Dg Humerus Left  Result Date: 07/25/2017 CLINICAL DATA:  Left upper arm deformity after fall yesterday. EXAM: LEFT HUMERUS - 2+ VIEW COMPARISON:  None. FINDINGS: Moderately displaced and comminuted fracture is seen involving the proximal left humeral head and neck. Old distal left clavicular fracture is noted as well. IMPRESSION: Moderately displaced and comminuted proximal left humeral head and neck fracture is noted. Electronically Signed   By: Marijo Conception, M.D.   On: 07/25/2017 12:51     STUDIES:  CT head 10/13: No acute changes, chronic right basal ganglial infarct Left shoulder film 10/13:Acute, comminuted fracture of the surgical neck of the left humerus  CULTURES: Blood cultures 2 10/13>>> UC 10/13>>>  ANTIBIOTICS:   SIGNIFICANT EVENTS:   LINES/TUBES:   DISCUSSION: 71 year old  white male with a significant history of alcohol abuse. He lives alone. Also has remote history of laryngeal and lung cancer. He fell at home after becoming lightheaded. He's had prior episodes of lightheadedness in the past, fell on once before. He was found by his friend this a.m. on 10/13 confused, and on the floor. He had a left humerus fracture, and ST elevation inferior MI. Further evaluation demonstrates rhabdomyolysis, acute kidney injury, anemia, and tachypnea. He has done well over the evening Still a  little confused but cooperative Metabolic indices have improved We will move him to telemetry, further interventions per cardiology, eventually will need CT of left shoulder. We'll last medicine to assume care   ASSESSMENT / PLAN:  PULMONARY A: Tachypnea in the setting of metabolic acidosis Chest x-ray personally reviewed: No active cardiopulmonary disease noted. P:   Wean oxygen Continue pulse oximetry  CARDIOVASCULAR A:  Acute inferior ST elevation myocardial infarction Bradycardia Atrial fibrillation  On initial presentation prior warming had marked inferior ST elevation this improved with hydration and warming P:  Continuing telemetry Continuing heparin Further recommendations per cardiology  RENAL A:   Severe lactic acidosis Acute kidney injury Probable rhabdomyolysis Hypokalemia Total CKs continued to rise some has of midnight last night, however renal function has normalized and lactic acid cleared he's been hemodynamically stable overnight P:   Continue IV hydration Replace potassium Renal dose medications Follow-up a.m. chemistry  GASTROINTESTINAL A:   Severe malnutrition in setting of alcohol abuse P:   Dietary consult pending advance diet  HEMATOLOGIC A:   Anemia of chronic disease History of laryngeal and lung cancer, records not currently available. Apparently treated 10 years ago. His hemoglobin has drifted pretty significantly, however I do not believe his initial hemoglobin was accurate, I suspect that was hemoconcentrated P:  Serial CBC transfuse per protocol   INFECTIOUS A:   Systemic inflammatory response, no current clear source for infection His white blood cell count has normalized, I suspect initially leukocytosis was a stress response. Culture data negative to date P:   We will check pro-calcitonin this morning, if negative I think we can go ahead and stop antibiotics as no evidence of infection  ENDOCRINE A:   Hyperglycemia P:    Trend capillary blood glucose   NEUROLOGIC A:   Acute delirium: This is improved with hydration and warming History of alcohol abuse at high risk for DTs; he is anxious, but appropriate, he's asking for more Ativan as well P:   Continue CIWA protocol Continue thiamine and folate Adding trazodone per patient request  Musculoskeletal  A: Left proximal humerus fracture Plan Keep immobilized Will need CT of left shoulder eventually.   FAMILY  - Updates:    Erick Colace ACNP-BC [place] Pager # [phone] OR # [phone] if no answer    07/26/2017, 9:18 AM

## 2017-07-26 NOTE — Progress Notes (Signed)
Attempted to call brother, number on facesheet is not working, patient unable to provide any information at this time.  Rowe Pavy, RN

## 2017-07-26 NOTE — Progress Notes (Signed)
ANTICOAGULATION CONSULT NOTE   Pharmacy Consult for heparin Indication: chest pain/ACS and atrial fibrillation  No Known Allergies  Patient Measurements: Height: 5\' 7"  (170.2 cm) Weight: 134 lb 11.2 oz (61.1 kg) IBW/kg (Calculated) : 66.1 Heparin Dosing Weight: 66.2kg  Vital Signs: Temp: 97.5 F (36.4 C) (10/14 2100) Temp Source: Core (Comment) (10/14 2000) BP: 136/87 (10/14 2100) Pulse Rate: 86 (10/14 1030)  Labs:  Recent Labs  07/25/17 1215 07/25/17 1237 07/25/17 1640 07/25/17 1800 07/25/17 2223 07/26/17 0053 07/26/17 0629 07/26/17 1948  HGB 8.4* 9.9*  --   --   --   --  7.5*  --   HCT 28.6* 29.0*  --   --   --   --  25.3*  --   PLT 324  --   --   --   --   --  209  --   APTT 33  --   --   --   --   --   --   --   LABPROT 16.0*  --   --   --   --   --   --   --   INR 1.29  --   --   --   --   --   --   --   HEPARINUNFRC  --   --   --   --  0.35  --  0.30 0.29*  CREATININE 1.67* 1.30* 1.11  --   --   --  0.82  --   CKTOTAL 3,172*  --   --  4,532*  --  5,332* 5,971*  --     Estimated Creatinine Clearance: 70.4 mL/min (by C-G formula based on SCr of 0.82 mg/dL).  . sodium chloride 250 mL (07/26/17 2100)  . heparin 1,100 Units/hr (07/26/17 2100)  . lactated ringers 100 mL/hr at 07/26/17 2100  . piperacillin-tazobactam (ZOSYN)  IV 3.375 g (07/26/17 1443)  . vancomycin Stopped (07/26/17 1130)    Assessment: .71 y.o. male with Afib/MI on IV heparin. Heparin level now very slightly subtherapeutic at 0.29 after rate increase. Hg down 7.5, plt wnl. No bleeding documented.  Goal of Therapy:  Heparin level 0.3-0.7 units/ml Monitor platelets by anticoagulation protocol: Yes   Plan:  Increase IV heparin to 1150 units/hr Recheck heparin level in 8 hrs Daily heparin level and CBC Monitor for s/sx bleeding   Elicia Lamp, PharmD, BCPS Clinical Pharmacist 07/26/2017 9:17 PM

## 2017-07-27 ENCOUNTER — Encounter (HOSPITAL_COMMUNITY): Payer: Self-pay | Admitting: Cardiology

## 2017-07-27 ENCOUNTER — Inpatient Hospital Stay (HOSPITAL_COMMUNITY): Payer: Medicare Other

## 2017-07-27 DIAGNOSIS — E872 Acidosis: Secondary | ICD-10-CM

## 2017-07-27 DIAGNOSIS — D649 Anemia, unspecified: Secondary | ICD-10-CM

## 2017-07-27 DIAGNOSIS — S42292A Other displaced fracture of upper end of left humerus, initial encounter for closed fracture: Secondary | ICD-10-CM

## 2017-07-27 DIAGNOSIS — N179 Acute kidney failure, unspecified: Secondary | ICD-10-CM

## 2017-07-27 DIAGNOSIS — T68XXXA Hypothermia, initial encounter: Secondary | ICD-10-CM

## 2017-07-27 DIAGNOSIS — I2119 ST elevation (STEMI) myocardial infarction involving other coronary artery of inferior wall: Principal | ICD-10-CM

## 2017-07-27 LAB — CK TOTAL AND CKMB (NOT AT ARMC)
CK, MB: 12.4 ng/mL — AB (ref 0.5–5.0)
Relative Index: 0.6 (ref 0.0–2.5)
Total CK: 2172 U/L — ABNORMAL HIGH (ref 49–397)

## 2017-07-27 LAB — URINE CULTURE: Culture: NO GROWTH

## 2017-07-27 LAB — BASIC METABOLIC PANEL
Anion gap: 10 (ref 5–15)
BUN: 9 mg/dL (ref 6–20)
CHLORIDE: 104 mmol/L (ref 101–111)
CO2: 24 mmol/L (ref 22–32)
CREATININE: 0.57 mg/dL — AB (ref 0.61–1.24)
Calcium: 7.8 mg/dL — ABNORMAL LOW (ref 8.9–10.3)
Glucose, Bld: 88 mg/dL (ref 65–99)
POTASSIUM: 3.5 mmol/L (ref 3.5–5.1)
SODIUM: 138 mmol/L (ref 135–145)

## 2017-07-27 LAB — HEPARIN LEVEL (UNFRACTIONATED)
HEPARIN UNFRACTIONATED: 0.24 [IU]/mL — AB (ref 0.30–0.70)
HEPARIN UNFRACTIONATED: 0.23 [IU]/mL — AB (ref 0.30–0.70)

## 2017-07-27 LAB — CBC
HCT: 25 % — ABNORMAL LOW (ref 39.0–52.0)
HEMOGLOBIN: 7.6 g/dL — AB (ref 13.0–17.0)
MCH: 22.5 pg — AB (ref 26.0–34.0)
MCHC: 30.4 g/dL (ref 30.0–36.0)
MCV: 74 fL — ABNORMAL LOW (ref 78.0–100.0)
PLATELETS: 182 10*3/uL (ref 150–400)
RBC: 3.38 MIL/uL — AB (ref 4.22–5.81)
RDW: 19.8 % — ABNORMAL HIGH (ref 11.5–15.5)
WBC: 8.4 10*3/uL (ref 4.0–10.5)

## 2017-07-27 LAB — MYOGLOBIN, URINE: MYOGLOBIN UR: 18309 ng/mL — AB (ref 0–13)

## 2017-07-27 LAB — TROPONIN I: Troponin I: 6.47 ng/mL (ref <0.03)

## 2017-07-27 MED ORDER — CHLORHEXIDINE GLUCONATE 0.12 % MT SOLN
15.0000 mL | Freq: Two times a day (BID) | OROMUCOSAL | Status: DC
  Administered 2017-07-28 – 2017-08-10 (×27): 15 mL via OROMUCOSAL
  Filled 2017-07-27 (×23): qty 15

## 2017-07-27 MED ORDER — METOPROLOL TARTRATE 25 MG PO TABS: 25.0000 mg | ORAL_TABLET | Freq: Two times a day (BID) | ORAL | Status: DC

## 2017-07-27 MED ORDER — ORAL CARE MOUTH RINSE
15.0000 mL | Freq: Two times a day (BID) | OROMUCOSAL | Status: DC
  Administered 2017-07-27 – 2017-07-28 (×2): 15 mL via OROMUCOSAL

## 2017-07-27 MED ORDER — METOPROLOL TARTRATE 5 MG/5ML IV SOLN
2.5000 mg | Freq: Four times a day (QID) | INTRAVENOUS | Status: DC
  Administered 2017-07-27 – 2017-08-03 (×26): 2.5 mg via INTRAVENOUS
  Filled 2017-07-27 (×26): qty 5

## 2017-07-27 MED ORDER — ATORVASTATIN CALCIUM 40 MG PO TABS
40.0000 mg | ORAL_TABLET | Freq: Every day | ORAL | Status: DC
  Administered 2017-07-31 – 2017-08-02 (×3): 40 mg via ORAL
  Filled 2017-07-27 (×3): qty 1

## 2017-07-27 MED ORDER — CLOPIDOGREL BISULFATE 75 MG PO TABS
75.0000 mg | ORAL_TABLET | Freq: Every day | ORAL | Status: DC
  Administered 2017-07-28 – 2017-08-03 (×5): 75 mg via ORAL
  Filled 2017-07-27 (×5): qty 1

## 2017-07-27 NOTE — Progress Notes (Signed)
Called 6E for report, patient is no longer assigned to 6E10, waiting for patient to be approved to a new bed.  Rowe Pavy, RN

## 2017-07-27 NOTE — Progress Notes (Signed)
ANTICOAGULATION CONSULT NOTE - Follow Up Consult  Pharmacy Consult for Heparin  Indication: chest pain/ACS, atrial fibrillation   No Known Allergies  Patient Measurements: Height: 5\' 7"  (170.2 cm) Weight: 135 lb 9.3 oz (61.5 kg) IBW/kg (Calculated) : 66.1  Vital Signs: Temp: 98.3 F (36.8 C) (10/15 1626) Temp Source: Oral (10/15 1626) BP: 120/56 (10/15 1500)  Labs:  Recent Labs  07/25/17 1215 07/25/17 1237 07/25/17 1640 07/25/17 1800  07/26/17 0053 07/26/17 0629 07/26/17 1948 07/27/17 0407 07/27/17 1530  HGB 8.4* 9.9*  --   --   --   --  7.5*  --  7.6*  --   HCT 28.6* 29.0*  --   --   --   --  25.3*  --  25.0*  --   PLT 324  --   --   --   --   --  209  --  182  --   APTT 33  --   --   --   --   --   --   --   --   --   LABPROT 16.0*  --   --   --   --   --   --   --   --   --   INR 1.29  --   --   --   --   --   --   --   --   --   HEPARINUNFRC  --   --   --   --   < >  --  0.30 0.29* 0.24* 0.23*  CREATININE 1.67* 1.30* 1.11  --   --   --  0.82  --  0.57*  --   CKTOTAL 3,172*  --   --  9,735*  --  5,332* 5,971*  --   --   --   < > = values in this interval not displayed.  Estimated Creatinine Clearance: 72.6 mL/min (A) (by C-G formula based on SCr of 0.57 mg/dL (L)).   Assessment: Heparin for afib/ACS, heparin level sub-therapeutic  Goal of Therapy:  Heparin level 0.3-0.7 units/ml Monitor platelets by anticoagulation protocol: Yes   Plan:  Increase heparin to 1400 units/hr AM heparin level  Thank you Anette Guarneri, PharmD [phone]  07/27/2017,4:33 PM
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
## 2017-07-27 NOTE — Progress Notes (Signed)
Ortho Note:  I have reviewed CT scan of left shoulder.  His injury is Amenable to nonoperative management with close follow-up.  We will plan for continued nonweightbearing to the left arm in the sling for now.  Would like to initiate physical therapy approximate 3 weeks out from the injury.  I will attempt to convey this to the patient when he is more alert.   Call with questions we will follow from a far.

## 2017-07-27 NOTE — Progress Notes (Signed)
Attempted to call report, message left with secretary to give number to receiving RN.  Rowe Pavy, RN

## 2017-07-27 NOTE — Progress Notes (Signed)
DC'd foley at 1130, patient due to void, bladder scan shows >447, in and out attempted, met resistance and was unable to get any urine to return.   Rowe Pavy, RN

## 2017-07-27 NOTE — Progress Notes (Signed)
ANTICOAGULATION CONSULT NOTE - Follow Up Consult  Pharmacy Consult for Heparin  Indication: chest pain/ACS, atrial fibrillation   No Known Allergies  Patient Measurements: Height: 5\' 7"  (170.2 cm) Weight: 135 lb 9.3 oz (61.5 kg) IBW/kg (Calculated) : 66.1  Vital Signs: Temp: 99 F (37.2 C) (10/15 0400) Temp Source: Core (Comment) (10/15 0400) BP: 141/76 (10/15 0400)  Labs:  Recent Labs  07/25/17 1215 07/25/17 1237 07/25/17 1640 07/25/17 1800  07/26/17 0053 07/26/17 0629 07/26/17 1948 07/27/17 0407  HGB 8.4* 9.9*  --   --   --   --  7.5*  --   --   HCT 28.6* 29.0*  --   --   --   --  25.3*  --   --   PLT 324  --   --   --   --   --  209  --   --   APTT 33  --   --   --   --   --   --   --   --   LABPROT 16.0*  --   --   --   --   --   --   --   --   INR 1.29  --   --   --   --   --   --   --   --   HEPARINUNFRC  --   --   --   --   < >  --  0.30 0.29* 0.24*  CREATININE 1.67* 1.30* 1.11  --   --   --  0.82  --   --   CKTOTAL 3,172*  --   --  4,532*  --  5,332* 5,971*  --   --   < > = values in this interval not displayed.  Estimated Creatinine Clearance: 70.8 mL/min (by C-G formula based on SCr of 0.82 mg/dL).   Assessment: Heparin for afib/ACS, heparin level sub-therapeutic this AM on 1150 units/hr of heparin, no issues per RN.   Goal of Therapy:  Heparin level 0.3-0.7 units/ml Monitor platelets by anticoagulation protocol: Yes   Plan:  -Inc heparin to 1250 units/hr -1400 HL  Romeka Scifres 07/27/2017,5:32 AM

## 2017-07-27 NOTE — Progress Notes (Signed)
CRITICAL VALUE ALERT  Critical Value:  Trop 6.47  Date & Time Notied:  07/27/17 1702  Provider Notified: Junie Panning, NP cardiology  Orders Received/Actions taken: no new orders

## 2017-07-27 NOTE — Progress Notes (Signed)
[address] TEAM 1 - Stepdown/ICU TEAM  David ORTEZ  OEV:[id number] DOB: 05-29-1946 DOA: 07/25/2017 PCP: [name], MD    Brief Narrative:  71yo M with hx of anxiety, alcohol abuse, depression, lung cancer, and COPD for whom EMS was called the a.m. of 10/13 after the patient was found lying on the floor of his cold home by a friend. He had last been seen normal on 10/12 around noon. EMS noted ST elevation with left upper extremity trauma. He was found to be hypothermic and tremulous.  In the ED he complained of left arm pain, denied chest discomfort or shortness of breath. 12-lead showed atrial fibrillation, ST depression in V1 through V3, also concerns of nonspecific intraventricular conduction delay.  His initial temperature was 87.65F, he was bradycardic, and had rapid respiratory rate. His initial lactic acid was 16, hemoglobin 8. A chest x-ray demonstrated fracture of the proximal left humerus. His troponin was mildly elevated.  He was treated with warmed IV fluids, arm was immobilized, seen by Dr. Ellyn Hack who felt EKG was consistent with inferior MI. He recommended IV heparin, and supportive care for medical issues prior to further cardiac intervention. Given his degree of metabolic disarray critical care was asked to admit.  Significant Events: 10/13 - found down - admit to [facility] 10/14 TTE -   Subjective: The pt is alert but confused.  His speech is garbled and barely intelligible.  He c/o pain in his L shoulder.  He can not provide a ROS due to his delirium.    Assessment & Plan:  SIRS - no current clear source of infection SIRS physiology is improving   Acute inferior ST elevation myocardial infarction marked inferior ST elevation improved with hydration and warming - Cardiology to see for formal consult today - remains on IV heparin   Rhabdomyolysis  CK climbing - increase hydration - follow crt   Recent Labs Lab 07/25/17 1215 07/25/17 1800 07/26/17 0053  07/26/17 0629  CKTOTAL 3,172* 4,532* 5,332* 5,971*    Bradycardia Resolved   Atrial fibrillation  NSR at time of exam today - follow on tele   Severe lactic acidosis Due to above - lactate clearing w/ volume expansion  Acute kidney injury renal function has normalized w/ volume resuscitation   Hypokalemia Improved - follow trend   Severe malnutrition in setting of alcohol abuse Advance diet as mental status allows   Microcytic anemia Hgb is holding steady - Fe studies in AM  History of laryngeal and lung cancer records not currently available - reportedly treated 10 years ago     Hyperglycemia Check A1c   Acute delirium - History of alcohol abuse / EtOH withdrawal CIWA protocol  Left proximal humerus fracture Keep immobilized - for CT today - Ortho following   DVT prophylaxis: IV heparin  Code Status: FULL CODE Family Communication: no family present at time of exam  Disposition Plan: SDU  Consultants:  PCCM Orthopedics Cardiology   Antimicrobials:  Zosyn 10/13 > 10/15 Vanc 10/13 > 10/15  Objective: Blood pressure (!) 156/78, pulse 86, temperature 99.5 F (37.5 C), resp. rate 19, height 5\' 7"  (1.702 m), weight 61.5 kg (135 lb 9.3 oz), SpO2 98 %.  Intake/Output Summary (Last 24 hours) at 07/27/17 1116 Last data filed at 07/27/17 1000  Gross per 24 hour  Intake           3625.5 ml  Output              970  ml  Net           2655.5 ml   Filed Weights   07/25/17 1800 07/26/17 0500 07/27/17 0500  Weight: 58.6 kg (129 lb 3 oz) 61.1 kg (134 lb 11.2 oz) 61.5 kg (135 lb 9.3 oz)    Examination: General: No acute respiratory distress - delirious but alert  Lungs: Clear to auscultation bilaterally without wheezes or crackles Cardiovascular: Regular rate and rhythm without murmur  Abdomen: Nontender, nondistended, soft, bowel sounds positive, no rebound, no ascites, no appreciable mass Extremities: No significant cyanosis, clubbing, or edema bilateral  lower extremities  CBC:  Recent Labs Lab 07/25/17 1215 07/25/17 1237 07/26/17 0629 07/27/17 0407  WBC 18.8*  --  9.6 8.4  NEUTROABS 15.8*  --   --   --   HGB 8.4* 9.9* 7.5* 7.6*  HCT 28.6* 29.0* 25.3* 25.0*  MCV 76.1*  --  74.4* 74.0*  PLT 324  --  209 175   Basic Metabolic Panel:  Recent Labs Lab 07/25/17 1215 07/25/17 1237 07/25/17 1640 07/26/17 0629 07/26/17 1031 07/27/17 0407  NA 135 139 140 139  --  138  K 3.6 3.9 2.8* 3.4*  --  3.5  CL 99* 103 107 105  --  104  CO2 13*  --  19* 21*  --  24  GLUCOSE 123* 120* 123* 89  --  88  BUN 11 11 11 12   --  9  CREATININE 1.67* 1.30* 1.11 0.82  --  0.57*  CALCIUM 8.7*  --  8.3* 8.0*  --  7.8*  MG  --   --   --   --  1.8  --    GFR: Estimated Creatinine Clearance: 72.6 mL/min (A) (by C-G formula based on SCr of 0.57 mg/dL (L)).  Liver Function Tests:  Recent Labs Lab 07/25/17 1215 07/25/17 1640  AST 95* 110*  ALT 23 22  ALKPHOS 123 104  BILITOT 0.9 0.8  PROT 5.8* 5.2*  ALBUMIN 2.7* 2.6*    Coagulation Profile:  Recent Labs Lab 07/25/17 1215  INR 1.29    Cardiac Enzymes:  Recent Labs Lab 07/25/17 1215 07/25/17 1800 07/26/17 0053 07/26/17 0629  CKTOTAL 3,172* 4,532* 5,332* 5,971*    HbA1C: No results found for: HGBA1C    Recent Results (from the past 240 hour(s))  Culture, blood (routine x 2)     Status: None (Preliminary result)   Collection Time: 07/25/17 12:15 PM  Result Value Ref Range Status   Specimen Description BLOOD RIGHT ANTECUBITAL  Final   Special Requests   Final    BOTTLES DRAWN AEROBIC AND ANAEROBIC Blood Culture adequate volume   Culture NO GROWTH < 24 HOURS  Final   Report Status PENDING  Incomplete  MRSA PCR Screening     Status: None   Collection Time: 07/25/17  6:11 PM  Result Value Ref Range Status   MRSA by PCR NEGATIVE NEGATIVE Final    Comment:        The GeneXpert MRSA Assay (FDA approved for NASAL specimens only), is one component of a comprehensive MRSA  colonization surveillance program. It is not intended to diagnose MRSA infection nor to guide or monitor treatment for MRSA infections.      Scheduled Meds: . aspirin EC  325 mg Oral Daily  . folic acid  1 mg Intravenous Daily  . Influenza vac split quadrivalent PF  0.5 mL Intramuscular Tomorrow-1000  . LORazepam  0.5 mg Oral BID  . mouth rinse  15 mL Mouth Rinse BID  . pneumococcal 23 valent vaccine  0.5 mL Intramuscular Tomorrow-1000  . thiamine  100 mg Intravenous Daily  . traZODone  150 mg Oral QHS     LOS: 2 days   Cherene Altes, MD [facility] Office  [phone] Pager - Text Page per Amion as per below:  On-Call/Text Page:      [email or web address]      password [email or web address]  If 7PM-7AM, please contact night-coverage [email or web address] Password [email or web address] 07/27/2017, 11:16 AM

## 2017-07-27 NOTE — Consult Note (Signed)
Cardiology Consultation:   Patient ID: David Lucero; [id number]; [birth year]   Admit date: 07/25/2017 Date of Consult: 07/27/2017  Primary Care Provider: [name], MD Primary Cardiologist: remote Dr. Tamala Julian in 2015 Primary Electrophysiologist:  NA   Patient Profile:   David Lucero is a 71 y.o. male with a hx of neg nuc in 2015, depression, PTSD, COPD, lung cancer who is being seen today for the evaluation of abnormal EKG with ST elevation on arrival and hypothermic at the request of Dr. Thereasa Solo.  History of Present Illness:   David Lucero  with a hx of neg nuc in 2015, depression, PTSD, COPD, lung cancer admitted 07/25/17 was found in floor at home, where he lives alone. He was altered but responsive to voice and oriented to person and place.  He was hypothermic.      Telemetry:  Telemetry was personally reviewed.  Baseline with artifact but without the artifact he is in SR with PACs and PVCs.   EKGs were personally reviewed and demonstrated severe artifact but appeared to be ST elevation in inf leads.  It is read as a fib but too much artifact to tell.  The 4th EKG with ST elevation in inf leads and reciprocal changes in ant leads.   5th EKG with Q waves in II and III.  No further ST elevation.  He was hypothermic so warming was activated.  Dr. Ellyn Hack with Cards did see and recommended IV Heparin.   EKG today SR with inf Q waves new from March 2018   Troponin 0.08, CK 3,172. And up to 5,971.    K+ 3.6, though today 3.5 Cr 1.67 today 0.57, AST 95, ALT 23   Lactic acid 16.14 now 4.6 Hgb today 7.6   He has fx of Lt proximal humerus, with sling.  CXR no active disease.  CT head no acute intracranial abnormality. Small vessel ischemic changes, chronic Rt basal ganglia lacunar infarct.    Currently on IV heparin, Echo with normal EF, unable to understand him frequently, but he stated he did have chest pain at home.      Past Medical History:  Diagnosis Date  .  Acid reflux    from throat cancer  . Anxiety   . Cancer ([place])   . COPD (chronic obstructive pulmonary disease) ([place])   . ETOH abuse   . H/O ETOH abuse   . Lung cancer ([place])   . PAD (peripheral artery disease) ([place])   . PTSD (post-traumatic stress disorder)     History reviewed. No pertinent surgical history.   Home Medications:  Prior to Admission medications   Medication Sig Start Date End Date Taking? Authorizing Provider  acetaminophen (TYLENOL) 500 MG tablet Take 1,000 mg by mouth every 6 (six) hours as needed for moderate pain.    Yes [provider]  LORazepam (ATIVAN) 0.5 MG tablet Take 1 tablet (0.5 mg total) by mouth at bedtime. Patient taking differently: Take 0.5 mg by mouth daily as needed for anxiety.  03/18/17  Yes Pollina, Gwenyth Allegra, MD  traZODone (DESYREL) 150 MG tablet Take 150 mg by mouth at bedtime.   Yes [provider]  LORazepam (ATIVAN) 0.5 MG tablet Take 1 tablet (0.5 mg total) by mouth 2 (two) times daily. Patient not taking: Reported on 03/28/2017 01/10/17   Ripley Fraise, MD    Inpatient Medications: Scheduled Meds: . aspirin EC  325 mg Oral Daily  . folic acid  1 mg Intravenous Daily  .  Influenza vac split quadrivalent PF  0.5 mL Intramuscular Tomorrow-1000  . LORazepam  0.5 mg Oral BID  . mouth rinse  15 mL Mouth Rinse BID  . pneumococcal 23 valent vaccine  0.5 mL Intramuscular Tomorrow-1000  . thiamine  100 mg Intravenous Daily  . traZODone  150 mg Oral QHS   Continuous Infusions: . sodium chloride 250 mL (07/27/17 0900)  . heparin 1,250 Units/hr (07/27/17 0900)  . lactated ringers 100 mL/hr at 07/27/17 0900  . piperacillin-tazobactam (ZOSYN)  IV Stopped (07/27/17 1009)  . vancomycin Stopped (07/27/17 0958)   PRN Meds: sodium chloride, LORazepam, morphine injection  Allergies:   No Known Allergies  Social History:   Social History   Social History  . Marital status: Single    Spouse name: N/A  . Number of  children: N/A  . Years of education: N/A   Occupational History  . Not on file.   Social History Main Topics  . Smoking status: Current Every Day Smoker    Packs/day: 1.00    Years: 30.00    Types: Cigarettes  . Smokeless tobacco: Never Used  . Alcohol use 50.4 oz/week    84 Cans of beer per week     Comment: 12 or more beers/day  . Drug use: No  . Sexual activity: Not on file   Other Topics Concern  . Not on file   Social History Narrative  . No narrative on file    Family History:    Family History  Problem Relation Age of Onset  . Heart attack Father      ROS:  Please see the history of present illness.  ROS  General:no colds or fevers, no weight changes as best I could understand Skin:no rashes or ulcers HEENT:no blurred vision, no congestion CV:see HPI PUL:see HPI GI:no diarrhea constipation or melena, no indigestion GU:no hematuria, no dysuria MS:no joint pain, no claudication Neuro:no syncope, no lightheadedness Endo:no diabetes, no thyroid disease    difficult to understand what pt is saying  Physical Exam/Data:   Vitals:   07/27/17 0600 07/27/17 0700 07/27/17 0800 07/27/17 0900  BP: (!) 149/58 (!) 88/60 116/79 132/88  Pulse:      Resp: 19 19 18  (!) 21  Temp: 99.1 F (37.3 C) 99.5 F (37.5 C) 99.1 F (37.3 C) 99.5 F (37.5 C)  TempSrc:      SpO2: 96% 97% 98% 98%  Weight:      Height:        Intake/Output Summary (Last 24 hours) at 07/27/17 1045 Last data filed at 07/27/17 0900  Gross per 24 hour  Intake             3503 ml  Output              970 ml  Net             2533 ml   Filed Weights   07/25/17 1800 07/26/17 0500 07/27/17 0500  Weight: 129 lb 3 oz (58.6 kg) 134 lb 11.2 oz (61.1 kg) 135 lb 9.3 oz (61.5 kg)   Body mass index is 21.24 kg/m.  General:  Thin frail male in no acute distress HEENT: normal, I do not understand his speech. Lymph: no adenopathy Neck: no JVD Endocrine:  No thryomegaly Vascular: No carotid bruits;  1+ pedal pulses bil Cardiac:  normal S1, S2; RRR; no murmur gallup rub or click Lungs:  clear to auscultation bilaterally, no wheezing, rhonchi or rales  Abd: soft,  nontender, no hepatomegaly  Ext: no edema Musculoskeletal:  No deformities, BUE and BLE strength normal and equal Skin: warm and dry  Neuro:  Awake, follows commands, I do not understand what he says. Psych:  Normal affect  GU: No hematuria.    Relevant CV Studies: Echo 07/26/17 Study Conclusions  - Procedure narrative: Transthoracic echocardiography. Image   quality was poor. The study was technically difficult, as a   result of poor acoustic windows, poor patient compliance, and   restricted patient mobility, and a broken left arm in a sling - Left ventricle: Grossly normal LV function based on limited   subcostal views. Systolic function was normal. The estimated   ejection fraction was in the range of 60% to 65%. - Aorta: Poorly visualied - calcified leafelts, possible stenosis. - Right ventricle: The cavity size was mildly dilated. Mild   hypokinesis.  Impressions:  - Very technically difficult study. LVEF 60-65 based on limited   subcostal views - there is a suggestion of aortic valve sclerosis   or calcification and possible stenosis - colinear dopplers could   not be performed. Recommend alternative imaging modality such as   TEE or cMRI to further evaluate.   Laboratory Data:  Chemistry Recent Labs Lab 07/25/17 1640 07/26/17 0629 07/27/17 0407  NA 140 139 138  K 2.8* 3.4* 3.5  CL 107 105 104  CO2 19* 21* 24  GLUCOSE 123* 89 88  BUN 11 12 9   CREATININE 1.11 0.82 0.57*  CALCIUM 8.3* 8.0* 7.8*  GFRNONAA >60 >60 >60  GFRAA >60 >60 >60  ANIONGAP 14 13 10      Recent Labs Lab 07/25/17 1215 07/25/17 1640  PROT 5.8* 5.2*  ALBUMIN 2.7* 2.6*  AST 95* 110*  ALT 23 22  ALKPHOS 123 104  BILITOT 0.9 0.8   Hematology Recent Labs Lab 07/25/17 1215 07/25/17 1237 07/26/17 0629  07/27/17 0407  WBC 18.8*  --  9.6 8.4  RBC 3.76*  --  3.40* 3.38*  HGB 8.4* 9.9* 7.5* 7.6*  HCT 28.6* 29.0* 25.3* 25.0*  MCV 76.1*  --  74.4* 74.0*  MCH 22.3*  --  22.1* 22.5*  MCHC 29.4*  --  29.6* 30.4  RDW 20.0*  --  19.6* 19.8*  PLT 324  --  209 182   Cardiac EnzymesNo results for input(s): TROPONINI in the last 168 hours.  Recent Labs Lab 07/25/17 1231  TROPIPOC 0.08    BNPNo results for input(s): BNP, PROBNP in the last 168 hours.  DDimer No results for input(s): DDIMER in the last 168 hours.  Radiology/Studies:  Ct Head Wo Contrast  Result Date: 07/25/2017 CLINICAL DATA:  Found down, possible STEMI EXAM: CT HEAD WITHOUT CONTRAST TECHNIQUE: Contiguous axial images were obtained from the base of the skull through the vertex without intravenous contrast. COMPARISON:  04/30/2014 FINDINGS: Brain: No evidence of acute infarction, hemorrhage, hydrocephalus, extra-axial collection or mass lesion/mass effect. Subcortical white matter and periventricular small vessel ischemic changes. Chronic right basal ganglia lacunar infarct. Bilateral basal ganglia calcifications. Vascular: Intracranial atherosclerosis. Skull: Normal. Negative for fracture or focal lesion. Sinuses/Orbits: The visualized paranasal sinuses are essentially clear. The mastoid air cells are unopacified. Other: None. IMPRESSION: No evidence of acute intracranial abnormality. Small vessel ischemic changes. Chronic right basal ganglia lacunar infarct. Electronically Signed   By: Julian Hy M.D.   On: 07/25/2017 13:25   Dg Chest Port 1 View  Result Date: 07/26/2017 CLINICAL DATA:  Atelectasis. EXAM: PORTABLE CHEST 1 VIEW COMPARISON:  Radiograph of July 25, 2017. FINDINGS: The heart size and mediastinal contours are within normal limits. Both lungs are clear. No pneumothorax or pleural effusion is noted. Comminuted proximal left humeral fracture is again noted. Old left rib fractures are noted. IMPRESSION: Continued  presence of comminuted proximal left humeral fracture. No acute cardiopulmonary abnormality seen. Electronically Signed   By: Marijo Conception, M.D.   On: 07/26/2017 07:10   Dg Chest Port 1 View  Result Date: 07/25/2017 CLINICAL DATA:  Found lying on the floor and hypothermic. EXAM: PORTABLE CHEST 1 VIEW COMPARISON:  08/21/2015 FINDINGS: Cardiomediastinal silhouette is normal. Mediastinal contours appear intact. Tortuosity and calcific atherosclerotic disease of the aorta. There is no evidence of focal airspace consolidation, pleural effusion or pneumothorax. Osseous structures are without acute abnormality. Soft tissues are grossly normal. IMPRESSION: No active disease. Electronically Signed   By: Fidela Salisbury M.D.   On: 07/25/2017 12:49   Dg Shoulder Left Portable  Result Date: 07/25/2017 CLINICAL DATA:  Left shoulder pain since a fall today. Initial encounter. EXAM: LEFT SHOULDER - 1 VIEW COMPARISON:  Plain films left humerus this same day. FINDINGS: Comminuted surgical neck fracture of the left humerus is identified. There is fragment override and approximately 1 shaft width anterior displacement the distal fragment. The fracture likely involves the greater tuberosity. Remote healed left clavicle fracture is noted. Imaged left lung and ribs are clear. IMPRESSION: Acute, comminuted fracture of the surgical neck of the left humerus as described. Electronically Signed   By: Inge Rise M.D.   On: 07/25/2017 15:13   Dg Humerus Left  Result Date: 07/25/2017 CLINICAL DATA:  Left upper arm deformity after fall yesterday. EXAM: LEFT HUMERUS - 2+ VIEW COMPARISON:  None. FINDINGS: Moderately displaced and comminuted fracture is seen involving the proximal left humeral head and neck. Old distal left clavicular fracture is noted as well. IMPRESSION: Moderately displaced and comminuted proximal left humeral head and neck fracture is noted. Electronically Signed   By: Marijo Conception, M.D.   On:  07/25/2017 12:51    Assessment and Plan:   1. STEMI/Abnormal EKG on admit 07/25/17 with ST elevation.  Possible coronary spasm with hypothermia --EKG changes improved with re-warming though does have Q waves now - No cath at the time due to co morbidities.  Today EKG with Q waves in inf leads no MB or follow up troponin noted.  On IV heparin- will check troponin and CKMB.  EF is normal on Echo  Dr. Burt Knack to see.  Will check troponin and CKMB - possible cath once improved, as kidney function is stable.  2.         Atrial fib so much artifact difficult to tell on admit, but one EKG revealed SR.  Now SR still with artifact on leads freq.   --pt placed on IV Heparin --with pt's ETOH use would not anticoagulate even if does have a fib . CAH2DS2VASc is 1  3.          AKI prob rhabdomyolysis with elevated CK, now Cr. improved  4.          Acute anemia followed by CCM  5.          Tobacco abuse will review stopping when pt more alert  6.           ETOH use would recommend stopping  For questions or updates, please contact [place] Please consult [email or web address] for contact info under Cardiology/STEMI.   Signed, Mickel Baas  Dorene Ar, NP  07/27/2017 10:45 AM  Patient seen, examined. Available data reviewed. Agree with findings, assessment, and plan as outlined by Cecilie Kicks, NP-C. patient is independently interviewed and examined. He is an elderly male who is confused. He is in no acute distress. I have difficulty understanding his speech and history is very limited. He denies pain except for as left shoulder/upper arm. Lung fields are clear, JVP is normal, heart is irregular without murmur or gallop, abdomen is soft and nontender, extremities show no edema. The patient is thin. He is on the CIWA protocol.   I have reviewed the patient's EKGs and he had marked inferior ST elevation on October 13 in the setting of severe illness/metabolic derangement. The patient's blood lactate was 16 at the time  of his EKG changes. He was found down in his home and was hypothermic. He has rhabdomyolysis based on marked elevation of CK. I agree that adding an MB fraction and troponin will be helpful to evaluate degree of myocardial injury. The patient did not have a wall motion abnormality on his echo and overall LV function was normal. However, he did have poor acoustic windows. Regardless of findings from his blood tests, I am not sure that he is going to be a candidate for cardiac catheterization and PCI. Left follow his clinical course and see to watch his confusion improves. Would continue IV heparin until further cardiac enzymes are resulted. It might ultimately be best to treat him medically. Will add a statin and beta-blocker to his regimen. Continue ASA. Start plavix 75 mg daily. Will follow with you, thanks.  Sherren Mocha, M.D. 07/27/2017 12:40 PM

## 2017-07-28 DIAGNOSIS — R748 Abnormal levels of other serum enzymes: Secondary | ICD-10-CM

## 2017-07-28 LAB — POTASSIUM: POTASSIUM: 4 mmol/L (ref 3.5–5.1)

## 2017-07-28 LAB — CBC
HEMATOCRIT: 22.6 % — AB (ref 39.0–52.0)
Hemoglobin: 6.7 g/dL — CL (ref 13.0–17.0)
MCH: 21.9 pg — ABNORMAL LOW (ref 26.0–34.0)
MCHC: 29.6 g/dL — ABNORMAL LOW (ref 30.0–36.0)
MCV: 73.9 fL — AB (ref 78.0–100.0)
Platelets: 239 10*3/uL (ref 150–400)
RBC: 3.06 MIL/uL — AB (ref 4.22–5.81)
RDW: 19.3 % — ABNORMAL HIGH (ref 11.5–15.5)
WBC: 7.1 10*3/uL (ref 4.0–10.5)

## 2017-07-28 LAB — HEMOGLOBIN AND HEMATOCRIT, BLOOD
HCT: 28.1 % — ABNORMAL LOW (ref 39.0–52.0)
Hemoglobin: 8.6 g/dL — ABNORMAL LOW (ref 13.0–17.0)

## 2017-07-28 LAB — RETICULOCYTES
RBC.: 3.06 MIL/uL — ABNORMAL LOW (ref 4.22–5.81)
Retic Count, Absolute: 58.1 10*3/uL (ref 19.0–186.0)
Retic Ct Pct: 1.9 % (ref 0.4–3.1)

## 2017-07-28 LAB — TYPE AND SCREEN
ABO/RH(D): O POS
ANTIBODY SCREEN: NEGATIVE

## 2017-07-28 LAB — COMPREHENSIVE METABOLIC PANEL
ALBUMIN: 2 g/dL — AB (ref 3.5–5.0)
ALT: 30 U/L (ref 17–63)
AST: 81 U/L — AB (ref 15–41)
Alkaline Phosphatase: 112 U/L (ref 38–126)
Anion gap: 9 (ref 5–15)
BUN: 5 mg/dL — AB (ref 6–20)
CHLORIDE: 105 mmol/L (ref 101–111)
CO2: 24 mmol/L (ref 22–32)
CREATININE: 0.53 mg/dL — AB (ref 0.61–1.24)
Calcium: 7.4 mg/dL — ABNORMAL LOW (ref 8.9–10.3)
GFR calc non Af Amer: >60 mL/min (ref >60)
Glucose, Bld: 97 mg/dL (ref 65–99)
Potassium: 2.7 mmol/L — CL (ref 3.5–5.1)
SODIUM: 138 mmol/L (ref 135–145)
TOTAL PROTEIN: 4.6 g/dL — AB (ref 6.5–8.1)
Total Bilirubin: 0.7 mg/dL (ref 0.3–1.2)

## 2017-07-28 LAB — IRON AND TIBC
IRON: 8 ug/dL — AB (ref 45–182)
SATURATION RATIOS: 4 % — AB (ref 17.9–39.5)
TIBC: 225 ug/dL — AB (ref 250–450)
UIBC: 217 ug/dL

## 2017-07-28 LAB — CK: CK TOTAL: 1107 U/L — AB (ref 49–397)

## 2017-07-28 LAB — HEPARIN LEVEL (UNFRACTIONATED)
Heparin Unfractionated: 0.23 IU/mL — ABNORMAL LOW (ref 0.30–0.70)
Heparin Unfractionated: 0.22 IU/mL — ABNORMAL LOW (ref 0.30–0.70)

## 2017-07-28 LAB — LIPID PANEL
CHOL/HDL RATIO: 5 ratio
CHOLESTEROL: 105 mg/dL (ref 0–200)
HDL: 21 mg/dL — AB (ref >40)
LDL Cholesterol: 63 mg/dL (ref 0–99)
Triglycerides: 106 mg/dL (ref <150)
VLDL: 21 mg/dL (ref 0–40)

## 2017-07-28 LAB — HEMOGLOBIN A1C
Hgb A1c MFr Bld: 5.5 % (ref 4.8–5.6)
Mean Plasma Glucose: 111.15 mg/dL

## 2017-07-28 LAB — FERRITIN: Ferritin: 53 ng/mL (ref 24–336)

## 2017-07-28 LAB — FOLATE: FOLATE: 12.3 ng/mL (ref >5.9)

## 2017-07-28 LAB — VITAMIN B12: Vitamin B-12: 479 pg/mL (ref 180–914)

## 2017-07-28 LAB — PREPARE RBC (CROSSMATCH)

## 2017-07-28 MED ORDER — SODIUM CHLORIDE 0.9 % IV SOLN
Freq: Once | INTRAVENOUS | Status: AC
  Administered 2017-07-28: 05:00:00 via INTRAVENOUS

## 2017-07-28 MED ORDER — POTASSIUM CHLORIDE 10 MEQ/100ML IV SOLN
10.0000 meq | INTRAVENOUS | Status: AC
  Administered 2017-07-28 (×6): 10 meq via INTRAVENOUS
  Filled 2017-07-28 (×6): qty 100

## 2017-07-28 MED ORDER — POTASSIUM CHLORIDE 10 MEQ/100ML IV SOLN: 10.0000 meq | INTRAVENOUS | Status: DC

## 2017-07-28 NOTE — Progress Notes (Signed)
1st attempt to call report. Receiving nurse is taking an admission. Will call back when available.

## 2017-07-28 NOTE — Progress Notes (Signed)
Progress Note  Patient Name: David Lucero Date of Encounter: 07/28/2017  Primary Cardiologist: Tamala Julian  Subjective   Pt is difficult to understand but seems to deny SOB or chest pain  Inpatient Medications    Scheduled Meds: . aspirin EC  325 mg Oral Daily  . atorvastatin  40 mg Oral q1800  . chlorhexidine  15 mL Mouth Rinse BID  . clopidogrel  75 mg Oral Daily  . folic acid  1 mg Intravenous Daily  . Influenza vac split quadrivalent PF  0.5 mL Intramuscular Tomorrow-1000  . LORazepam  0.5 mg Oral BID  . mouth rinse  15 mL Mouth Rinse BID  . mouth rinse  15 mL Mouth Rinse q12n4p  . metoprolol tartrate  2.5 mg Intravenous Q6H  . pneumococcal 23 valent vaccine  0.5 mL Intramuscular Tomorrow-1000  . thiamine  100 mg Intravenous Daily  . traZODone  150 mg Oral QHS   Continuous Infusions: . heparin 1,550 Units/hr (07/28/17 0900)  . lactated ringers 110 mL/hr at 07/28/17 1034  . potassium chloride 10 mEq (07/28/17 1033)   PRN Meds: LORazepam, morphine injection   Vital Signs    Vitals:   07/28/17 0745 07/28/17 0800 07/28/17 0900 07/28/17 1000  BP:  107/60 124/83 (!) 139/55  Pulse:      Resp:  16 18 15   Temp: (!) 97.4 F (36.3 C)     TempSrc: Axillary     SpO2:  98% 100% 96%  Weight:      Height:        Intake/Output Summary (Last 24 hours) at 07/28/17 1128 Last data filed at 07/28/17 0812  Gross per 24 hour  Intake          2946.62 ml  Output             1300 ml  Net          1646.62 ml   Filed Weights   07/26/17 0500 07/27/17 0500 07/28/17 0500  Weight: 134 lb 11.2 oz (61.1 kg) 135 lb 9.3 oz (61.5 kg) 136 lb 11 oz (62 kg)    Telemetry    Sinus, PACs - Personally Reviewed  ECG    No am ekg  Physical Exam   GEN: somnolent, awake, confused, difficult to understand Neck: No JVD Cardiac: RRR with ectopy. No loud murmurs noted.   Respiratory: Clear to auscultation bilaterally. GI: Soft, nontender, non-distended  Ext: No LE edema; No  deformity. Neuro: unable to assess Psych: confused  Labs    Chemistry Recent Labs Lab 07/25/17 1215  07/25/17 1640 07/26/17 0629 07/27/17 0407 07/28/17 0234  NA 135  < > 140 139 138 138  K 3.6  < > 2.8* 3.4* 3.5 2.7*  CL 99*  < > 107 105 104 105  CO2 13*  --  19* 21* 24 24  GLUCOSE 123*  < > 123* 89 88 97  BUN 11  < > 11 12 9  5*  CREATININE 1.67*  < > 1.11 0.82 0.57* 0.53*  CALCIUM 8.7*  --  8.3* 8.0* 7.8* 7.4*  PROT 5.8*  --  5.2*  --   --  4.6*  ALBUMIN 2.7*  --  2.6*  --   --  2.0*  AST 95*  --  110*  --   --  81*  ALT 23  --  22  --   --  30  ALKPHOS 123  --  104  --   --  112  BILITOT 0.9  --  0.8  --   --  0.7  GFRNONAA 39*  --  >60 >60 >60 >60  GFRAA 46*  --  >60 >60 >60 >60  ANIONGAP 23*  --  14 13 10 9   < > = values in this interval not displayed.   Hematology Recent Labs Lab 07/26/17 0629 07/27/17 0407 07/28/17 0234 07/28/17 0823  WBC 9.6 8.4 7.1  --   RBC 3.40* 3.38* 3.06*  3.06*  --   HGB 7.5* 7.6* 6.7* 8.6*  HCT 25.3* 25.0* 22.6* 28.1*  MCV 74.4* 74.0* 73.9*  --   MCH 22.1* 22.5* 21.9*  --   MCHC 29.6* 30.4 29.6*  --   RDW 19.6* 19.8* 19.3*  --   PLT 209 182 239  --     Cardiac Enzymes Recent Labs Lab 07/27/17 1530  TROPONINI 6.47*    Recent Labs Lab 07/25/17 1231  TROPIPOC 0.08     BNPNo results for input(s): BNP, PROBNP in the last 168 hours.   DDimer No results for input(s): DDIMER in the last 168 hours.   Radiology    Ct Shoulder Left Wo Contrast  Result Date: 07/27/2017 CLINICAL DATA:  Humerus fracture. EXAM: CT OF THE UPPER LEFT EXTREMITY WITHOUT CONTRAST TECHNIQUE: Multidetector CT imaging of the upper left extremity was performed according to the standard protocol. COMPARISON:  Left shoulder x-rays dated July 25, 2017. FINDINGS: Bones/Joint/Cartilage Again seen is a comminuted fracture of the left humerus surgical neck. The fracture extends into the base of the greater tuberosity. There is approximately 1 cm of anterior  displacement of the distal fragment, and 2-3 cm of overriding fragments. Old healed left clavicle and rib fractures. Mild degenerative changes of the acromioclavicular joint. Ligaments Suboptimally assessed by CT. Muscles and Tendons No focal abnormality. Soft tissues Small amount of hematoma surrounding the fracture, extending into the left axilla. Mild soft tissue edema about the left shoulder. Mild left upper lobe centrilobular and paraseptal emphysema. IMPRESSION: 1. Mildly displaced, comminuted, impacted fracture of the left humerus surgical neck extending into the base of the greater tuberosity. Electronically Signed   By: Titus Dubin M.D.   On: 07/27/2017 14:37    Cardiac Studies   Echo 07/26/17: - Procedure narrative: Transthoracic echocardiography. Image   quality was poor. The study was technically difficult, as a   result of poor acoustic windows, poor patient compliance, and   restricted patient mobility, and a broken left arm in a sling - Left ventricle: Grossly normal LV function based on limited   subcostal views. Systolic function was normal. The estimated   ejection fraction was in the range of 60% to 65%. - Aorta: Poorly visualied - calcified leafelts, possible stenosis. - Right ventricle: The cavity size was mildly dilated. Mild   hypokinesis.  Impressions:  - Very technically difficult study. LVEF 60-65 based on limited   subcostal views - there is a suggestion of aortic valve sclerosis   or calcification and possible stenosis - colinear dopplers could   not be performed. Recommend alternative imaging modality such as   TEE or cMRI to further evaluate.  Patient Profile     71 y.o. male with history of lung cancer, COPD found down at home and found to have rhabdomyolysis and lactic acidosis. He was hypothermic on presentation and there were EKG changes c/w injury in the setting of profound metabolic derangement. Troponin is 6.47.   Assessment & Plan    1.  Troponin elevation: He  has an elevated troponin in the setting of profound metabolic derangement with lactic acidosis, rhabdomyolysis and hypothermia. EKG did demonstrate possible injury current on admission but this has resolved. He has no c/o chest pain. There is a high likelihood of obstructive CAD but he is not a good candidate for cardiac cath right now with metabolic derangement including profound anemia and no clear bleeding source. Would continue medical management for now. No clear bleeding source. He is on ASA, Plavix and IV heparin. Would follow H/H closely and consider stopping IV heparin and Plavix if further drop in Hgb. Continue statin and beta blocker.   We will follow with you.   For questions or updates, please contact [place] Please consult [email or web address] for contact info under Cardiology/STEMI.      Signed, Lauree Chandler, MD  07/28/2017, 11:28 AM

## 2017-07-28 NOTE — Progress Notes (Signed)
[place] TEAM 1 - Stepdown/ICU TEAM  David Lucero  TIW:[id number] DOB: 1946/07/29 DOA: 07/25/2017 PCP: [name], MD    Brief Narrative:  71yo M with hx of anxiety, alcohol abuse, depression, lung cancer, and COPD for whom EMS was called the a.m. of 10/13 after the patient was found lying on the floor of his cold home by a friend. He had last been seen normal on 10/12 around noon. EMS noted ST elevation with left upper extremity trauma. He was found to be hypothermic and tremulous.  In the ED he complained of left arm pain, denied chest discomfort or shortness of breath. 12-lead showed atrial fibrillation, ST depression in V1 through V3, also concerns of nonspecific intraventricular conduction delay.  His initial temperature was 87.50F, he was bradycardic, and had rapid respiratory rate. His initial lactic acid was 16, hemoglobin 8. A chest x-ray demonstrated fracture of the proximal left humerus. His troponin was mildly elevated.  He was treated with warmed IV fluids, arm was immobilized, seen by Dr. Ellyn Hack who felt EKG was consistent with inferior MI. He recommended IV heparin, and supportive care for medical issues prior to further cardiac intervention. Given his degree of metabolic disarray critical care was asked to admit.  Significant Events: 10/13 - found down - admit to [facility] 10/14 TTE -   Subjective: The pt is very sedate.  He is not able to provide a reliable hx at this time.  He does not appear to be uncomfortable or in acute resp distress.    Assessment & Plan:  SIRS - no current clear source of infection SIRS physiology resolved at this time   Acute inferior ST elevation myocardial infarction marked inferior ST elevation improved with hydration and warming - Cardiology following - at this time medical management only appears to be the most appropriate - TTE w/o evidence of signif LV impairment   Rhabdomyolysis  CK appears to now be declining - cont to hydrate  and follow    Recent Labs Lab 07/25/17 1800 07/26/17 0053 07/26/17 0629 07/27/17 1530 07/28/17 0234  CKTOTAL 4,532* 5,332* 5,971* 2,172* 1,107*    Bradycardia Resolved   Atrial fibrillation  Appears to be maintaining NSR for now - follow on tele   Severe lactic acidosis Appears to be resolved   Acute kidney injury renal function has normalized w/ volume resuscitation   Acute urinary retention Unable to void post foley removal - replace foley - trial of flomax +/- urecholine when more alert before attempt to remove foley again   Hypokalemia likley has signif total body deficit due to poor nutrition - cont to supplement   Severe malnutrition in setting of alcohol abuse Advance diet as mental status allows - not presently safe to begin diet due to AMS  Microcytic anemia Hgb has drifted down - unclear if this is simply due to dilution, or indicative of blood loss in setting of anticoag use - Fe studies suggest severe malnutrition associated anemia - transfused 1U PRBC today   History of laryngeal and lung cancer records not currently available - reportedly treated 10 years ago     Hyperglycemia A1c not c/w DM   Acute delirium - History of alcohol abuse / EtOH withdrawal CIWA protocol - hx from neighbors/friends suggest a baseline of cognitive decline, perhaps due to EtOH, or possibly dementia of other cause - follow mental state   Left proximal humerus fracture Keep immobilized - Ortho reports wound is amenable to non-operative tx - non-weightbearing  w/ continued use of sling   DVT prophylaxis: IV heparin  Code Status: FULL CODE Family Communication: no family present at time of exam  Disposition Plan: SDU  Consultants:  PCCM Orthopedics Cardiology   Antimicrobials:  Zosyn 10/13 > 10/15 Vanc 10/13 > 10/15  Objective: Blood pressure (!) 139/55, pulse 73, temperature (!) 97.4 F (36.3 C), temperature source Axillary, resp. rate 15, height 5\' 7"  (7.867 m),  weight 62 kg (136 lb 11 oz), SpO2 96 %.  Intake/Output Summary (Last 24 hours) at 07/28/17 1128 Last data filed at 07/28/17 0812  Gross per 24 hour  Intake          2946.62 ml  Output             1300 ml  Net          1646.62 ml   Filed Weights   07/26/17 0500 07/27/17 0500 07/28/17 0500  Weight: 61.1 kg (134 lb 11.2 oz) 61.5 kg (135 lb 9.3 oz) 62 kg (136 lb 11 oz)    Examination: General: No acute respiratory distress - sedate/confused  Lungs: Clear to auscultation bilaterally  - no wheezing  Cardiovascular: Regular rate and rhythm - no M  Abdomen: NT/ND, soft, bowel sounds positive, no rebound Extremities: No edema bilateral lower extremities  CBC:  Recent Labs Lab 07/25/17 1215 07/25/17 1237 07/26/17 0629 07/27/17 0407 07/28/17 0234 07/28/17 0823  WBC 18.8*  --  9.6 8.4 7.1  --   NEUTROABS 15.8*  --   --   --   --   --   HGB 8.4* 9.9* 7.5* 7.6* 6.7* 8.6*  HCT 28.6* 29.0* 25.3* 25.0* 22.6* 28.1*  MCV 76.1*  --  74.4* 74.0* 73.9*  --   PLT 324  --  209 182 239  --    Basic Metabolic Panel:  Recent Labs Lab 07/25/17 1215 07/25/17 1237 07/25/17 1640 07/26/17 0629 07/26/17 1031 07/27/17 0407 07/28/17 0234  NA 135 139 140 139  --  138 138  K 3.6 3.9 2.8* 3.4*  --  3.5 2.7*  CL 99* 103 107 105  --  104 105  CO2 13*  --  19* 21*  --  24 24  GLUCOSE 123* 120* 123* 89  --  88 97  BUN 11 11 11 12   --  9 5*  CREATININE 1.67* 1.30* 1.11 0.82  --  0.57* 0.53*  CALCIUM 8.7*  --  8.3* 8.0*  --  7.8* 7.4*  MG  --   --   --   --  1.8  --   --    GFR: Estimated Creatinine Clearance: 73.2 mL/min (A) (by C-G formula based on SCr of 0.53 mg/dL (L)).  Liver Function Tests:  Recent Labs Lab 07/25/17 1215 07/25/17 1640 07/28/17 0234  AST 95* 110* 81*  ALT 23 22 30   ALKPHOS 123 104 112  BILITOT 0.9 0.8 0.7  PROT 5.8* 5.2* 4.6*  ALBUMIN 2.7* 2.6* 2.0*    Coagulation Profile:  Recent Labs Lab 07/25/17 1215  INR 1.29    Cardiac Enzymes:  Recent Labs Lab  07/25/17 1800 07/26/17 0053 07/26/17 0629 07/27/17 1530 07/28/17 0234  CKTOTAL 4,532* 5,332* 5,971* 2,172* 1,107*  CKMB  --   --   --  12.4*  --   TROPONINI  --   --   --  6.47*  --     HbA1C: Hgb A1c MFr Bld  Date/Time Value Ref Range Status  07/28/2017 02:35 AM 5.5 4.8 -  5.6 % Final    Comment:    (NOTE) Pre diabetes:          5.7%-6.4% Diabetes:              >6.4% Glycemic control for   <7.0% adults with diabetes       Recent Results (from the past 240 hour(s))  Culture, blood (routine x 2)     Status: None (Preliminary result)   Collection Time: 07/25/17 12:15 PM  Result Value Ref Range Status   Specimen Description BLOOD RIGHT ANTECUBITAL  Final   Special Requests   Final    BOTTLES DRAWN AEROBIC AND ANAEROBIC Blood Culture adequate volume   Culture NO GROWTH 2 DAYS  Final   Report Status PENDING  Incomplete  Urine culture     Status: None   Collection Time: 07/25/17  2:40 PM  Result Value Ref Range Status   Specimen Description URINE, CATHETERIZED  Final   Special Requests NONE  Final   Culture NO GROWTH  Final   Report Status 07/27/2017 FINAL  Final  MRSA PCR Screening     Status: None   Collection Time: 07/25/17  6:11 PM  Result Value Ref Range Status   MRSA by PCR NEGATIVE NEGATIVE Final    Comment:        The GeneXpert MRSA Assay (FDA approved for NASAL specimens only), is one component of a comprehensive MRSA colonization surveillance program. It is not intended to diagnose MRSA infection nor to guide or monitor treatment for MRSA infections.   Culture, blood (routine x 2)     Status: None (Preliminary result)   Collection Time: 07/25/17  6:34 PM  Result Value Ref Range Status   Specimen Description BLOOD RIGHT HAND  Final   Special Requests IN PEDIATRIC BOTTLE Blood Culture adequate volume  Final   Culture NO GROWTH 1 DAY  Final   Report Status PENDING  Incomplete     Scheduled Meds: . aspirin EC  325 mg Oral Daily  . atorvastatin  40 mg  Oral q1800  . chlorhexidine  15 mL Mouth Rinse BID  . clopidogrel  75 mg Oral Daily  . folic acid  1 mg Intravenous Daily  . Influenza vac split quadrivalent PF  0.5 mL Intramuscular Tomorrow-1000  . LORazepam  0.5 mg Oral BID  . mouth rinse  15 mL Mouth Rinse BID  . mouth rinse  15 mL Mouth Rinse q12n4p  . metoprolol tartrate  2.5 mg Intravenous Q6H  . pneumococcal 23 valent vaccine  0.5 mL Intramuscular Tomorrow-1000  . thiamine  100 mg Intravenous Daily  . traZODone  150 mg Oral QHS     LOS: 3 days   Cherene Altes, MD [facility] Office  [phone] Pager - Text Page per Amion as per below:  On-Call/Text Page:      [email or web address]      password [email or web address]  If 7PM-7AM, please contact night-coverage [email or web address] Password [email or web address] 07/28/2017, 11:28 AM

## 2017-07-28 NOTE — Evaluation (Signed)
Clinical/Bedside Swallow Evaluation Patient Details  Name: David Lucero MRN: [id number] Date of Birth: 02-08-1946  Today's Date: 07/28/2017 Time: SLP Start Time (ACUTE ONLY): 0945 SLP Stop Time (ACUTE ONLY): 1007 SLP Time Calculation (min) (ACUTE ONLY): 22 min  Past Medical History:  Past Medical History:  Diagnosis Date  . Acid reflux    from throat cancer  . Anxiety   . Cancer ([place])   . COPD (chronic obstructive pulmonary disease) ([place])   . ETOH abuse   . H/O ETOH abuse   . Lung cancer ([place])   . PAD (peripheral artery disease) ([place])   . PTSD (post-traumatic stress disorder)    Past Surgical History: History reviewed. No pertinent surgical history. HPI:  71 year old male patient with multiple comorbidities including: anxiety, alcohol abuse,GERD, COPD, PTSD, PAD, depression, lung cancer, chronic obstructive pulmonary disease. Pt found down by a friend. CXR no acute cardiopulmonary abnormality seen. Imaging of humerus revealed moderately displaced and comminuted proximal left humeral head and neck fracture is noted. Found to have acute inferior ST elevation myocardial infarction, severe lactic acidosis, A-fib, hypokalemia, severe malnutrition. RN reported pt pocketing food.    Assessment / Plan / Recommendation Clinical Impression  Pt cachectic, confused requiring verbal cues and redirection. He exhibits indications of oropharyngeal dysphagia marked by labial spill, oral holding, immediate and delayed cough following thin water and applesauce (coughing intermittently for 10 minutes after po's stopped) and wet vocal quality/congested cough suggestive of airway compromise. Suspect esophageal involvement given history of significant ETOH abuse. Mildly lethargic with cues needed to stay awake. Continue NPO and not appropriate for objective assessment. Will continue to follow. SLP Visit Diagnosis: Dysphagia, oropharyngeal phase (R13.12)    Aspiration Risk  Moderate aspiration risk     Diet Recommendation NPO   Medication Administration: Via alternative means    Other  Recommendations Oral Care Recommendations: Oral care QID   Follow up Recommendations  (TBD)      Frequency and Duration min 2x/week  2 weeks       Prognosis Prognosis for Safe Diet Advancement:  (fair-good) Barriers to Reach Goals: Behavior      Swallow Study   General HPI: 71 year old male patient with multiple comorbidities including: anxiety, alcohol abuse,GERD, COPD, PTSD, PAD, depression, lung cancer, chronic obstructive pulmonary disease. Pt found down by a friend. CXR no acute cardiopulmonary abnormality seen. Imaging of humerus revealed moderately displaced and comminuted proximal left humeral head and neck fracture is noted. Found to have acute inferior ST elevation myocardial infarction, severe lactic acidosis, A-fib, hypokalemia, severe malnutrition. RN reported pt pocketing food.  Type of Study: Bedside Swallow Evaluation Previous Swallow Assessment:  (none) Diet Prior to this Study: NPO Temperature Spikes Noted: No Respiratory Status: Room air History of Recent Intubation: No Behavior/Cognition: Lethargic/Drowsy;Cooperative;Requires cueing Oral Cavity Assessment: Within Functional Limits Oral Care Completed by SLP: No Oral Cavity - Dentition: Edentulous Vision: Functional for self-feeding Self-Feeding Abilities: Needs assist;Needs set up Patient Positioning: Upright in bed Baseline Vocal Quality: Normal Volitional Cough: Strong Volitional Swallow: Unable to elicit    Oral/Motor/Sensory Function Overall Oral Motor/Sensory Function: Generalized oral weakness   Ice Chips Ice chips: Not tested   Thin Liquid Thin Liquid: Impaired Presentation: Cup Oral Phase Impairments: Reduced labial seal Oral Phase Functional Implications: Right anterior spillage;Left anterior spillage;Prolonged oral transit Pharyngeal  Phase Impairments: Decreased hyoid-laryngeal movement;Suspected delayed  Swallow;Cough - Immediate;Cough - Delayed    Nectar Thick Nectar Thick Liquid: Not tested   Honey Thick Honey Thick Liquid: Not tested  Puree Puree: Impaired Presentation: Spoon Oral Phase Impairments: Reduced lingual movement/coordination Pharyngeal Phase Impairments: Cough - Delayed;Decreased hyoid-laryngeal movement   Solid   GO   Solid: Not tested        David Lucero 07/28/2017,10:49 AM  David Lucero.Ed [employer] [phone]

## 2017-07-28 NOTE — Progress Notes (Addendum)
Report called to 6E, patient going to room 14. Report given to [facility]. No belongings to account.

## 2017-07-28 NOTE — Progress Notes (Signed)
Neighbor called to check on patient today, states she is the person who looks after him and she is the one who found him down and called 911. She states that [place] lives alone, walks minimally, and often is confused. She states he has his days and nights mixed up as well. He has no children but he does have a brother named [name], number is in the chart. He was in the army and is a [place] veteran. The neighbor also states the patient uses tylenol and BC powder quite excessively. He went through two boxes of BC in one week. Information was given to Dr. Thereasa Solo.

## 2017-07-28 NOTE — Progress Notes (Addendum)
[place] for heparin Indication: chest pain/ACS and atrial fibrillation  No Known Allergies  Patient Measurements: Height: 5\' 7"  (170.2 cm) Weight: 136 lb 11 oz (62 kg) IBW/kg (Calculated) : 66.1 Heparin Dosing Weight: 66.2kg  Vital Signs: Temp: 97 F (36.1 C) (10/16 0630) Temp Source: Axillary (10/16 0630) BP: 116/54 (10/16 0700) Pulse Rate: 73 (10/16 0630)  Labs:  Recent Labs  07/25/17 1215  07/26/17 0629  07/27/17 0407 07/27/17 1530 07/28/17 0216 07/28/17 0234  HGB 8.4*  < > 7.5*  --  7.6*  --   --  6.7*  HCT 28.6*  < > 25.3*  --  25.0*  --   --  22.6*  PLT 324  --  209  --  182  --   --  239  APTT 33  --   --   --   --   --   --   --   LABPROT 16.0*  --   --   --   --   --   --   --   INR 1.29  --   --   --   --   --   --   --   HEPARINUNFRC  --   < > 0.30  < > 0.24* 0.23* 0.23*  --   CREATININE 1.67*  < > 0.82  --  0.57*  --   --  0.53*  CKTOTAL 3,172*  < > 5,971*  --   --  2,172*  --  1,107*  CKMB  --   --   --   --   --  12.4*  --   --   TROPONINI  --   --   --   --   --  6.47*  --   --   < > = values in this interval not displayed.  Estimated Creatinine Clearance: 73.2 mL/min (A) (by C-G formula based on SCr of 0.53 mg/dL (L)).   Assessment: 71 y/o M with Afib/MI being anticoagulated with heparin. He was not on anticoagulation PTA. His Hgb was down to 6.7 this morning and received a unit of blood. Nursing states he is not actively bleeding.  The heparin level was subtherapeutic again with morning labs, so the heparin drip was increased ~2u/kg/hr. Recheck heparin level in 8 hours.  Addendum 10/16 at 1545 -Recheck heparin level returned at 0.22 from 0.23 after the increased infusion. I spoke with nursing and there are no line complications and the infusion has only been briefly paused for lab draws. He also has had no bleeding.  Goal of Therapy:  Heparin level 0.3-0.7 units/ml Monitor platelets by anticoagulation  protocol: Yes   Plan:  Increase heparin to 1700 units/hr Heparin level at 0000 Monitor daily HL, CBC, s/sx of bleeding   Patterson Hammersmith PharmD PGY1 Pharmacy Practice Resident 07/28/2017 7:48 AM Pager: [phone] Phone: [phone]

## 2017-07-28 NOTE — Care Management Note (Addendum)
Case Management Note  Patient Details  Name: David Lucero MRN: [id number] Date of Birth: [birth year]  Subjective/Objective:    STEMI, rhabdomyolysis, AKI               Action/Plan: Discharge Planning: NCM spoke to pt at bedside. Pt agreeable to SNF rehab. Gave permission to speak to brother, [name] # [phone]. Attempted call to brother and no answer. Per Unit RN, Pt's neighbor and wife assist him as needed. He lives alone and per neighbor house is unkempt. They take him a meal each day and assist him with getting groceries/RX. Neighbor, [name]-friend/neighbor of 20 yrs [phone]. They called EMS when they found pt down. Pt was in the [employer]. Pt does not have any children. CSW referral for SNF. Waiting final recommendations. Will continue to follow for DC needs.  PCP [name] MD  Expected Discharge Date:  07/29/17               Expected Discharge Plan:  [place]  In-House Referral:  Clinical Social Work  Discharge planning Services  CM Consult  Post Acute Care Choice:  NA Choice offered to:  NA  DME Arranged:  N/A DME Agency:  NA  HH Arranged:  NA HH Agency:  NA  Status of Service:  In process, will continue to follow  If discussed at Long Length of Stay Meetings, dates discussed:    Additional Comments:  Erenest Rasher, RN 07/28/2017, 5:25 PM

## 2017-07-29 DIAGNOSIS — R001 Bradycardia, unspecified: Secondary | ICD-10-CM

## 2017-07-29 DIAGNOSIS — I480 Paroxysmal atrial fibrillation: Secondary | ICD-10-CM

## 2017-07-29 DIAGNOSIS — F10921 Alcohol use, unspecified with intoxication delirium: Secondary | ICD-10-CM

## 2017-07-29 DIAGNOSIS — M6282 Rhabdomyolysis: Secondary | ICD-10-CM

## 2017-07-29 DIAGNOSIS — F1010 Alcohol abuse, uncomplicated: Secondary | ICD-10-CM

## 2017-07-29 DIAGNOSIS — S42202A Unspecified fracture of upper end of left humerus, initial encounter for closed fracture: Secondary | ICD-10-CM

## 2017-07-29 DIAGNOSIS — R6510 Systemic inflammatory response syndrome (SIRS) of non-infectious origin without acute organ dysfunction: Secondary | ICD-10-CM

## 2017-07-29 DIAGNOSIS — E43 Unspecified severe protein-calorie malnutrition: Secondary | ICD-10-CM

## 2017-07-29 DIAGNOSIS — R338 Other retention of urine: Secondary | ICD-10-CM

## 2017-07-29 LAB — BPAM RBC
BLOOD PRODUCT EXPIRATION DATE: 201811092359
ISSUE DATE / TIME: 201810160500
UNIT TYPE AND RH: 5100

## 2017-07-29 LAB — CBC
HEMATOCRIT: 29 % — AB (ref 39.0–52.0)
Hemoglobin: 9.3 g/dL — ABNORMAL LOW (ref 13.0–17.0)
MCH: 24 pg — ABNORMAL LOW (ref 26.0–34.0)
MCHC: 32.1 g/dL (ref 30.0–36.0)
MCV: 74.7 fL — AB (ref 78.0–100.0)
Platelets: 239 10*3/uL (ref 150–400)
RBC: 3.88 MIL/uL — ABNORMAL LOW (ref 4.22–5.81)
RDW: 18.7 % — ABNORMAL HIGH (ref 11.5–15.5)
WBC: 8.4 10*3/uL (ref 4.0–10.5)

## 2017-07-29 LAB — BASIC METABOLIC PANEL
ANION GAP: 9 (ref 5–15)
BUN: 5 mg/dL — ABNORMAL LOW (ref 6–20)
CHLORIDE: 104 mmol/L (ref 101–111)
CO2: 24 mmol/L (ref 22–32)
Calcium: 7.8 mg/dL — ABNORMAL LOW (ref 8.9–10.3)
Creatinine, Ser: 0.51 mg/dL — ABNORMAL LOW (ref 0.61–1.24)
GFR calc non Af Amer: >60 mL/min (ref >60)
Glucose, Bld: 85 mg/dL (ref 65–99)
POTASSIUM: 3.4 mmol/L — AB (ref 3.5–5.1)
Sodium: 137 mmol/L (ref 135–145)

## 2017-07-29 LAB — MAGNESIUM: Magnesium: 1.6 mg/dL — ABNORMAL LOW (ref 1.7–2.4)

## 2017-07-29 LAB — TYPE AND SCREEN
ABO/RH(D): O POS
Antibody Screen: NEGATIVE
UNIT DIVISION: 0

## 2017-07-29 LAB — HEPARIN LEVEL (UNFRACTIONATED)
HEPARIN UNFRACTIONATED: 0.42 [IU]/mL (ref 0.30–0.70)
Heparin Unfractionated: 0.41 IU/mL (ref 0.30–0.70)

## 2017-07-29 MED ORDER — FOLIC ACID 5 MG/ML IJ SOLN
1.0000 mg | Freq: Every day | INTRAMUSCULAR | Status: DC
  Administered 2017-07-29 – 2017-08-03 (×6): 1 mg via INTRAVENOUS
  Filled 2017-07-29 (×8): qty 0.2

## 2017-07-29 MED ORDER — MORPHINE SULFATE (PF) 2 MG/ML IV SOLN
2.0000 mg | INTRAVENOUS | Status: DC | PRN
  Administered 2017-07-29 – 2017-08-08 (×12): 2 mg via INTRAVENOUS
  Filled 2017-07-29 (×12): qty 1

## 2017-07-29 MED ORDER — POTASSIUM CHLORIDE 10 MEQ/100ML IV SOLN
10.0000 meq | INTRAVENOUS | Status: AC
  Administered 2017-07-29 (×6): 10 meq via INTRAVENOUS
  Filled 2017-07-29 (×6): qty 100

## 2017-07-29 MED ORDER — ENOXAPARIN SODIUM 30 MG/0.3ML ~~LOC~~ SOLN
30.0000 mg | SUBCUTANEOUS | Status: DC
  Administered 2017-07-30 – 2017-08-06 (×8): 30 mg via SUBCUTANEOUS
  Filled 2017-07-29 (×8): qty 0.3

## 2017-07-29 MED ORDER — ASPIRIN EC 81 MG PO TBEC
81.0000 mg | DELAYED_RELEASE_TABLET | Freq: Every day | ORAL | Status: DC
  Administered 2017-07-31 – 2017-08-03 (×4): 81 mg via ORAL
  Filled 2017-07-29 (×4): qty 1

## 2017-07-29 MED ORDER — MAGNESIUM SULFATE 2 GM/50ML IV SOLN
2.0000 g | Freq: Once | INTRAVENOUS | Status: AC
  Administered 2017-07-29: 2 g via INTRAVENOUS
  Filled 2017-07-29: qty 50

## 2017-07-29 MED ORDER — FOLIC ACID 1 MG PO TABS: 1.0000 mg | ORAL_TABLET | Freq: Every day | ORAL | Status: DC

## 2017-07-29 MED ORDER — POTASSIUM CHLORIDE 10 MEQ/100ML IV SOLN
INTRAVENOUS | Status: AC
  Filled 2017-07-29: qty 100

## 2017-07-29 NOTE — Progress Notes (Signed)
ANTICOAGULATION CONSULT NOTE - Follow Up Consult  Pharmacy Consult for Heparin Indication: chest pain/ACS  No Known Allergies  Patient Measurements: Height: 5\' 7"  (170.2 cm) Weight: 145 lb 11.2 oz (66.1 kg) IBW/kg (Calculated) : 66.1 Heparin Dosing Weight: 66.1 kg  Vital Signs: Temp: 98.2 F (36.8 C) (10/17 0900) Temp Source: Axillary (10/17 0900) BP: 154/69 (10/17 0900) Pulse Rate: 94 (10/17 0900)  Labs:  Recent Labs  07/27/17 0407 07/27/17 1530  07/28/17 0234 07/28/17 0823 07/28/17 1437 07/29/17 0012 07/29/17 0028 07/29/17 0713  HGB 7.6*  --   --  6.7* 8.6*  --   --  9.3*  --   HCT 25.0*  --   --  22.6* 28.1*  --   --  29.0*  --   PLT 182  --   --  239  --   --   --  239  --   HEPARINUNFRC 0.24* 0.23*  < >  --   --  0.22* 0.42  --  0.41  CREATININE 0.57*  --   --  0.53*  --   --   --   --  0.51*  CKTOTAL  --  2,172*  --  1,107*  --   --   --   --   --   CKMB  --  12.4*  --   --   --   --   --   --   --   TROPONINI  --  6.47*  --   --   --   --   --   --   --   < > = values in this interval not displayed.  Estimated Creatinine Clearance: 78 mL/min (A) (by C-G formula based on SCr of 0.51 mg/dL (L)).   Assessment:  Anticoag: Heparin for r/o ACS, afib - Hgb 6.7>9.3, plts 239 stable, no AC PTA -received 1u pRBCs 10/16, nurse states no bleeding seen -Cards note states that they would not AC long-term even w/Afib d/t alcohol abuse  Goal of Therapy:  Heparin level 0.3-0.7 units/ml Monitor platelets by anticoagulation protocol: Yes   Plan:  Heparin 1700 units/hr Daily heparin level and CBC K runs x 6 Mag 2g IV x 1  Anairis Knick S. Alford Highland, PharmD, [facility] Clinical Staff Pharmacist Pager [phone]  Eilene Ghazi Stillinger 07/29/2017,10:09 AM

## 2017-07-29 NOTE — Plan of Care (Signed)
Problem: Safety: Goal: Ability to remain free from injury will improve Outcome: Progressing Pt confused and disoriented to place, time and situation. Bed in lowest position and bed alarm implemented

## 2017-07-29 NOTE — Progress Notes (Signed)
PROGRESS NOTE    David Lucero  WUJ:[id number] DOB: December 12, 1945 DOA: 07/25/2017 PCP: [name], MD   Brief Narrative:  71 year old WM PMHx PTSD,Anxiety,Depression EtOH abuse, Lung cancer, COPD,PAD  EMS was called the a.m. of 10/13 after the patient was found lying on the floor of his home by a friend. The house was cold. He had last been seen normal on 10/12 around noon. On EMS arrival he was noted to have ST elevation with left upper extremity trauma. He was found to be hypothermic and tremulous, he was transferred to [facility] emergency room ER evaluation: In ER complained of left arm pain, denied chest discomfort or shortness of breath. 12-lead showed atrial fibrillation, ST depression in V1 through V3, also concerns of nonspecific intraventricular conduction delay His initial temperature was 87.9 Fahrenheit, he was bradycardic, and had rapid respiratory rate. His initial lactic acid was 16, hemoglobin 8. A chest x-ray demonstrated fracture of the proximal left humerus. His troponin was mildly elevated. He was treated with warmed IV fluids, arm was immobilized, seen by Dr. Ellyn Hack who felt EKG was consistent with inferior MI. He recommended IV heparin, and supportive care for medical issues prior to further cardiac intervention. Given his degree of metabolic disarray critical care was asked to admit.    Subjective: 10/17 A/O 1 (does not know where, when, why), follows some commands, negative CP, negative SOB, negative abdominal pain   Assessment & Plan:   Active Problems:   STEMI (ST elevation myocardial infarction) (HCC)   SIRS - no current clear source of infection -positive productive cough (thick yellow sputum) Obtained PCXR -    Acute Inferior STEMI -Although cardiology believes has a high probability of obstructive CAD, NOT a candidate for cardiac catheterization  -Echocardiogram; Limited without significant LV impairment. See results below  Bradycardia -Resolved      Paroxysmal Atrial fibrillation  -currently NSR     Rhabdomyolysis -lactated Ringer's 156ml/hr   Recent Labs Lab 07/25/17 1215 07/25/17 1800 07/26/17 0053 07/26/17 0629 07/27/17 1530 07/28/17 0234  CKTOTAL 3,172* 4,532* 5,332* 5,971* 2,172* 1,107*  -continue to monitor closely   Acute renal failure    Recent Labs Lab 07/25/17 1237 07/25/17 1640 07/26/17 0629 07/27/17 0407 07/28/17 0234 07/29/17 0713  CREATININE 1.30* 1.11 0.82 0.57* 0.53* 0.51*  -resolved    Acute urinary retention Unable to void post foley removal - replace foley - trial of flomax +/- urecholine when more alert before attempt to remove foley again    Hypokalemia -Potassium goal> 4 -PotassiumIV 60 mEq  Hypomagnesemia -Magnesium goal> 2 -Magnesium IV 2 g  Severe protein calorie malnutrition in setting of alcohol abuse Advance diet as mental status allows - not presently safe to begin diet due to AMS   Microcytic anemia Hgb has drifted down - unclear if this is simply due to dilution, or indicative of blood loss in setting of anticoag use - Fe studies suggest severe malnutrition associated anemia - transfused 1U PRBC today    History of laryngeal and lung cancer records not currently available - reportedly treated 10 years ago      Hyperglycemia A1c not c/w DM    Acute delirium - History of alcohol abuse / EtOH withdrawal CIWA protocol - hx from neighbors/friends suggest a baseline of cognitive decline, perhaps due to EtOH, or possibly dementia of other cause - follow mental state    Left proximal humerus fracture Keep immobilized - Ortho reports wound is amenable to non-operative tx - non-weightbearing w/ continued  use of sling       DVT prophylaxis: iV heparin Code Status: full Family Communication: one Disposition Plan: TBD   Consultants:  [facility]    Procedures/Significant Events:  10/14 Echocardiogram:Transthoracic echocardiography. Image    quality was poor. The study was technically difficult, as a   result of poor acoustic windows, poor patient compliance, and   restricted patient mobility, and a broken left arm in a sling - Left ventricle: Grossly normal LV function based on limited   subcostal views. Systolic function was normal. The estimated   ejection fraction was in the range of 60% to 65%. - Aorta: Poorly visualied - calcified leafelts, possible stenosis. - Right ventricle: The cavity size was mildly dilated. Mild   hypokinesis.    I have personally reviewed and interpreted all radiology studies and my findings are as above.  VENTILATOR SETTINGS:    Cultures   Antimicrobials: Anti-infectives    Start     Dose/Rate Route Frequency Ordered Stop   07/26/17 1000  vancomycin (VANCOCIN) IVPB 750 mg/150 ml premix  Status:  Discontinued     750 mg 150 mL/hr over 60 Minutes Intravenous Every 12 hours 07/26/17 0955 07/27/17 1147   07/25/17 2300  piperacillin-tazobactam (ZOSYN) IVPB 3.375 g  Status:  Discontinued     3.375 g 12.5 mL/hr over 240 Minutes Intravenous Every 8 hours 07/25/17 1619 07/27/17 1147   07/25/17 1630  vancomycin (VANCOCIN) IVPB 1000 mg/200 mL premix  Status:  Discontinued     1,000 mg 200 mL/hr over 60 Minutes Intravenous Every 24 hours 07/25/17 1619 07/26/17 0955   07/25/17 1630  piperacillin-tazobactam (ZOSYN) IVPB 3.375 g     3.375 g 100 mL/hr over 30 Minutes Intravenous  Once 07/25/17 1619 07/25/17 1708       Devices   LINES / TUBES:      Continuous Infusions: . heparin 1,700 Units/hr (07/29/17 0535)  . lactated ringers 75 mL/hr at 07/29/17 0535     Objective: Vitals:   07/28/17 2100 07/28/17 2318 07/29/17 0223 07/29/17 0410  BP: 115/89 100/77  (!) 135/54  Pulse: (!) 47 (!) 45 94 (!) 107  Resp: 13 16  16   Temp: 97.6 F (36.4 C)   98.1 F (36.7 C)  TempSrc: Oral   Oral  SpO2: 96% 97%  94%  Weight:    145 lb 11.2 oz (66.1 kg)  Height:        Intake/Output Summary  (Last 24 hours) at 07/29/17 0849 Last data filed at 07/29/17 0535  Gross per 24 hour  Intake          2113.67 ml  Output              725 ml  Net          1388.67 ml   Filed Weights   07/27/17 0500 07/28/17 0500 07/29/17 0410  Weight: 135 lb 9.3 oz (61.5 kg) 136 lb 11 oz (62 kg) 145 lb 11.2 oz (66.1 kg)    Examination:  General: A/O 1 (does not know where, when, why)No acute respiratory distress Neck:  Negative scars, masses, torticollis, lymphadenopathy, JVD Lungs: decreased bibasilar breath sounds Rt>>>Lt, negative wheezing Cardiovascular: Regular rate and rhythm without murmur gallop or rub normal S1 and S2 Abdomen: negative abdominal pain, nondistended, positive soft, bowel sounds, no rebound, no ascites, no appreciable mass Extremities: No significant cyanosis, clubbing, or edema bilateral lower extremities Skin: multiple lacerations bilateral lower extremities, extensive bruising and swelling LUE/shoulder onsistent  with Psychiatric:  Unable to assess secondary to altered mental status Central nervous system:  Cranial nerves II through XII intact, tongue/uvula midline. Moves all extremities spontaneously. Follow somec ommands.Unable to assess secondary to altered mental status,  .     Data Reviewed: Care during the described time interval was provided by me .  I have reviewed this patient's available data, including medical history, events of note, physical examination, and all test results as part of my evaluation.   CBC:  Recent Labs Lab 07/25/17 1215  07/26/17 0629 07/27/17 0407 07/28/17 0234 07/28/17 0823 07/29/17 0028  WBC 18.8*  --  9.6 8.4 7.1  --  8.4  NEUTROABS 15.8*  --   --   --   --   --   --   HGB 8.4*  < > 7.5* 7.6* 6.7* 8.6* 9.3*  HCT 28.6*  < > 25.3* 25.0* 22.6* 28.1* 29.0*  MCV 76.1*  --  74.4* 74.0* 73.9*  --  74.7*  PLT 324  --  209 182 239  --  239  < > = values in this interval not displayed. Basic Metabolic Panel:  Recent Labs Lab  07/25/17 1640 07/26/17 0629 07/26/17 1031 07/27/17 0407 07/28/17 0234 07/28/17 1539 07/29/17 0713  NA 140 139  --  138 138  --  137  K 2.8* 3.4*  --  3.5 2.7* 4.0 3.4*  CL 107 105  --  104 105  --  104  CO2 19* 21*  --  24 24  --  24  GLUCOSE 123* 89  --  88 97  --  85  BUN 11 12  --  9 5*  --  5*  CREATININE 1.11 0.82  --  0.57* 0.53*  --  0.51*  CALCIUM 8.3* 8.0*  --  7.8* 7.4*  --  7.8*  MG  --   --  1.8  --   --   --  1.6*   GFR: Estimated Creatinine Clearance: 78 mL/min (A) (by C-G formula based on SCr of 0.51 mg/dL (L)). Liver Function Tests:  Recent Labs Lab 07/25/17 1215 07/25/17 1640 07/28/17 0234  AST 95* 110* 81*  ALT 23 22 30   ALKPHOS 123 104 112  BILITOT 0.9 0.8 0.7  PROT 5.8* 5.2* 4.6*  ALBUMIN 2.7* 2.6* 2.0*   No results for input(s): LIPASE, AMYLASE in the last 168 hours. No results for input(s): AMMONIA in the last 168 hours. Coagulation Profile:  Recent Labs Lab 07/25/17 1215  INR 1.29   Cardiac Enzymes:  Recent Labs Lab 07/25/17 1800 07/26/17 0053 07/26/17 0629 07/27/17 1530 07/28/17 0234  CKTOTAL 4,532* 5,332* 5,971* 2,172* 1,107*  CKMB  --   --   --  12.4*  --   TROPONINI  --   --   --  6.47*  --    BNP (last 3 results) No results for input(s): PROBNP in the last 8760 hours. HbA1C:  Recent Labs  07/28/17 0235  HGBA1C 5.5   CBG: No results for input(s): GLUCAP in the last 168 hours. Lipid Profile:  Recent Labs  07/28/17 0234  CHOL 105  HDL 21*  LDLCALC 63  TRIG 106  CHOLHDL 5.0   Thyroid Function Tests: No results for input(s): TSH, T4TOTAL, FREET4, T3FREE, THYROIDAB in the last 72 hours. Anemia Panel:  Recent Labs  07/28/17 0234 07/28/17 0237  VITAMINB12 479  --   FOLATE  --  12.3  FERRITIN 53  --   TIBC 225*  --  IRON 8*  --   RETICCTPCT 1.9  --    Urine analysis:    Component Value Date/Time   COLORURINE YELLOW 07/25/2017 1440   APPEARANCEUR CLEAR 07/25/2017 1440   LABSPEC 1.010 07/25/2017 1440    PHURINE 6.0 07/25/2017 1440   GLUCOSEU NEGATIVE 07/25/2017 1440   HGBUR LARGE (A) 07/25/2017 1440   BILIRUBINUR NEGATIVE 07/25/2017 1440   KETONESUR 20 (A) 07/25/2017 1440   PROTEINUR 30 (A) 07/25/2017 1440   UROBILINOGEN 0.2 04/30/2014 1350   NITRITE NEGATIVE 07/25/2017 1440   LEUKOCYTESUR NEGATIVE 07/25/2017 1440   Sepsis Labs: @LABRCNTIP (procalcitonin:4,lacticidven:4)  ) Recent Results (from the past 240 hour(s))  Culture, blood (routine x 2)     Status: None (Preliminary result)   Collection Time: 07/25/17 12:15 PM  Result Value Ref Range Status   Specimen Description BLOOD RIGHT ANTECUBITAL  Final   Special Requests   Final    BOTTLES DRAWN AEROBIC AND ANAEROBIC Blood Culture adequate volume   Culture NO GROWTH 3 DAYS  Final   Report Status PENDING  Incomplete  Urine culture     Status: None   Collection Time: 07/25/17  2:40 PM  Result Value Ref Range Status   Specimen Description URINE, CATHETERIZED  Final   Special Requests NONE  Final   Culture NO GROWTH  Final   Report Status 07/27/2017 FINAL  Final  MRSA PCR Screening     Status: None   Collection Time: 07/25/17  6:11 PM  Result Value Ref Range Status   MRSA by PCR NEGATIVE NEGATIVE Final    Comment:        The GeneXpert MRSA Assay (FDA approved for NASAL specimens only), is one component of a comprehensive MRSA colonization surveillance program. It is not intended to diagnose MRSA infection nor to guide or monitor treatment for MRSA infections.   Culture, blood (routine x 2)     Status: None (Preliminary result)   Collection Time: 07/25/17  6:34 PM  Result Value Ref Range Status   Specimen Description BLOOD RIGHT HAND  Final   Special Requests IN PEDIATRIC BOTTLE Blood Culture adequate volume  Final   Culture NO GROWTH 2 DAYS  Final   Report Status PENDING  Incomplete         Radiology Studies: Ct Shoulder Left Wo Contrast  Result Date: 07/27/2017 CLINICAL DATA:  Humerus fracture. EXAM: CT  OF THE UPPER LEFT EXTREMITY WITHOUT CONTRAST TECHNIQUE: Multidetector CT imaging of the upper left extremity was performed according to the standard protocol. COMPARISON:  Left shoulder x-rays dated July 25, 2017. FINDINGS: Bones/Joint/Cartilage Again seen is a comminuted fracture of the left humerus surgical neck. The fracture extends into the base of the greater tuberosity. There is approximately 1 cm of anterior displacement of the distal fragment, and 2-3 cm of overriding fragments. Old healed left clavicle and rib fractures. Mild degenerative changes of the acromioclavicular joint. Ligaments Suboptimally assessed by CT. Muscles and Tendons No focal abnormality. Soft tissues Small amount of hematoma surrounding the fracture, extending into the left axilla. Mild soft tissue edema about the left shoulder. Mild left upper lobe centrilobular and paraseptal emphysema. IMPRESSION: 1. Mildly displaced, comminuted, impacted fracture of the left humerus surgical neck extending into the base of the greater tuberosity. Electronically Signed   By: Titus Dubin M.D.   On: 07/27/2017 14:37        Scheduled Meds: . aspirin EC  325 mg Oral Daily  . atorvastatin  40 mg Oral q1800  .  chlorhexidine  15 mL Mouth Rinse BID  . clopidogrel  75 mg Oral Daily  . folic acid  1 mg Intravenous Daily  . Influenza vac split quadrivalent PF  0.5 mL Intramuscular Tomorrow-1000  . LORazepam  0.5 mg Oral BID  . mouth rinse  15 mL Mouth Rinse BID  . metoprolol tartrate  2.5 mg Intravenous Q6H  . pneumococcal 23 valent vaccine  0.5 mL Intramuscular Tomorrow-1000  . thiamine  100 mg Intravenous Daily  . traZODone  150 mg Oral QHS   Continuous Infusions: . heparin 1,700 Units/hr (07/29/17 0535)  . lactated ringers 75 mL/hr at 07/29/17 0535     LOS: 4 days    Time spent: 40 minutes    WOODS, Geraldo Docker, MD [facility] Pager [phone]   If 7PM-7AM, please contact  night-coverage [email or web address] Password [email or web address] 07/29/2017, 8:49 AM

## 2017-07-29 NOTE — Progress Notes (Signed)
ANTICOAGULATION CONSULT NOTE   Pharmacy Consult for heparin Indication: chest pain/ACS and atrial fibrillation  No Known Allergies  Patient Measurements: Height: 5\' 7"  (170.2 cm) Weight: 136 lb 11 oz (62 kg) IBW/kg (Calculated) : 66.1 Heparin Dosing Weight: 66.2kg  Vital Signs: Temp: 97.6 F (36.4 C) (10/16 2100) Temp Source: Oral (10/16 2100) BP: 100/77 (10/16 2318) Pulse Rate: 45 (10/16 2318)  Labs:  Recent Labs  07/26/17 0629  07/27/17 0407 07/27/17 1530 07/28/17 0216 07/28/17 0234 07/28/17 0823 07/28/17 1437 07/29/17 0012 07/29/17 0028  HGB 7.5*  --  7.6*  --   --  6.7* 8.6*  --   --  9.3*  HCT 25.3*  --  25.0*  --   --  22.6* 28.1*  --   --  29.0*  PLT 209  --  182  --   --  239  --   --   --  239  HEPARINUNFRC 0.30  < > 0.24* 0.23* 0.23*  --   --  0.22* 0.42  --   CREATININE 0.82  --  0.57*  --   --  0.53*  --   --   --   --   CKTOTAL 5,971*  --   --  2,172*  --  1,107*  --   --   --   --   CKMB  --   --   --  12.4*  --   --   --   --   --   --   TROPONINI  --   --   --  6.47*  --   --   --   --   --   --   < > = values in this interval not displayed.  Estimated Creatinine Clearance: 73.2 mL/min (A) (by C-G formula based on SCr of 0.53 mg/dL (L)).   Assessment: 72 y.o. male with AFIB/MI for heparin Goal of Therapy:  Heparin level 0.3-0.7 units/ml Monitor platelets by anticoagulation protocol: Yes   Plan:  Continue Heparin at current rate   Phillis Knack, PharmD, BCPS

## 2017-07-29 NOTE — Progress Notes (Addendum)
Progress Note  Patient Name: David Lucero Date of Encounter: 07/29/2017  Primary Cardiologist: new  Subjective   No history obtainable  Inpatient Medications    Scheduled Meds: . aspirin EC  325 mg Oral Daily  . atorvastatin  40 mg Oral q1800  . chlorhexidine  15 mL Mouth Rinse BID  . clopidogrel  75 mg Oral Daily  . folic acid  1 mg Intravenous Daily  . Influenza vac split quadrivalent PF  0.5 mL Intramuscular Tomorrow-1000  . LORazepam  0.5 mg Oral BID  . mouth rinse  15 mL Mouth Rinse BID  . metoprolol tartrate  2.5 mg Intravenous Q6H  . pneumococcal 23 valent vaccine  0.5 mL Intramuscular Tomorrow-1000  . thiamine  100 mg Intravenous Daily  . traZODone  150 mg Oral QHS   Continuous Infusions: . heparin 1,700 Units/hr (07/29/17 1047)  . lactated ringers 125 mL/hr at 07/29/17 1554   PRN Meds: LORazepam, morphine injection   Vital Signs    Vitals:   07/29/17 1159 07/29/17 1200 07/29/17 1300 07/29/17 1400  BP: (!) 173/65 (!) 173/65 (!) 166/96 (!) 174/98  Pulse: 83  (!) 49   Resp: 18 16 18 18   Temp: (!) 97.5 F (36.4 C)     TempSrc: Axillary     SpO2: 93%  92%   Weight:      Height:        Intake/Output Summary (Last 24 hours) at 07/29/17 1919 Last data filed at 07/29/17 1808  Gross per 24 hour  Intake           973.67 ml  Output             1250 ml  Net          -276.33 ml   Filed Weights   07/27/17 0500 07/28/17 0500 07/29/17 0410  Weight: 135 lb 9.3 oz (61.5 kg) 136 lb 11 oz (62 kg) 145 lb 11.2 oz (66.1 kg)    Telemetry    Sinus rhythm/sinus tachycardia with PACs - Personally Reviewed  Physical Exam  Elderly-appearing male, extremely confused GEN: No acute distress.   Neck: No JVD Cardiac: RRR, no murmurs, rubs, or gallops.  Respiratory:  coarse breath sounds to auscultation bilaterally. GI: Soft, nontender, non-distended  MS: No edema; No deformity. Neuro:  Nonfocal  Psych: Normal affect   Labs    Chemistry Recent Labs Lab  07/25/17 1215  07/25/17 1640  07/27/17 0407 07/28/17 0234 07/28/17 1539 07/29/17 0713  NA 135  < > 140  < > 138 138  --  137  K 3.6  < > 2.8*  < > 3.5 2.7* 4.0 3.4*  CL 99*  < > 107  < > 104 105  --  104  CO2 13*  --  19*  < > 24 24  --  24  GLUCOSE 123*  < > 123*  < > 88 97  --  85  BUN 11  < > 11  < > 9 5*  --  5*  CREATININE 1.67*  < > 1.11  < > 0.57* 0.53*  --  0.51*  CALCIUM 8.7*  --  8.3*  < > 7.8* 7.4*  --  7.8*  PROT 5.8*  --  5.2*  --   --  4.6*  --   --   ALBUMIN 2.7*  --  2.6*  --   --  2.0*  --   --   AST 95*  --  110*  --   --  81*  --   --   ALT 23  --  22  --   --  30  --   --   ALKPHOS 123  --  104  --   --  112  --   --   BILITOT 0.9  --  0.8  --   --  0.7  --   --   GFRNONAA 39*  --  >60  < > >60 >60  --  >60  GFRAA 46*  --  >60  < > >60 >60  --  >60  ANIONGAP 23*  --  14  < > 10 9  --  9  < > = values in this interval not displayed.   Hematology Recent Labs Lab 07/27/17 0407 07/28/17 0234 07/28/17 0823 07/29/17 0028  WBC 8.4 7.1  --  8.4  RBC 3.38* 3.06*  3.06*  --  3.88*  HGB 7.6* 6.7* 8.6* 9.3*  HCT 25.0* 22.6* 28.1* 29.0*  MCV 74.0* 73.9*  --  74.7*  MCH 22.5* 21.9*  --  24.0*  MCHC 30.4 29.6*  --  32.1  RDW 19.8* 19.3*  --  18.7*  PLT 182 239  --  239    Cardiac Enzymes Recent Labs Lab 07/27/17 1530  TROPONINI 6.47*    Recent Labs Lab 07/25/17 1231  TROPIPOC 0.08     BNPNo results for input(s): BNP, PROBNP in the last 168 hours.   DDimer No results for input(s): DDIMER in the last 168 hours.   Radiology    No results found.   Patient Profile     71 y.o. male with history of lung cancer, COPD found down at home and found to have rhabdomyolysis and lactic acidosis. He was hypothermic on presentation and there were EKG changes c/w injury in the setting of profound metabolic derangement. Troponin is 6.47.  Assessment & Plan    Inferoposterior STEMI, transient ST elevation. Cardiac enzymes are reviewed and are more consistent  with rhabdomyolysis as there is a marked elevation in total CK and only mild elevation in MB fraction. It's possible that the patient had coronary vasospasm in the context of severe metabolic derangements. The patient is extremely confused and may have advanced dementia from alcoholism. He is clearly not a candidate for cardiac catheterization at this time. Would recommend treating him medically as previously outlined. Medications are reviewed. Will reduce aspirin 81 mg. Continue atorvastatin clopidogrel and metoprolol. The patient does not require heparin from a cardiac perspective any further. Will change him to DVT prophylaxis Lovenox. No other recommendations at this time. Please call if we can be of any further assistance in his care.  For questions or updates, please contact [place] Please consult [email or web address] for contact info under Cardiology/STEMI.      Deatra James, MD  07/29/2017, 7:19 PM

## 2017-07-29 NOTE — Progress Notes (Signed)
  Speech Language Pathology Treatment: Dysphagia  Patient Details Name: David Lucero MRN: [id number] DOB: [birth year] Today's Date: 07/29/2017 Time: 7262-0355 SLP Time Calculation (min) (ACUTE ONLY): 14 min  Assessment / Plan / Recommendation Clinical Impression  Pt makes minimal attempts to orally accept and manipulate boluses. When he does get ice chips in his mouth, he orally holds them with a mouth-open posture despite Max cueing. Pt eventually starts coughing yet no pharyngeal swallow was obviously noted. Suspect that the thin liquids are passively spilling into his pharynx/larynx without attempts to protect his airway. Pt's mentation remains a barrier to safe swallowing. Discussed with RN - pt is not yet ready for instrumental testing, nor is he ready for PO meds. Will continue to follow for readiness pending improved cognitive status.   HPI HPI: 71 year old male patient with multiple comorbidities including: anxiety, alcohol abuse,GERD, COPD, PTSD, PAD, depression, lung cancer, chronic obstructive pulmonary disease. Pt found down by a friend. CXR no acute cardiopulmonary abnormality seen. Imaging of humerus revealed moderately displaced and comminuted proximal left humeral head and neck fracture is noted. Found to have acute inferior ST elevation myocardial infarction, severe lactic acidosis, A-fib, hypokalemia, severe malnutrition. RN reported pt pocketing food.       SLP Plan  Continue with current plan of care       Recommendations  Diet recommendations: NPO Medication Administration: Via alternative means                Oral Care Recommendations: Oral care QID Follow up Recommendations: Skilled Nursing facility SLP Visit Diagnosis: Dysphagia, oropharyngeal phase (R13.12) Plan: Continue with current plan of care       GO                Germain Osgood 07/29/2017, 9:43 AM  Germain Osgood, M.A. CCC-SLP [phone]

## 2017-07-29 NOTE — Care Management Important Message (Signed)
Important Message  Patient Details  Name: David Lucero MRN: [id number] Date of Birth: Jul 18, 1946   Medicare Important Message Given:  Yes    Nathen May 07/29/2017, 10:55 AM

## 2017-07-30 ENCOUNTER — Inpatient Hospital Stay (HOSPITAL_COMMUNITY): Payer: Medicare Other

## 2017-07-30 LAB — CBC
HEMATOCRIT: 27.2 % — AB (ref 39.0–52.0)
HEMOGLOBIN: 8.2 g/dL — AB (ref 13.0–17.0)
MCH: 22.7 pg — AB (ref 26.0–34.0)
MCHC: 30.1 g/dL (ref 30.0–36.0)
MCV: 75.1 fL — ABNORMAL LOW (ref 78.0–100.0)
Platelets: 231 10*3/uL (ref 150–400)
RBC: 3.62 MIL/uL — AB (ref 4.22–5.81)
RDW: 18.9 % — ABNORMAL HIGH (ref 11.5–15.5)
WBC: 7.7 10*3/uL (ref 4.0–10.5)

## 2017-07-30 LAB — BASIC METABOLIC PANEL
ANION GAP: 12 (ref 5–15)
BUN: <5 mg/dL — ABNORMAL LOW (ref 6–20)
CHLORIDE: 103 mmol/L (ref 101–111)
CO2: 22 mmol/L (ref 22–32)
Calcium: 7.5 mg/dL — ABNORMAL LOW (ref 8.9–10.3)
Creatinine, Ser: 0.49 mg/dL — ABNORMAL LOW (ref 0.61–1.24)
GFR calc Af Amer: >60 mL/min (ref >60)
GLUCOSE: 77 mg/dL (ref 65–99)
POTASSIUM: 3.2 mmol/L — AB (ref 3.5–5.1)
Sodium: 137 mmol/L (ref 135–145)

## 2017-07-30 LAB — CULTURE, BLOOD (ROUTINE X 2)
CULTURE: NO GROWTH
Special Requests: ADEQUATE

## 2017-07-30 LAB — GLUCOSE, CAPILLARY: GLUCOSE-CAPILLARY: 79 mg/dL (ref 65–99)

## 2017-07-30 LAB — CK: CK TOTAL: 484 U/L — AB (ref 49–397)

## 2017-07-30 LAB — MAGNESIUM: Magnesium: 1.6 mg/dL — ABNORMAL LOW (ref 1.7–2.4)

## 2017-07-30 MED ORDER — POTASSIUM CHLORIDE 10 MEQ/100ML IV SOLN
10.0000 meq | INTRAVENOUS | Status: AC
  Administered 2017-07-30 (×3): 10 meq via INTRAVENOUS
  Filled 2017-07-30 (×2): qty 100

## 2017-07-30 MED ORDER — HALOPERIDOL LACTATE 5 MG/ML IJ SOLN
0.5000 mg | Freq: Four times a day (QID) | INTRAMUSCULAR | Status: DC | PRN
  Administered 2017-07-31 – 2017-08-02 (×3): 1 mg via INTRAVENOUS
  Filled 2017-07-30 (×3): qty 1

## 2017-07-30 MED ORDER — MAGNESIUM SULFATE 2 GM/50ML IV SOLN
2.0000 g | Freq: Once | INTRAVENOUS | Status: AC
  Administered 2017-07-30: 2 g via INTRAVENOUS
  Filled 2017-07-30: qty 50

## 2017-07-30 MED ORDER — POTASSIUM CHLORIDE 10 MEQ/100ML IV SOLN
10.0000 meq | INTRAVENOUS | Status: AC
  Administered 2017-07-30 (×3): 10 meq via INTRAVENOUS
  Filled 2017-07-30 (×3): qty 100

## 2017-07-30 MED ORDER — TAMSULOSIN HCL 0.4 MG PO CAPS
0.4000 mg | ORAL_CAPSULE | Freq: Every day | ORAL | Status: DC
  Administered 2017-07-31 – 2017-08-10 (×11): 0.4 mg via ORAL
  Filled 2017-07-30 (×11): qty 1

## 2017-07-30 MED ORDER — POTASSIUM CHLORIDE 10 MEQ/100ML IV SOLN
10.0000 meq | INTRAVENOUS | Status: AC
  Filled 2017-07-30: qty 100

## 2017-07-30 MED ORDER — JEVITY 1.2 CAL PO LIQD
1000.0000 mL | ORAL | Status: DC
  Administered 2017-07-31: 1000 mL
  Filled 2017-07-30 (×3): qty 1000

## 2017-07-30 NOTE — Progress Notes (Signed)
  Speech Language Pathology Treatment: Dysphagia  Patient Details Name: David Lucero MRN: [id number] DOB: [birth year] Today's Date: 07/30/2017 Time: 5498-2641 SLP Time Calculation (min) (ACUTE ONLY): 12 min  Assessment / Plan / Recommendation Clinical Impression  Pt is more alert today and following some commands, with improved awareness for oral acceptance. He continues to have little to no hyolaryngeal movement to palpation during trials. Prolonged coughing episode ensues, requiring oral suction of mild-moderate amount of secretions. His cough seems strong, but audible wetness seems to reoccur. Given the severity of his dysphagia as his mentation starts to improve, I wonder if he could have some baseline deficits - particularly given his h/o laryngeal cancer (unclear how this was treated). Recommend to remain NPO for now. If his mentation continues to progress, he may benefit from [facility] for more objective assessment even if signs of aspiration persist.   HPI HPI: 71 year old male patient with multiple comorbidities including: anxiety, alcohol abuse,GERD, COPD, PTSD, PAD, depression, lung cancer, laryngeal cancer, chronic obstructive pulmonary disease. Pt found down by a friend. CXR no acute cardiopulmonary abnormality seen. Imaging of humerus revealed moderately displaced and comminuted proximal left humeral head and neck fracture is noted. Found to have acute inferior ST elevation myocardial infarction, severe lactic acidosis, A-fib, hypokalemia, severe malnutrition. RN reported pt pocketing food.       SLP Plan  Continue with current plan of care       Recommendations  Diet recommendations: NPO Medication Administration: Via alternative means                Oral Care Recommendations: Oral care QID Follow up Recommendations: Skilled Nursing facility SLP Visit Diagnosis: Dysphagia, oropharyngeal phase (R13.12) Plan: Continue with current plan of care       GO                 Germain Osgood 07/30/2017, 3:16 PM  Germain Osgood, M.A. CCC-SLP [phone]

## 2017-07-30 NOTE — Progress Notes (Signed)
PROGRESS NOTE    David Lucero  YBO:[id number] DOB: 1945/11/17 DOA: 07/25/2017 PCP: [name], MD   Brief Narrative:  71 year old WM PMHx PTSD,Anxiety,Depression EtOH abuse, Lung cancer, COPD,PAD  EMS was called the a.m. of 10/13 after the patient was found lying on the floor of his home by a friend. The house was cold. He had last been seen normal on 10/12 around noon. On EMS arrival he was noted to have ST elevation with left upper extremity trauma. He was found to be hypothermic and tremulous, he was transferred to [facility] emergency room ER evaluation: In ER complained of left arm pain, denied chest discomfort or shortness of breath. 12-lead showed atrial fibrillation, ST depression in V1 through V3, also concerns of nonspecific intraventricular conduction delay His initial temperature was 87.9 Fahrenheit, he was bradycardic, and had rapid respiratory rate. His initial lactic acid was 16, hemoglobin 8. A chest x-ray demonstrated fracture of the proximal left humerus. His troponin was mildly elevated. He was treated with warmed IV fluids, arm was immobilized, seen by Dr. Ellyn Hack who felt EKG was consistent with inferior MI. He recommended IV heparin, and supportive care for medical issues prior to further cardiac intervention. Given his degree of metabolic disarray critical care was asked to admit.    Subjective: 10/18 A/O 0. Upon entering room patient screaming that sharks are eating his legs.      Assessment & Plan:   Active Problems:   STEMI (ST elevation myocardial infarction) ([place])   SIRS/Aspiration Pneumonia -patient was found down on floor unknown amount time. Coughing up purulent sputum. He CXR showing bilateral interstitial/alveolar infiltrate:Pneumonia vs pneumonitis? Given patient's normal WBC, negative elevated Favor pneumonitis.  -will observe off of antibiotics. Patient spikes fever or leukocytosis, culture and start antibiotics for aspiration pneumonia     Acute Inferior STEMI -Although cardiology believes has a high probability of obstructive CAD, NOT a candidate for cardiac catheterization  -Echocardiogram; Limited without significant LV impairment. See results below -strict in and out -Daily weight -Transfuse for hemoglobin< 8  Bradycardia -Resolved    Paroxysmal Atrial fibrillation  -currently NSR     Rhabdomyolysis -continue lactated Ringer's 75 ml/hr    Recent Labs Lab 07/25/17 1800 07/26/17 0053 07/26/17 0629 07/27/17 1530 07/28/17 0234 07/30/17 0346  CKTOTAL 4,532* 5,332* 5,971* 2,172* 1,107* 484*  -resolving continue to monitor closely   Acute renal failure     Recent Labs Lab 07/25/17 1640 07/26/17 0629 07/27/17 0407 07/28/17 0234 07/29/17 0713 07/30/17 0346  CREATININE 1.11 0.82 0.57* 0.53* 0.51* 0.49*  -resolved    Acute urinary retention - now with delirium, continue Foley -Continue Flomax -Will not remove Foley until patient's delirium clears   Hypokalemia -Potassium goal> 4 -potassium IV 70 mEq  Hypomagnesemia -Magnesium goal> 2 -magnesium IV 2 g  Severe protein calorie malnutrition in setting of alcohol abuse -Advance diet as mental status allows, not presently safe to begin diet due to AMS -10/18 failed swallow study will need PEG prior to discharge to SNF if no improvement. For now requests placement of CoTrak tube.   Microcytic anemia    Recent Labs Lab 07/26/17 0629 07/27/17 0407 07/28/17 0234 07/28/17 0823 07/29/17 0028 07/30/17 0346  HGB 7.5* 7.6* 6.7* 8.6* 9.3* 8.2*  -stable continue to monitor closely -home any units PRBC transfused on this admission?  History of laryngeal and lung cancer records not currently available - reportedly treated 10 years ago      Hyperglycemia -10/16 Hemoglobin A1c= 5.5 not consistent  with prediabetes/diabetes   Acute delirium - History of alcohol abuse / EtOH withdrawal -CIWA protocol - hx from neighbors/friends suggest a baseline of  cognitive decline, perhaps due to EtOH, or possibly dementia of other cause - follow mental state    Left proximal humerus fracture -patient admitted on 10/13 now outside window for alcohol withdrawal. DC CIWA protoco  Goals of care -10/18 PT/OT consult: Patient with acute STEMI not candidate for invasive procedure. EtOH abuse with EtOH delirium, and rhabdomyolysis evaluate for CIR vs SNF vs LTAC    DVT prophylaxis: IV heparin Code Status: full Family Communication: one Disposition Plan: TBD   Consultants:  [facility]    Procedures/Significant Events:  10/14 Echocardiogram:Transthoracic echocardiography. Image   quality was poor. The study was technically difficult, as a   result of poor acoustic windows, poor patient compliance, and   restricted patient mobility, and a broken left arm in a sling - Left ventricle: Grossly normal LV function based on limited   subcostal views. Systolic function was normal. The estimated   ejection fraction was in the range of 60% to 65%. - Aorta: Poorly visualied - calcified leafelts, possible stenosis. - Right ventricle: The cavity size was mildly dilated. Mild   hypokinesis.    I have personally reviewed and interpreted all radiology studies and my findings are as above.  VENTILATOR SETTINGS:    Cultures   Antimicrobials: Anti-infectives    Start     Dose/Rate Route Frequency Ordered Stop   07/26/17 1000  vancomycin (VANCOCIN) IVPB 750 mg/150 ml premix  Status:  Discontinued     750 mg 150 mL/hr over 60 Minutes Intravenous Every 12 hours 07/26/17 0955 07/27/17 1147   07/25/17 2300  piperacillin-tazobactam (ZOSYN) IVPB 3.375 g  Status:  Discontinued     3.375 g 12.5 mL/hr over 240 Minutes Intravenous Every 8 hours 07/25/17 1619 07/27/17 1147   07/25/17 1630  vancomycin (VANCOCIN) IVPB 1000 mg/200 mL premix  Status:  Discontinued     1,000 mg 200 mL/hr over 60 Minutes Intravenous Every 24 hours 07/25/17 1619  07/26/17 0955   07/25/17 1630  piperacillin-tazobactam (ZOSYN) IVPB 3.375 g     3.375 g 100 mL/hr over 30 Minutes Intravenous  Once 07/25/17 1619 07/25/17 1708       Devices   LINES / TUBES:      Continuous Infusions: . lactated ringers 125 mL/hr at 07/30/17 0624  . potassium chloride 10 mEq (07/30/17 0713)     Objective: Vitals:   07/30/17 0414 07/30/17 0459 07/30/17 0625 07/30/17 0750  BP: (!) 156/61  (!) 149/73   Pulse: (!) 45 (!) 106  (!) 56  Resp: 17   18  Temp: (!) 97.4 F (36.3 C)   (!) 97 F (36.1 C)  TempSrc: Axillary   Axillary  SpO2: 94%   99%  Weight: 144 lb 8 oz (65.5 kg)     Height:        Intake/Output Summary (Last 24 hours) at 07/30/17 0832 Last data filed at 07/30/17 0624  Gross per 24 hour  Intake          2686.25 ml  Output             2000 ml  Net           686.25 ml   Filed Weights   07/28/17 0500 07/29/17 0410 07/30/17 0414  Weight: 136 lb 11 oz (62 kg) 145 lb 11.2 oz (66.1 kg) 144 lb 8  oz (65.5 kg)    Physical Exam:  General:A/O 0. Upon entering room patient screaming that sharks are eating his legs. No acute respiratory distress Neck:  Negative scars, masses, torticollis, lymphadenopathy, JVD Lungs: decreased bibasilar breath sounds Rt>>>Lt, negative wheezing Cardiovascular: Regular rate and rhythm without murmur gallop or rub normal S1 and S2 Abdomen: negative abdominal pain, nondistended, positive soft, bowel sounds, no rebound, no ascites, no appreciable mass Extremities: No significant cyanosis, clubbing, or edema bilateral lower extremities Skin: multiple lacerations bilateral lower extremities, extensive bruising and swelling LUE/shoulder onsistent with Psychiatric:  Unable to assess secondary to altered mental status Central nervous system:  Cranial nerves II through XII intact, tongue/uvula midline. Moves all extremities spontaneously. Follow somec ommands.Unable to assess secondary to altered mental status,  .    Data  Reviewed: Care during the described time interval was provided by me .  I have reviewed this patient's available data, including medical history, events of note, physical examination, and all test results as part of my evaluation.   CBC:  Recent Labs Lab 07/25/17 1215  07/26/17 0629 07/27/17 0407 07/28/17 0234 07/28/17 0823 07/29/17 0028 07/30/17 0346  WBC 18.8*  --  9.6 8.4 7.1  --  8.4 7.7  NEUTROABS 15.8*  --   --   --   --   --   --   --   HGB 8.4*  < > 7.5* 7.6* 6.7* 8.6* 9.3* 8.2*  HCT 28.6*  < > 25.3* 25.0* 22.6* 28.1* 29.0* 27.2*  MCV 76.1*  --  74.4* 74.0* 73.9*  --  74.7* 75.1*  PLT 324  --  209 182 239  --  239 231  < > = values in this interval not displayed. Basic Metabolic Panel:  Recent Labs Lab 07/26/17 0629 07/26/17 1031 07/27/17 0407 07/28/17 0234 07/28/17 1539 07/29/17 0713 07/30/17 0346  NA 139  --  138 138  --  137 137  K 3.4*  --  3.5 2.7* 4.0 3.4* 3.2*  CL 105  --  104 105  --  104 103  CO2 21*  --  24 24  --  24 22  GLUCOSE 89  --  88 97  --  85 77  BUN 12  --  9 5*  --  5* <5*  CREATININE 0.82  --  0.57* 0.53*  --  0.51* 0.49*  CALCIUM 8.0*  --  7.8* 7.4*  --  7.8* 7.5*  MG  --  1.8  --   --   --  1.6* 1.6*   GFR: Estimated Creatinine Clearance: 77.3 mL/min (A) (by C-G formula based on SCr of 0.49 mg/dL (L)). Liver Function Tests:  Recent Labs Lab 07/25/17 1215 07/25/17 1640 07/28/17 0234  AST 95* 110* 81*  ALT 23 22 30   ALKPHOS 123 104 112  BILITOT 0.9 0.8 0.7  PROT 5.8* 5.2* 4.6*  ALBUMIN 2.7* 2.6* 2.0*   No results for input(s): LIPASE, AMYLASE in the last 168 hours. No results for input(s): AMMONIA in the last 168 hours. Coagulation Profile:  Recent Labs Lab 07/25/17 1215  INR 1.29   Cardiac Enzymes:  Recent Labs Lab 07/26/17 0053 07/26/17 0629 07/27/17 1530 07/28/17 0234 07/30/17 0346  CKTOTAL 5,332* 5,971* 2,172* 1,107* 484*  CKMB  --   --  12.4*  --   --   TROPONINI  --   --  6.47*  --   --    BNP (last 3  results) No results for input(s): PROBNP  in the last 8760 hours. HbA1C:  Recent Labs  07/28/17 0235  HGBA1C 5.5   CBG: No results for input(s): GLUCAP in the last 168 hours. Lipid Profile:  Recent Labs  07/28/17 0234  CHOL 105  HDL 21*  LDLCALC 63  TRIG 106  CHOLHDL 5.0   Thyroid Function Tests: No results for input(s): TSH, T4TOTAL, FREET4, T3FREE, THYROIDAB in the last 72 hours. Anemia Panel:  Recent Labs  07/28/17 0234 07/28/17 0237  VITAMINB12 479  --   FOLATE  --  12.3  FERRITIN 53  --   TIBC 225*  --   IRON 8*  --   RETICCTPCT 1.9  --    Urine analysis:    Component Value Date/Time   COLORURINE YELLOW 07/25/2017 1440   APPEARANCEUR CLEAR 07/25/2017 1440   LABSPEC 1.010 07/25/2017 1440   PHURINE 6.0 07/25/2017 1440   GLUCOSEU NEGATIVE 07/25/2017 1440   HGBUR LARGE (A) 07/25/2017 1440   BILIRUBINUR NEGATIVE 07/25/2017 1440   KETONESUR 20 (A) 07/25/2017 1440   PROTEINUR 30 (A) 07/25/2017 1440   UROBILINOGEN 0.2 04/30/2014 1350   NITRITE NEGATIVE 07/25/2017 1440   LEUKOCYTESUR NEGATIVE 07/25/2017 1440   Sepsis Labs: @LABRCNTIP (procalcitonin:4,lacticidven:4)  ) Recent Results (from the past 240 hour(s))  Culture, blood (routine x 2)     Status: None (Preliminary result)   Collection Time: 07/25/17 12:15 PM  Result Value Ref Range Status   Specimen Description BLOOD RIGHT ANTECUBITAL  Final   Special Requests   Final    BOTTLES DRAWN AEROBIC AND ANAEROBIC Blood Culture adequate volume   Culture NO GROWTH 4 DAYS  Final   Report Status PENDING  Incomplete  Urine culture     Status: None   Collection Time: 07/25/17  2:40 PM  Result Value Ref Range Status   Specimen Description URINE, CATHETERIZED  Final   Special Requests NONE  Final   Culture NO GROWTH  Final   Report Status 07/27/2017 FINAL  Final  MRSA PCR Screening     Status: None   Collection Time: 07/25/17  6:11 PM  Result Value Ref Range Status   MRSA by PCR NEGATIVE NEGATIVE Final     Comment:        The GeneXpert MRSA Assay (FDA approved for NASAL specimens only), is one component of a comprehensive MRSA colonization surveillance program. It is not intended to diagnose MRSA infection nor to guide or monitor treatment for MRSA infections.   Culture, blood (routine x 2)     Status: None (Preliminary result)   Collection Time: 07/25/17  6:34 PM  Result Value Ref Range Status   Specimen Description BLOOD RIGHT HAND  Final   Special Requests IN PEDIATRIC BOTTLE Blood Culture adequate volume  Final   Culture NO GROWTH 3 DAYS  Final   Report Status PENDING  Incomplete         Radiology Studies: Dg Chest Port 1 View  Result Date: 07/30/2017 CLINICAL DATA:  Shortness of breath, STEMI, pneumonia. History of [place] OPD, current smoker. EXAM: PORTABLE CHEST 1 VIEW COMPARISON:  Chest x-ray of July 26, 2017 FINDINGS: The lungs remain well-expanded. There is new increased interstitial density at both bases greatest on the right. The hemidiaphragms are partially obscured. There is new increased interstitial density in the left upper lobe as well. The heart is top-normal in size. The pulmonary vascularity is not clearly engorged. There is calcification in the wall of the aortic arch. The pulmonary vascularity is not engorged. IMPRESSION: Interval  development of bilateral interstitial and early alveolar infiltrates. Probable small bilateral pleural effusions. No significant pulmonary vascular congestion. Thoracic aortic atherosclerosis. Electronically Signed   By: David  Martinique M.D.   On: 07/30/2017 07:48        Scheduled Meds: . aspirin EC  81 mg Oral Daily  . atorvastatin  40 mg Oral q1800  . chlorhexidine  15 mL Mouth Rinse BID  . clopidogrel  75 mg Oral Daily  . enoxaparin (LOVENOX) injection  30 mg Subcutaneous Q24H  . folic acid  1 mg Intravenous Daily  . Influenza vac split quadrivalent PF  0.5 mL Intramuscular Tomorrow-1000  . LORazepam  0.5 mg Oral BID  .  mouth rinse  15 mL Mouth Rinse BID  . metoprolol tartrate  2.5 mg Intravenous Q6H  . pneumococcal 23 valent vaccine  0.5 mL Intramuscular Tomorrow-1000  . thiamine  100 mg Intravenous Daily  . traZODone  150 mg Oral QHS   Continuous Infusions: . lactated ringers 125 mL/hr at 07/30/17 0624  . potassium chloride 10 mEq (07/30/17 0713)     LOS: 5 days    Time spent: 40 minutes    WOODS, Geraldo Docker, MD [facility] Pager [phone]   If 7PM-7AM, please contact night-coverage [email or web address] Password [email or web address] 07/30/2017, 8:32 AM

## 2017-07-30 NOTE — Plan of Care (Signed)
Problem: Activity: Goal: Risk for activity intolerance will decrease Outcome: Not Progressing Pt is high fall risk with poor judgement and poor safety awareness. Bed alarm and room close to nurses station implemented to promote safety

## 2017-07-31 DIAGNOSIS — D509 Iron deficiency anemia, unspecified: Secondary | ICD-10-CM

## 2017-07-31 LAB — GLUCOSE, CAPILLARY
GLUCOSE-CAPILLARY: 123 mg/dL — AB (ref 65–99)
GLUCOSE-CAPILLARY: 69 mg/dL (ref 65–99)
GLUCOSE-CAPILLARY: 79 mg/dL (ref 65–99)
GLUCOSE-CAPILLARY: 95 mg/dL (ref 65–99)
Glucose-Capillary: 77 mg/dL (ref 65–99)
Glucose-Capillary: 74 mg/dL (ref 65–99)
Glucose-Capillary: 141 mg/dL — ABNORMAL HIGH (ref 65–99)

## 2017-07-31 LAB — BASIC METABOLIC PANEL
ANION GAP: 14 (ref 5–15)
BUN: <5 mg/dL — ABNORMAL LOW (ref 6–20)
CHLORIDE: 103 mmol/L (ref 101–111)
CO2: 18 mmol/L — AB (ref 22–32)
Calcium: 7.5 mg/dL — ABNORMAL LOW (ref 8.9–10.3)
Creatinine, Ser: 0.58 mg/dL — ABNORMAL LOW (ref 0.61–1.24)
GFR calc Af Amer: >60 mL/min (ref >60)
GLUCOSE: 84 mg/dL (ref 65–99)
POTASSIUM: 3.3 mmol/L — AB (ref 3.5–5.1)
Sodium: 135 mmol/L (ref 135–145)

## 2017-07-31 LAB — CULTURE, BLOOD (ROUTINE X 2)
CULTURE: NO GROWTH
SPECIAL REQUESTS: ADEQUATE

## 2017-07-31 LAB — CBC
HEMATOCRIT: 27.1 % — AB (ref 39.0–52.0)
HEMOGLOBIN: 8.3 g/dL — AB (ref 13.0–17.0)
MCH: 23.1 pg — AB (ref 26.0–34.0)
MCHC: 30.6 g/dL (ref 30.0–36.0)
MCV: 75.3 fL — AB (ref 78.0–100.0)
Platelets: 272 10*3/uL (ref 150–400)
RBC: 3.6 MIL/uL — AB (ref 4.22–5.81)
RDW: 19.9 % — ABNORMAL HIGH (ref 11.5–15.5)
WBC: 8.9 10*3/uL (ref 4.0–10.5)

## 2017-07-31 LAB — MAGNESIUM: Magnesium: 1.7 mg/dL (ref 1.7–2.4)

## 2017-07-31 LAB — CK: Total CK: 334 U/L (ref 49–397)

## 2017-07-31 MED ORDER — PRO-STAT SUGAR FREE PO LIQD
30.0000 mL | Freq: Every day | ORAL | Status: DC
  Administered 2017-07-31 – 2017-08-03 (×4): 30 mL
  Filled 2017-07-31 (×4): qty 30

## 2017-07-31 MED ORDER — POTASSIUM CHLORIDE 10 MEQ/100ML IV SOLN
10.0000 meq | INTRAVENOUS | Status: AC
  Administered 2017-07-31 – 2017-08-01 (×6): 10 meq via INTRAVENOUS
  Filled 2017-07-31 (×6): qty 100

## 2017-07-31 MED ORDER — MAGNESIUM SULFATE IN D5W 1-5 GM/100ML-% IV SOLN
1.0000 g | Freq: Once | INTRAVENOUS | Status: AC
  Administered 2017-07-31: 1 g via INTRAVENOUS
  Filled 2017-07-31: qty 100

## 2017-07-31 MED ORDER — JEVITY 1.2 CAL PO LIQD
1000.0000 mL | ORAL | Status: DC
  Administered 2017-07-31: 20 mL
  Administered 2017-08-01 – 2017-08-02 (×2): 1000 mL
  Filled 2017-07-31 (×7): qty 1000

## 2017-07-31 NOTE — Progress Notes (Addendum)
  Speech Language Pathology Treatment: Dysphagia  Patient Details Name: David Lucero MRN: [id number] DOB: Sep 03, 1946 Today's Date: 07/31/2017 Time: 7209-4709 SLP Time Calculation (min) (ACUTE ONLY): 13 min  Assessment / Plan / Recommendation Clinical Impression  Pt is alert and somewhat oriented today (says his name, knows he's at [facility]). He also follows some one-step commands. He still shows s/s of dysphagia and decreased airway protection, including minimal hyolaryngeal movement to palpation, multiple swallows, and immediate coughing per bolus. I continue to suspect perhaps some baseline dysphagia that has been exacerbated by his acute decline. Although still quite confused, his mentation is improving and therefore he would benefit from [facility] as able to assess swallowing.    HPI HPI: 71 year old male patient with multiple comorbidities including: anxiety, alcohol abuse,GERD, COPD, PTSD, PAD, depression, lung cancer, laryngeal cancer, chronic obstructive pulmonary disease. Pt found down by a friend. CXR no acute cardiopulmonary abnormality seen. Imaging of humerus revealed moderately displaced and comminuted proximal left humeral head and neck fracture is noted. Found to have acute inferior ST elevation myocardial infarction, severe lactic acidosis, A-fib, hypokalemia, severe malnutrition. RN reported pt pocketing food.       SLP Plan  MBS       Recommendations  Diet recommendations: NPO Medication Administration: Via alternative means                Oral Care Recommendations: Oral care QID Follow up Recommendations: Skilled Nursing facility SLP Visit Diagnosis: Dysphagia, oropharyngeal phase (R13.12) Plan: MBS       GO                Germain Osgood 07/31/2017, 3:18 PM  Germain Osgood, M.A. CCC-SLP [phone]

## 2017-07-31 NOTE — Evaluation (Signed)
Physical Therapy Evaluation Patient Details Name: David Lucero MRN: [id number] DOB: March 19, 1946 Today's Date: 07/31/2017   History of Present Illness  This 71 y.o. male admitted after being found on floor of home by a friend.  He was found to by hypoterhmic.  WF:UXNAT; x-ray/CT showed Lt humerus fx (nonoperative), SIRS/aspiration PNA, rhabdomyolosis, Acute renal failure.  PMH includes:  COPD, PTSD, anxiety, depression, ETOH abuse, lung CA  Clinical Impression  Pt admitted secondary to problem above with deficits below. Unable to collect PLOF information secondary to AMS, however, per chart pt lived alone. Upon eval, pt presenting with LUE pain, pain in his toes in supine, however, not in standing, weakness, decreased balance, and decreased activity tolerance. Pt fatiguing easy in standing. Required mod-max A +2 for mobility tasks this session. Unsure of support available at home and pt is currently a high fall risk. Recommending SNF at d/c to increase independence and safety with functional mobility.     Follow Up Recommendations SNF;Supervision/Assistance - 24 hour    Equipment Recommendations  Wheelchair (measurements PT);Wheelchair cushion (measurements PT)    Recommendations for Other Services       Precautions / Restrictions Precautions Precautions: Fall;Other (comment) Precaution Comments: Sling to LT UE due to humeral fx  Restrictions Weight Bearing Restrictions: Yes LUE Weight Bearing: Non weight bearing      Mobility  Bed Mobility Overal bed mobility: Needs Assistance Bed Mobility: Supine to Sit;Sit to Supine     Supine to sit: +2 for physical assistance;Total assist Sit to supine: Max assist;+2 for physical assistance   General bed mobility comments: Pt requires assist for all aspects.  He was able to assist minimally with lowering trunk toward bed   Transfers Overall transfer level: Needs assistance Equipment used: 2 person hand held assist Transfers: Sit  to/from Stand Sit to Stand: Mod assist;+2 physical assistance         General transfer comment: Pt requires assist to power up into standing and for steadying. Demonstrated posterior lean in standing and required assist to maintain upright. Verbal cues for knee extension and upright posture.   Ambulation/Gait             General Gait Details: Attempted to march as pre gait activity, however, pt unable secondary to decreased balance.   Stairs            Wheelchair Mobility    Modified Rankin (Stroke Patients Only)       Balance Overall balance assessment: Needs assistance Sitting-balance support: Feet supported Sitting balance-Leahy Scale: Poor Sitting balance - Comments: Pt requires min A - close min guard assist for static sitting EOB  Postural control: Posterior lean Standing balance support: Single extremity supported Standing balance-Leahy Scale: Poor Standing balance comment: Pt requires mod A +2 to maintain static standing for ~4 mins before fatiguing                              Pertinent Vitals/Pain Pain Assessment: Faces Faces Pain Scale: Hurts even more Pain Location: with movemement - Lt UE, bil. feet/toes, ribs  Pain Descriptors / Indicators: Grimacing;Guarding;Moaning Pain Intervention(s): Limited activity within patient's tolerance;Monitored during session;Repositioned    Home Living Family/patient expects to be discharged to:: Skilled nursing facility                 Additional Comments: Per CM notes  Pt lived alone in unkept house.  Neighbors assisted him with meals PTA  Prior Function           Comments: assume pt was independent with basic ADLs and functional mobility, but unsure how  safe he was, nor if he had a fall h/o - multiple scaps, abrasions noted on bil. LEs.  Per CM notes, neighbors report pt's house was unkept, and they provided one meal/day to pt         Hand Dominance   Dominant Hand: Right     Extremity/Trunk Assessment   Upper Extremity Assessment Upper Extremity Assessment: Defer to OT evaluation LUE Deficits / Details: Lt UE immobilized with sling     Lower Extremity Assessment Lower Extremity Assessment: LLE deficits/detail;RLE deficits/detail;Generalized weakness RLE Deficits / Details: Pt with grimacing and guarding when OT touching toes, especially big toe. Functional weakness noted, as pt requiring assist for mobility tasks.  LLE Deficits / Details: Pt with grimacing and guarding when OT touching toes, especially big toe. Functional weakness noted, as pt requiring assist for mobility tasks.     Cervical / Trunk Assessment Cervical / Trunk Assessment: Kyphotic  Communication   Communication: Expressive difficulties (slow to respond )  Cognition Arousal/Alertness: Awake/alert Behavior During Therapy: WFL for tasks assessed/performed Overall Cognitive Status: Impaired/Different from baseline Area of Impairment: Orientation;Attention;Memory;Following commands;Safety/judgement;Problem solving                 Orientation Level: Disoriented to;Place;Time;Situation Current Attention Level: Focused Memory: Decreased short-term memory;Decreased recall of precautions Following Commands: Follows one step commands inconsistently;Follows one step commands with increased time Safety/Judgement: Decreased awareness of deficits;Decreased awareness of safety   Problem Solving: Slow processing;Decreased initiation;Difficulty sequencing;Requires verbal cues;Requires tactile cues General Comments: Pt intermittently oriented to situation.        General Comments General comments (skin integrity, edema, etc.): HR to 114 with activity. No family present during session.     Exercises     Assessment/Plan    PT Assessment Patient needs continued PT services  PT Problem List Decreased strength;Decreased balance;Decreased mobility;Decreased cognition;Decreased knowledge of use  of DME;Decreased safety awareness;Decreased knowledge of precautions;Pain       PT Treatment Interventions DME instruction;Gait training;Functional mobility training;Therapeutic activities;Therapeutic exercise;Neuromuscular re-education;Balance training;Cognitive remediation;Patient/family education    PT Goals (Current goals can be found in the Care Plan section)  Acute Rehab PT Goals Patient Stated Goal: to go home  PT Goal Formulation: With patient Time For Goal Achievement: 08/14/17 Potential to Achieve Goals: Fair    Frequency Min 2X/week   Barriers to discharge        Co-evaluation PT/OT/SLP Co-Evaluation/Treatment: Yes Reason for Co-Treatment: Complexity of the patient's impairments (multi-system involvement);Necessary to address cognition/behavior during functional activity;For patient/therapist safety;To address functional/ADL transfers PT goals addressed during session: Mobility/safety with mobility;Balance OT goals addressed during session: ADL's and self-care;Strengthening/ROM       AM-PAC PT "6 Clicks" Daily Activity  Outcome Measure Difficulty turning over in bed (including adjusting bedclothes, sheets and blankets)?: Unable Difficulty moving from lying on back to sitting on the side of the bed? : Unable Difficulty sitting down on and standing up from a chair with arms (e.g., wheelchair, bedside commode, etc,.)?: Unable Help needed moving to and from a bed to chair (including a wheelchair)?: Total Help needed walking in hospital room?: Total Help needed climbing 3-5 steps with a railing? : Total 6 Click Score: 6    End of Session Equipment Utilized During Treatment: Gait belt;Other (comment) (LUE sling ) Activity Tolerance: Patient tolerated treatment well Patient left: in bed;with call bell/phone within  reach;with bed alarm set Nurse Communication: Mobility status PT Visit Diagnosis: Other abnormalities of gait and mobility (R26.89);Muscle weakness  (generalized) (M62.81);Unsteadiness on feet (R26.81);Pain Pain - Right/Left: Left Pain - part of body: Shoulder (toes bilaterally )    Time: 1339-1400 PT Time Calculation (min) (ACUTE ONLY): 21 min   Charges:   PT Evaluation $PT Eval Moderate Complexity: 1 Mod     PT G Codes:        Leighton Ruff, PT, DPT  Acute Rehabilitation Services  Pager: [phone]   Rudean Hitt 07/31/2017, 3:17 PM

## 2017-07-31 NOTE — Progress Notes (Signed)
Cortrak Tube Team Note:  Consult received to place a Cortrak feeding tube.   A 10 F Cortrak tube was placed in the L nare and secured with a nasal bridle at 84 cm. Per the Cortrak monitor reading the tube tip is post pyloric.  RN at bedside during placement.  No x-ray is required. RN may begin using tube.   If the tube becomes dislodged please keep the tube and contact the Cortrak team at [email or web address] (password [email or web address]) for replacement.  If after hours and replacement cannot be delayed, place a NG tube and confirm placement with an abdominal x-ray.    [place], [place], [place] Pager [phone] After Hours Pager

## 2017-07-31 NOTE — Progress Notes (Addendum)
Initial Nutrition Assessment  DOCUMENTATION CODES:   Severe malnutrition in context of chronic illness  INTERVENTION:  - Begin Jevity 1.2 TF at 20 mL/hr. Increase feeding by 10 mL/hr every 4 hours to increased goal of 65 mL/hr (1560 mL daily) via Cortrak  - Add 30 mL Pro-Stat daily - Tube feeding and Pro-Stat regimen at goal rate provides 1972 kcal, 102 grams of protein, and 1264 ml of H2O.  - Recommend monitoring magnesium, potassium, and phosphorus daily for at least 3 days, MD to replete as needed, as pt may be at risk for refeeding syndrome given malnutrition status.  NUTRITION DIAGNOSIS:   Malnutrition (severe) related to chronic illness (COPD/EtOH abuse) as evidenced by severe depletion of muscle mass, severe depletion of body fat.  GOAL:   Patient will meet greater than or equal to 90% of their needs  MONITOR:   Weight trends, Vent status, TF tolerance, I & O's  REASON FOR ASSESSMENT:   Consult Enteral/tube feeding initiation and management  ASSESSMENT:   Pt with PMH of PTSD, PAD, COPD, EtOH abuse, hx of throat and lung cancer presents with STEMI, humerus fracture, rhabdomyolysis   Pt unable to fully answer RD questions at time. No family at bedside to assist in obtaining nutritional history.  Pt failed SLP evaluation given AMS. Cortrak feeding tube placed by RD this AM.  Per chart, pt's weight has fluctuated over the past year.   Labs reviewed; K 3.3, Hemoglobin 8.3, Magnesium WNL up from 1.6 yesterday, recommend obtaining phosphorus Medications reviewed; folic acid, thiamine Nutrition focused physical exam completed. Findings include severe fat depletion, severe muscle depletion and no edema.   Diet Order:  Diet NPO time specified  Skin:  Reviewed, no issues  Last BM:  Unknown BM date  Height:   Ht Readings from Last 1 Encounters:  07/25/17 5\' 7"  (1.702 m)    Weight:   Wt Readings from Last 1 Encounters:  07/31/17 145 lb 14.4 oz (66.2 kg)    Ideal  Body Weight:  67.3 kg  BMI:  Body mass index is 22.85 kg/m.  Estimated Nutritional Needs:   Kcal:  1800-2000  Protein:  90-100 grams  Fluid:  >/= 1.8 L/d  EDUCATION NEEDS:   Education needs no appropriate at this time  [place] Ranger, MS, RDN, LDN 07/31/2017 1:19 PM

## 2017-07-31 NOTE — Progress Notes (Signed)
PROGRESS NOTE    David Lucero  JEH:[id number] DOB: Mar 17, 1946 DOA: 07/25/2017 PCP: [name], MD   Brief Narrative:  71 year old WM PMHx PTSD,Anxiety,Depression EtOH abuse, Lung cancer, COPD,PAD  EMS was called the a.m. of 10/13 after the patient was found lying on the floor of his home by a friend. The house was cold. He had last been seen normal on 10/12 around noon. On EMS arrival he was noted to have ST elevation with left upper extremity trauma. He was found to be hypothermic and tremulous, he was transferred to [facility] emergency room ER evaluation: In ER complained of left arm pain, denied chest discomfort or shortness of breath. 12-lead showed atrial fibrillation, ST depression in V1 through V3, also concerns of nonspecific intraventricular conduction delay His initial temperature was 87.9 Fahrenheit, he was bradycardic, and had rapid respiratory rate. His initial lactic acid was 16, hemoglobin 8. A chest x-ray demonstrated fracture of the proximal left humerus. His troponin was mildly elevated. He was treated with warmed IV fluids, arm was immobilized, seen by Dr. Ellyn Hack who felt EKG was consistent with inferior MI. He recommended IV heparin, and supportive care for medical issues prior to further cardiac intervention. Given his degree of metabolic disarray critical care was asked to admit.    Subjective: 10/19  A/O 1 (does not know where, when, why), follows commands.        Assessment & Plan:   Active Problems:   STEMI (ST elevation myocardial infarction) ([place])   SIRS/Aspiration Pneumonia -patient was found down on floor unknown amount time. Coughing up purulent sputum. He CXR showing bilateral interstitial/alveolar infiltrate:Pneumonia vs pneumonitis? Given patient's normal WBC, negative elevated Favor pneumonitis.  -MAXIMUM TEMPERATURE last 24 hours: Afebrile, leukocytosis resolved.on exam respiratory status improved.   Acute Inferior STEMI -Although  cardiology believes has a high probability of obstructive CAD, NOT a candidate for cardiac catheterization  -Echocardiogram; Limited without significant LV impairment. See results below -strict in and out since admission +11 L -Daily weight Filed Weights   07/29/17 0410 07/30/17 0414 07/31/17 0426  Weight: 145 lb 11.2 oz (66.1 kg) 144 lb 8 oz (65.5 kg) 145 lb 14.4 oz (66.2 kg)  -Transfuse for hemoglobin< 8  Bradycardia -resolved    Paroxysmal Atrial fibrillation  -currently in NSR     Rhabdomyolysis -continue lactated Ringer's at 47ml/hr until patient at goal for tube feeds    Recent Labs Lab 07/26/17 0053 07/26/17 0629 07/27/17 1530 07/28/17 0234 07/30/17 0346 07/31/17 0759  CKTOTAL 5,332* 5,971* 2,172* 1,107* 484* 334  -resolved   Acute renal failure     Recent Labs Lab 07/26/17 0629 07/27/17 0407 07/28/17 0234 07/29/17 0713 07/30/17 0346 07/31/17 0759  CREATININE 0.82 0.57* 0.53* 0.51* 0.49* 0.58*  -resolved    Acute urinary retention - continue Foley until delirium clears -Continue Flomax   Hypokalemia -Potassium goal> 4 -Potassium IV 60 mEq  Hypomagnesemia -Magnesium goal> 2 -magnesium IV 1 g  Severe protein calorie malnutrition in setting of alcohol abuse -Advance diet as mental status allows, not presently safe to begin diet due to AMS -10/18 failed swallow study will need PEG prior to discharge to SNF. -10/19 S/P CorTrak placement; Tube feeds per nutrition   Microcytic anemia     Recent Labs Lab 07/27/17 0407 07/28/17 0234 07/28/17 0823 07/29/17 0028 07/30/17 0346 07/31/17 0759  HGB 7.6* 6.7* 8.6* 9.3* 8.2* 8.3*  -stable continue to monitor closely -how many units PRBC transfused on this admission?  History of laryngeal and  lung cancer -records not currently available - reportedly treated 10 years ago      Hyperglycemia -10/16 Hemoglobin A1c= 5.5 not consistent with prediabetes/diabetes   Acute delirium - History of alcohol abuse  / EtOH withdrawal -CIWA protocol DC'd.  -neighbor/friend suggested cognitive decline secondary EtOH? vs dementia?  -Delirium persists; Unsure of baseline    Left proximal humerus fracture -patient admitted on 10/13 now outside window for alcohol withdrawal. DC CIWA protoco  Goals of care -10/18 PT/OT consult: Patient with acute STEMI not candidate for invasive procedure. EtOH abuse with EtOH delirium, and rhabdomyolysis evaluate for CIR vs SNF vs LTAC -10/19 attempted to contact brother using phone number and demographics, no answer. Consult to LCSW will need to track down brother prior to placement   DVT prophylaxis: IV heparin Code Status: full Family Communication: one Disposition Plan: TBD   Consultants:  [facility]    Procedures/Significant Events:  10/14 Echocardiogram:-LVEF 60% to 65%. - Aorta: Poorly visualied - calcified leafelts, possible stenosis. - Right ventricle: The cavity size was mildly dilated. Mild   hypokinesis. 10/19 CorTrak tube placed   I have personally reviewed and interpreted all radiology studies and my findings are as above.  VENTILATOR SETTINGS:    Cultures   Antimicrobials: Anti-infectives    Start     Stop   07/26/17 1000  vancomycin (VANCOCIN) IVPB 750 mg/150 ml premix  Status:  Discontinued     07/27/17 1147   07/25/17 2300  piperacillin-tazobactam (ZOSYN) IVPB 3.375 g  Status:  Discontinued     07/27/17 1147   07/25/17 1630  vancomycin (VANCOCIN) IVPB 1000 mg/200 mL premix  Status:  Discontinued     07/26/17 0955   07/25/17 1630  piperacillin-tazobactam (ZOSYN) IVPB 3.375 g     07/25/17 1708       Devices   LINES / TUBES:  CorTrak tube 10/19>>>    Continuous Infusions: . feeding supplement (JEVITY 1.2 CAL) 20 mL (07/31/17 1409)  . lactated ringers 75 mL/hr at 07/31/17 1115     Objective: Vitals:   07/31/17 0426 07/31/17 0800 07/31/17 1123 07/31/17 1629  BP: (!) 139/52 132/81 (!) 146/63 (!)  166/60  Pulse:  (!) 48 (!) 107 (!) 57  Resp: (!) 22 17 (!) 27 (!) 21  Temp: (!) 97.5 F (36.4 C) 99.5 F (37.5 C) 98.2 F (36.8 C) 98.1 F (36.7 C)  TempSrc: Oral Axillary Oral Oral  SpO2: 96% 95% 94% 93%  Weight: 145 lb 14.4 oz (66.2 kg)     Height:        Intake/Output Summary (Last 24 hours) at 07/31/17 1705 Last data filed at 07/31/17 0900  Gross per 24 hour  Intake          1404.16 ml  Output             1850 ml  Net          -445.84 ml   Filed Weights   07/29/17 0410 07/30/17 0414 07/31/17 0426  Weight: 145 lb 11.2 oz (66.1 kg) 144 lb 8 oz (65.5 kg) 145 lb 14.4 oz (66.2 kg)    Physical Exam:  General: A/O 1 (does not know where, when, why), cooperative, No acute respiratory distress ENT: Negative Runny nose, negative gingival bleeding,CorTrack tube present Lungs: Clear to auscultation bilaterally without wheezes or crackles Cardiovascular: Regular rate and rhythm without murmur gallop or rub normal S1 and S2 Abdomen: negative abdominal pain, nondistended, positive soft, bowel sounds, no rebound, no  ascites, no appreciable mass Extremities: No significant cyanosis, clubbing, or edema bilateral lower extremities Skin: Negative rashes, lesions, ulcers Psychiatric:  Unable to assess secondary to altered mental status  Central nervous system:  Cranial nerves II through XII intact, tongue/uvula midline, all extremities muscle strength 5/5, sensation intact throughout, positive dysarthria, negative expressive aphasia, negative receptive aphasia.  .    Data Reviewed: Care during the described time interval was provided by me .  I have reviewed this patient's available data, including medical history, events of note, physical examination, and all test results as part of my evaluation.   CBC:  Recent Labs Lab 07/25/17 1215  07/27/17 0407 07/28/17 0234 07/28/17 1610 07/29/17 0028 07/30/17 0346 07/31/17 0759  WBC 18.8*  < > 8.4 7.1  --  8.4 7.7 8.9  NEUTROABS 15.8*   --   --   --   --   --   --   --   HGB 8.4*  < > 7.6* 6.7* 8.6* 9.3* 8.2* 8.3*  HCT 28.6*  < > 25.0* 22.6* 28.1* 29.0* 27.2* 27.1*  MCV 76.1*  < > 74.0* 73.9*  --  74.7* 75.1* 75.3*  PLT 324  < > 182 239  --  239 231 272  < > = values in this interval not displayed. Basic Metabolic Panel:  Recent Labs Lab 07/26/17 1031 07/27/17 0407 07/28/17 0234 07/28/17 1539 07/29/17 0713 07/30/17 0346 07/31/17 0759  NA  --  138 138  --  137 137 135  K  --  3.5 2.7* 4.0 3.4* 3.2* 3.3*  CL  --  104 105  --  104 103 103  CO2  --  24 24  --  24 22 18*  GLUCOSE  --  88 97  --  85 77 84  BUN  --  9 5*  --  5* <5* <5*  CREATININE  --  0.57* 0.53*  --  0.51* 0.49* 0.58*  CALCIUM  --  7.8* 7.4*  --  7.8* 7.5* 7.5*  MG 1.8  --   --   --  1.6* 1.6* 1.7   GFR: Estimated Creatinine Clearance: 78 mL/min (A) (by C-G formula based on SCr of 0.58 mg/dL (L)). Liver Function Tests:  Recent Labs Lab 07/25/17 1215 07/25/17 1640 07/28/17 0234  AST 95* 110* 81*  ALT 23 22 30   ALKPHOS 123 104 112  BILITOT 0.9 0.8 0.7  PROT 5.8* 5.2* 4.6*  ALBUMIN 2.7* 2.6* 2.0*   No results for input(s): LIPASE, AMYLASE in the last 168 hours. No results for input(s): AMMONIA in the last 168 hours. Coagulation Profile:  Recent Labs Lab 07/25/17 1215  INR 1.29   Cardiac Enzymes:  Recent Labs Lab 07/26/17 0629 07/27/17 1530 07/28/17 0234 07/30/17 0346 07/31/17 0759  CKTOTAL 5,971* 2,172* 1,107* 484* 334  CKMB  --  12.4*  --   --   --   TROPONINI  --  6.47*  --   --   --    BNP (last 3 results) No results for input(s): PROBNP in the last 8760 hours. HbA1C: No results for input(s): HGBA1C in the last 72 hours. CBG:  Recent Labs Lab 07/31/17 0034 07/31/17 0249 07/31/17 0402 07/31/17 0802 07/31/17 1126  GLUCAP 69 77 74 79 95   Lipid Profile: No results for input(s): CHOL, HDL, LDLCALC, TRIG, CHOLHDL, LDLDIRECT in the last 72 hours. Thyroid Function Tests: No results for input(s): TSH, T4TOTAL,  FREET4, T3FREE, THYROIDAB in the last 72 hours. Anemia  Panel: No results for input(s): VITAMINB12, FOLATE, FERRITIN, TIBC, IRON, RETICCTPCT in the last 72 hours. Urine analysis:    Component Value Date/Time   COLORURINE YELLOW 07/25/2017 1440   APPEARANCEUR CLEAR 07/25/2017 1440   LABSPEC 1.010 07/25/2017 1440   PHURINE 6.0 07/25/2017 1440   GLUCOSEU NEGATIVE 07/25/2017 1440   HGBUR LARGE (A) 07/25/2017 1440   BILIRUBINUR NEGATIVE 07/25/2017 1440   KETONESUR 20 (A) 07/25/2017 1440   PROTEINUR 30 (A) 07/25/2017 1440   UROBILINOGEN 0.2 04/30/2014 1350   NITRITE NEGATIVE 07/25/2017 1440   LEUKOCYTESUR NEGATIVE 07/25/2017 1440   Sepsis Labs: @LABRCNTIP (procalcitonin:4,lacticidven:4)  ) Recent Results (from the past 240 hour(s))  Culture, blood (routine x 2)     Status: None   Collection Time: 07/25/17 12:15 PM  Result Value Ref Range Status   Specimen Description BLOOD RIGHT ANTECUBITAL  Final   Special Requests   Final    BOTTLES DRAWN AEROBIC AND ANAEROBIC Blood Culture adequate volume   Culture NO GROWTH 5 DAYS  Final   Report Status 07/30/2017 FINAL  Final  Urine culture     Status: None   Collection Time: 07/25/17  2:40 PM  Result Value Ref Range Status   Specimen Description URINE, CATHETERIZED  Final   Special Requests NONE  Final   Culture NO GROWTH  Final   Report Status 07/27/2017 FINAL  Final  MRSA PCR Screening     Status: None   Collection Time: 07/25/17  6:11 PM  Result Value Ref Range Status   MRSA by PCR NEGATIVE NEGATIVE Final    Comment:        The GeneXpert MRSA Assay (FDA approved for NASAL specimens only), is one component of a comprehensive MRSA colonization surveillance program. It is not intended to diagnose MRSA infection nor to guide or monitor treatment for MRSA infections.   Culture, blood (routine x 2)     Status: None   Collection Time: 07/25/17  6:34 PM  Result Value Ref Range Status   Specimen Description BLOOD RIGHT HAND  Final    Special Requests IN PEDIATRIC BOTTLE Blood Culture adequate volume  Final   Culture NO GROWTH 5 DAYS  Final   Report Status 07/31/2017 FINAL  Final         Radiology Studies: Dg Chest Port 1 View  Result Date: 07/30/2017 CLINICAL DATA:  Shortness of breath, STEMI, pneumonia. History of [place] OPD, current smoker. EXAM: PORTABLE CHEST 1 VIEW COMPARISON:  Chest x-ray of July 26, 2017 FINDINGS: The lungs remain well-expanded. There is new increased interstitial density at both bases greatest on the right. The hemidiaphragms are partially obscured. There is new increased interstitial density in the left upper lobe as well. The heart is top-normal in size. The pulmonary vascularity is not clearly engorged. There is calcification in the wall of the aortic arch. The pulmonary vascularity is not engorged. IMPRESSION: Interval development of bilateral interstitial and early alveolar infiltrates. Probable small bilateral pleural effusions. No significant pulmonary vascular congestion. Thoracic aortic atherosclerosis. Electronically Signed   By: David  Martinique M.D.   On: 07/30/2017 07:48        Scheduled Meds: . aspirin EC  81 mg Oral Daily  . atorvastatin  40 mg Oral q1800  . chlorhexidine  15 mL Mouth Rinse BID  . clopidogrel  75 mg Oral Daily  . enoxaparin (LOVENOX) injection  30 mg Subcutaneous Q24H  . feeding supplement (PRO-STAT SUGAR FREE 64)  30 mL Per Tube Daily  . folic  acid  1 mg Intravenous Daily  . Influenza vac split quadrivalent PF  0.5 mL Intramuscular Tomorrow-1000  . mouth rinse  15 mL Mouth Rinse BID  . metoprolol tartrate  2.5 mg Intravenous Q6H  . tamsulosin  0.4 mg Oral Daily  . thiamine  100 mg Intravenous Daily  . traZODone  150 mg Oral QHS   Continuous Infusions: . feeding supplement (JEVITY 1.2 CAL) 20 mL (07/31/17 1409)  . lactated ringers 75 mL/hr at 07/31/17 1115     LOS: 6 days    Time spent: 40 minutes    Heru Montz, Geraldo Docker, MD [facility] Pager [phone]   If 7PM-7AM, please contact night-coverage [email or web address] Password [email or web address] 07/31/2017, 5:05 PM

## 2017-07-31 NOTE — Evaluation (Signed)
Occupational Therapy Evaluation Patient Details Name: David Lucero MRN: [id number] DOB: 05-05-1946 Today's Date: 07/31/2017    History of Present Illness This 71 y.o. male admitted after being found on floor of home by a friend.  He was found to by hypoterhmic.  VZ:DGLOV; x-ray/CT showed Lt humerus fx (nonoperative), SIRS/aspiration PNA, rhabdomyolosis, Acute renal failure.  PMH includes:  COPD, PTSD, anxiety, depression, ETOH abuse, lung CA   Clinical Impression   Pt admitted with above. He demonstrates the below listed deficits and will benefit from continued OT to maximize safety and independence with BADLs.  Pt presents to OT with impaired cognition, decreased balance, generalized weakness, decreased activity tolerance.  He currently requires max A for ADLs, and mod A +2 for sit to stand.   Per chart, he lived alone, but situation sounds marginal.  Recommend SNF level rehab at discharge.       Follow Up Recommendations  SNF;Supervision/Assistance - 24 hour    Equipment Recommendations  None recommended by OT    Recommendations for Other Services       Precautions / Restrictions Precautions Precautions: Fall;Other (comment) Precaution Comments: Sling to LT UE due to humeral fx  Restrictions Weight Bearing Restrictions: Yes LUE Weight Bearing: Non weight bearing      Mobility Bed Mobility Overal bed mobility: Needs Assistance Bed Mobility: Supine to Sit;Sit to Supine     Supine to sit: +2 for physical assistance;Total assist Sit to supine: Max assist;+2 for physical assistance   General bed mobility comments: Pt requires assist for all aspects.  He was able to assist minimally with lowering trunk toward bed   Transfers Overall transfer level: Needs assistance   Transfers: Sit to/from Stand Sit to Stand: Mod assist;+2 physical assistance         General transfer comment: Pt requires assist to power up into standing     Balance Overall balance assessment:  Needs assistance Sitting-balance support: Feet supported Sitting balance-Leahy Scale: Poor Sitting balance - Comments: Pt requires min A - close min guard assist for static sitting EOB  Postural control: Posterior lean Standing balance support: Single extremity supported Standing balance-Leahy Scale: Poor Standing balance comment: Pt requires mod A +2 to maintain static standing for ~4 mins before fatiguing                            ADL either performed or assessed with clinical judgement   ADL Overall ADL's : Needs assistance/impaired Eating/Feeding: NPO   Grooming: Wash/dry hands;Wash/dry face;Brushing hair;Oral care;Maximal assistance;Sitting   Upper Body Bathing: Maximal assistance;Bed level   Lower Body Bathing: Total assistance;Bed level;Sit to/from stand   Upper Body Dressing : Total assistance;Bed level   Lower Body Dressing: Total assistance;Bed level;Sit to/from stand   Toilet Transfer: Moderate assistance;+2 for physical assistance;Stand-pivot;BSC   Toileting- Clothing Manipulation and Hygiene: Total assistance;Sit to/from stand       Functional mobility during ADLs: Moderate assistance;+2 for physical assistance General ADL Comments: Pt demonstrates difficulty engaging in ADL activities due to focused attention      Vision         Perception     Praxis      Pertinent Vitals/Pain Pain Assessment: Faces Faces Pain Scale: Hurts even more Pain Location: with movemement - Lt UE, bil. feet/toes, ribs  Pain Descriptors / Indicators: Grimacing;Guarding;Moaning Pain Intervention(s): Monitored during session;Limited activity within patient's tolerance;Repositioned     Hand Dominance Right   Extremity/Trunk Assessment Upper  Extremity Assessment Upper Extremity Assessment: LUE deficits/detail LUE Deficits / Details: Lt UE immobilized with sling    Lower Extremity Assessment Lower Extremity Assessment: Defer to PT evaluation   Cervical / Trunk  Assessment Cervical / Trunk Assessment: Kyphotic   Communication Communication Communication: Expressive difficulties (slow to respond )   Cognition Arousal/Alertness: Awake/alert Behavior During Therapy: WFL for tasks assessed/performed Overall Cognitive Status: Impaired/Different from baseline Area of Impairment: Orientation;Attention;Memory;Following commands;Safety/judgement;Problem solving                 Orientation Level: Disoriented to;Place;Time;Situation Current Attention Level: Focused Memory: Decreased short-term memory;Decreased recall of precautions Following Commands: Follows one step commands inconsistently;Follows one step commands with increased time Safety/Judgement: Decreased awareness of deficits;Decreased awareness of safety   Problem Solving: Slow processing;Decreased initiation;Difficulty sequencing;Requires verbal cues;Requires tactile cues General Comments: Pt intermittently oriented to situation.     General Comments  HR to 114 with activity     Exercises     Shoulder Instructions      Home Living Family/patient expects to be discharged to:: Skilled nursing facility                                 Additional Comments: Per CM notes  Pt lived alone in unkept house.  Neighbors assisted him with meals PTA       Prior Functioning/Environment          Comments: assume pt was independent with basic ADLs and functional mobility, but unsure how  safe he was, nor if he had a fall h/o - multiple scaps, abrasions noted on bil. LEs.  Per CM notes, neighbors report pt's house was unkept, and they provided one meal/day to pt            OT Problem List: Decreased strength;Decreased activity tolerance;Impaired balance (sitting and/or standing);Decreased cognition;Decreased safety awareness;Decreased knowledge of use of DME or AE;Decreased knowledge of precautions;Impaired UE functional use;Pain;Decreased range of motion;Decreased  coordination      OT Treatment/Interventions: Self-care/ADL training;DME and/or AE instruction;Therapeutic exercise;Therapeutic activities;Cognitive remediation/compensation;Patient/family education;Balance training    OT Goals(Current goals can be found in the care plan section) Acute Rehab OT Goals Patient Stated Goal: to go home  OT Goal Formulation: With patient Time For Goal Achievement: 08/14/17 Potential to Achieve Goals: Good ADL Goals Pt Will Perform Grooming: with min assist;standing Pt Will Perform Upper Body Bathing: with min assist;sitting Pt Will Perform Lower Body Bathing: with mod assist;sit to/from stand Pt Will Transfer to Toilet: with min assist;ambulating;regular height toilet;bedside commode;grab bars Pt Will Perform Toileting - Clothing Manipulation and hygiene: with min assist;sit to/from stand Additional ADL Goal #1: Pt will sustain attention to familiar ADL tasks x 5 mins with min cues   OT Frequency: Min 2X/week   Barriers to D/C: Decreased caregiver support          Co-evaluation PT/OT/SLP Co-Evaluation/Treatment: Yes Reason for Co-Treatment: Necessary to address cognition/behavior during functional activity;For patient/therapist safety   OT goals addressed during session: ADL's and self-care;Strengthening/ROM      AM-PAC PT "6 Clicks" Daily Activity     Outcome Measure Help from another person eating meals?: Total Help from another person taking care of personal grooming?: A Lot Help from another person toileting, which includes using toliet, bedpan, or urinal?: Total Help from another person bathing (including washing, rinsing, drying)?: Total Help from another person to put on and taking off regular upper body clothing?: Total Help  from another person to put on and taking off regular lower body clothing?: Total 6 Click Score: 7   End of Session Equipment Utilized During Treatment: Oxygen Nurse Communication: Mobility status  Activity  Tolerance: Patient limited by fatigue Patient left: in bed;with call bell/phone within reach;with bed alarm set;with restraints reapplied  OT Visit Diagnosis: Unsteadiness on feet (R26.81);Cognitive communication deficit (R41.841)                Time: 2952-8413 OT Time Calculation (min): 28 min Charges:  OT General Charges $OT Visit: 1 Visit OT Evaluation $OT Eval Moderate Complexity: 1 Mod G-Codes:     [facility], OTR/L [phone]   Lucille Passy M 07/31/2017, 2:51 PM

## 2017-07-31 NOTE — Plan of Care (Signed)
Problem: Physical Regulation: Goal: Ability to maintain clinical measurements within normal limits will improve Outcome: Progressing VSS. Pt still A/OX1. Some restlessness. No agitation noted. C/o pain in L shoulder/arm. Ice applied. Pt still having product cough. Oral suctioning and oral care PRN throughout shift. Foley maintained. Accurate I/O collected. Pt still not having BM. Passing gas w/ active bowel sounds. Will continue to monitor.

## 2017-08-01 DIAGNOSIS — S42202D Unspecified fracture of upper end of left humerus, subsequent encounter for fracture with routine healing: Secondary | ICD-10-CM

## 2017-08-01 LAB — GLUCOSE, CAPILLARY
GLUCOSE-CAPILLARY: 141 mg/dL — AB (ref 65–99)
Glucose-Capillary: 129 mg/dL — ABNORMAL HIGH (ref 65–99)
Glucose-Capillary: 132 mg/dL — ABNORMAL HIGH (ref 65–99)
Glucose-Capillary: 136 mg/dL — ABNORMAL HIGH (ref 65–99)
Glucose-Capillary: 139 mg/dL — ABNORMAL HIGH (ref 65–99)
Glucose-Capillary: 155 mg/dL — ABNORMAL HIGH (ref 65–99)

## 2017-08-01 LAB — BASIC METABOLIC PANEL
Anion gap: 9 (ref 5–15)
CHLORIDE: 103 mmol/L (ref 101–111)
CO2: 22 mmol/L (ref 22–32)
Calcium: 7.3 mg/dL — ABNORMAL LOW (ref 8.9–10.3)
Creatinine, Ser: 0.47 mg/dL — ABNORMAL LOW (ref 0.61–1.24)
GFR calc Af Amer: >60 mL/min (ref >60)
GFR calc non Af Amer: >60 mL/min (ref >60)
GLUCOSE: 146 mg/dL — AB (ref 65–99)
POTASSIUM: 3.2 mmol/L — AB (ref 3.5–5.1)
Sodium: 134 mmol/L — ABNORMAL LOW (ref 135–145)

## 2017-08-01 LAB — CBC
HEMATOCRIT: 27 % — AB (ref 39.0–52.0)
HEMOGLOBIN: 8.3 g/dL — AB (ref 13.0–17.0)
MCH: 22.9 pg — AB (ref 26.0–34.0)
MCHC: 30.7 g/dL (ref 30.0–36.0)
MCV: 74.6 fL — ABNORMAL LOW (ref 78.0–100.0)
Platelets: 250 10*3/uL (ref 150–400)
RBC: 3.62 MIL/uL — AB (ref 4.22–5.81)
RDW: 19.9 % — ABNORMAL HIGH (ref 11.5–15.5)
WBC: 6.7 10*3/uL (ref 4.0–10.5)

## 2017-08-01 LAB — MAGNESIUM: MAGNESIUM: 1.8 mg/dL (ref 1.7–2.4)

## 2017-08-01 LAB — CK: Total CK: 208 U/L (ref 49–397)

## 2017-08-01 MED ORDER — MAGNESIUM SULFATE 2 GM/50ML IV SOLN
2.0000 g | Freq: Once | INTRAVENOUS | Status: AC
  Administered 2017-08-01: 2 g via INTRAVENOUS
  Filled 2017-08-01: qty 50

## 2017-08-01 MED ORDER — POTASSIUM CHLORIDE 20 MEQ/15ML (10%) PO SOLN
80.0000 meq | Freq: Once | ORAL | Status: AC
  Administered 2017-08-01: 80 meq
  Filled 2017-08-01: qty 60

## 2017-08-01 NOTE — Progress Notes (Signed)
PROGRESS NOTE    David Lucero  ZHY:[id number] DOB: [birth year] DOA: 07/25/2017 PCP: [name], MD   Brief Narrative:  71 year old WM PMHx PTSD,Anxiety,Depression EtOH abuse, Lung cancer, COPD,PAD  EMS was called the a.m. of 10/13 after the patient was found lying on the floor of his home by a friend. The house was cold. He had last been seen normal on 10/12 around noon. On EMS arrival he was noted to have ST elevation with left upper extremity trauma. He was found to be hypothermic and tremulous, he was transferred to [facility] emergency room ER evaluation: In ER complained of left arm pain, denied chest discomfort or shortness of breath. 12-lead showed atrial fibrillation, ST depression in V1 through V3, also concerns of nonspecific intraventricular conduction delay His initial temperature was 87.9 Fahrenheit, he was bradycardic, and had rapid respiratory rate. His initial lactic acid was 16, hemoglobin 8. A chest x-ray demonstrated fracture of the proximal left humerus. His troponin was mildly elevated. He was treated with warmed IV fluids, arm was immobilized, seen by Dr. Ellyn Hack who felt EKG was consistent with inferior MI. He recommended IV heparin, and supportive care for medical issues prior to further cardiac intervention. Given his degree of metabolic disarray critical care was asked to admit.    Subjective: 10/21 A/O 4, follows commands. Negative SOB, negative abdominal pain, negative chest pain, negative N/V. States would like to smoke a cigarette.      Assessment & Plan:   Active Problems:   STEMI (ST elevation myocardial infarction) ([place])   SIRS/Aspiration Pneumonia -patient was found down on floor unknown amount time. Coughing up purulent sputum. He CXR showing bilateral interstitial/alveolar infiltrate:Pneumonia vs pneumonitis? Given patient's normal WBC, negative elevated Favor pneumonitis.  -afebrile last 24 hours.leukocytosis resolved. Respiratory exam  WNL.continued high risk for aspiration.   Acute Inferior STEMI -Although cardiology believes has a high probability of obstructive CAD, NOT a candidate for cardiac catheterization  -Echocardiogram; Limited without significant LV impairment. See results below -strict in and out since admission+9.0 L -Daily weight Filed Weights   07/31/17 0426 08/01/17 0630 08/02/17 0439  Weight: 145 lb 14.4 oz (66.2 kg) 144 lb 13.5 oz (65.7 kg) 147 lb 12.8 oz (67 kg)  -Transfuse for hemoglobin< 8  Paroxysmal Atrial fibrillation  -currently in NSR   Rhabdomyolysis -lactated Ringer's to [facility] 07/26/17 0629 07/27/17 1530 07/28/17 0234 07/30/17 0346 07/31/17 0759 08/01/17 0346  CKTOTAL 5,971* 2,172* 1,107* 484* 334 208  -resolved  Acute renal failure  Recent Labs Lab 07/28/17 0234 07/29/17 0713 07/30/17 0346 07/31/17 0759 08/01/17 0346 08/02/17 0529  CREATININE 0.53* 0.51* 0.49* 0.58* 0.47* 0.45*  -resolved    Acute urinary retention - 10/21 DC Foley place condom cath. -Flomax 0.4 mg daily   Hypokalemia -Potassium goal> 4 -Potassium 60 mEq  Hypomagnesemia -Magnesium goal> 2  Severe protein calorie malnutrition in setting of alcohol abuse/Dysphagia -Advance diet as mental status allows, not presently safe to begin diet due to AMS -10/19 S/P CorTrak placement; Tube feeds per nutrition: tube feeds at goal -[place] attempted to contact brother again today was also unsuccessful. They discussed case in the plan will be to doctors will chart PEG tube required and PEG will be placed next week if patient still does not pass swallow study. In the meantime she will attempt to have neighbor agreed to sign the patient placement to [facility] or SNF -10/23 retry swallow study if patient fails will place PEG.  To doctors will need to sign for medical necessity. No family members available.   Microcytic anemia   Recent Labs Lab 07/28/17 0823 07/29/17 0028 07/30/17 0346  07/31/17 0759 08/01/17 0346 08/02/17 0529  HGB 8.6* 9.3* 8.2* 8.3* 8.3* 7.9*  -stable continue to monitor closely -10/16 transfused 1 unit PRBC  History of laryngeal and lung cancer -records not currently available - reportedly treated 10 years ago      Hyperglycemia -10/16 Hemoglobin A1c= 5.5 not consistent with prediabetes/diabetes   Acute delirium - History of alcohol abuse / EtOH withdrawal -CIWA protocol DC'd.  -neighbor/friend suggested cognitive decline secondary EtOH? vs dementia?  -some improvement today with his cognition however still some serious cognitive deficits.  -out of bed to chair, bedside sitter, remove mittens   Left proximal humerus fracture -patient admitted on 10/13 now outside window for alcohol withdrawal. DC CIWA protocol  Goals of care -10/18 PT/OT consult: Patient with acute STEMI not candidate for invasive procedure. EtOH abuse with EtOH delirium, and rhabdomyolysis evaluate for CIR vs SNF vs LTAC -10/19 attempted to contact brother using phone number and demographics, no answer. Consult to LCSW will need to track down brother prior to placement   DVT prophylaxis: IV heparin Code Status: full Family Communication: one Disposition Plan: TBD   Consultants:  [facility]    Procedures/Significant Events:  10/14 Echocardiogram:-LVEF 60% to 65%. - Aorta: Poorly visualied - calcified leafelts, possible stenosis. - Right ventricle: The cavity size was mildly dilated. Mild   hypokinesis. 10/16 transfused 1 unit PRBC 10/19 CorTrak tube placed   I have personally reviewed and interpreted all radiology studies and my findings are as above.  VENTILATOR SETTINGS:    Cultures   Antimicrobials: Anti-infectives    Start     Stop   07/26/17 1000  vancomycin (VANCOCIN) IVPB 750 mg/150 ml premix  Status:  Discontinued     07/27/17 1147   07/25/17 2300  piperacillin-tazobactam (ZOSYN) IVPB 3.375 g  Status:  Discontinued      07/27/17 1147   07/25/17 1630  vancomycin (VANCOCIN) IVPB 1000 mg/200 mL premix  Status:  Discontinued     07/26/17 0955   07/25/17 1630  piperacillin-tazobactam (ZOSYN) IVPB 3.375 g     07/25/17 1708       Devices   LINES / TUBES:  CorTrak tube 10/19>>>    Continuous Infusions: . feeding supplement (JEVITY 1.2 CAL) 1,000 mL (08/01/17 0347)  . lactated ringers 75 mL/hr at 08/01/17 0016     Objective: Vitals:   08/01/17 0331 08/01/17 0414 08/01/17 0630 08/01/17 0744  BP:  (!) 160/65  (!) 162/70  Pulse: 88 87  87  Resp:  20  (!) 22  Temp:  99.3 F (37.4 C)    TempSrc:  Axillary    SpO2:  93%  94%  Weight:   144 lb 13.5 oz (65.7 kg)   Height:        Intake/Output Summary (Last 24 hours) at 08/01/17 1047 Last data filed at 08/01/17 0900  Gross per 24 hour  Intake          3390.25 ml  Output             3200 ml  Net           190.25 ml   Filed Weights   07/30/17 0414 07/31/17 0426 08/01/17 0630  Weight: 144 lb 8 oz (65.5 kg) 145 lb 14.4 oz (66.2 kg) 144 lb 13.5 oz (65.7 kg)  Physical Exam:  General: A/O 4, cooperative, No acute respiratory distress ENT: Negative Runny nose, negative gingival bleeding,CorTrack tube present Lungs: Clear to auscultation bilaterally without wheezes or crackles Cardiovascular: Regular rate and rhythm without murmur gallop or rub normal S1 and S2 Abdomen: negative abdominal pain, nondistended, positive soft, bowel sounds, no rebound, no ascites, no appreciable mass Extremities: No significant cyanosis, clubbing, or edema bilateral lower extremities Skin: Negative rashes, lesions, ulcers Psychiatric:  Unable to assess secondary to altered mental status  Central nervous system:  Cranial nerves II through XII intact, tongue/uvula midline, all extremities muscle strength 5/5(LUE movement limited by fracture), sensation intact throughout, positive dysarthria, negative expressive aphasia, negative receptive aphasia.      Data  Reviewed: Care during the described time interval was provided by me .  I have reviewed this patient's available data, including medical history, events of note, physical examination, and all test results as part of my evaluation.   CBC:  Recent Labs Lab 07/25/17 1215  07/28/17 0234 07/28/17 5176 07/29/17 0028 07/30/17 0346 07/31/17 0759 08/01/17 0346  WBC 18.8*  < > 7.1  --  8.4 7.7 8.9 6.7  NEUTROABS 15.8*  --   --   --   --   --   --   --   HGB 8.4*  < > 6.7* 8.6* 9.3* 8.2* 8.3* 8.3*  HCT 28.6*  < > 22.6* 28.1* 29.0* 27.2* 27.1* 27.0*  MCV 76.1*  < > 73.9*  --  74.7* 75.1* 75.3* 74.6*  PLT 324  < > 239  --  239 231 272 250  < > = values in this interval not displayed. Basic Metabolic Panel:  Recent Labs Lab 07/26/17 1031  07/28/17 0234 07/28/17 1539 07/29/17 0713 07/30/17 0346 07/31/17 0759 08/01/17 0346  NA  --   < > 138  --  137 137 135 134*  K  --   < > 2.7* 4.0 3.4* 3.2* 3.3* 3.2*  CL  --   < > 105  --  104 103 103 103  CO2  --   < > 24  --  24 22 18* 22  GLUCOSE  --   < > 97  --  85 77 84 146*  BUN  --   < > 5*  --  5* <5* <5* <5*  CREATININE  --   < > 0.53*  --  0.51* 0.49* 0.58* 0.47*  CALCIUM  --   < > 7.4*  --  7.8* 7.5* 7.5* 7.3*  MG 1.8  --   --   --  1.6* 1.6* 1.7 1.8  < > = values in this interval not displayed. GFR: Estimated Creatinine Clearance: 77.6 mL/min (A) (by C-G formula based on SCr of 0.47 mg/dL (L)). Liver Function Tests:  Recent Labs Lab 07/25/17 1215 07/25/17 1640 07/28/17 0234  AST 95* 110* 81*  ALT 23 22 30   ALKPHOS 123 104 112  BILITOT 0.9 0.8 0.7  PROT 5.8* 5.2* 4.6*  ALBUMIN 2.7* 2.6* 2.0*   No results for input(s): LIPASE, AMYLASE in the last 168 hours. No results for input(s): AMMONIA in the last 168 hours. Coagulation Profile:  Recent Labs Lab 07/25/17 1215  INR 1.29   Cardiac Enzymes:  Recent Labs Lab 07/27/17 1530 07/28/17 0234 07/30/17 0346 07/31/17 0759 08/01/17 0346  CKTOTAL 2,172* 1,107* 484* 334  208  CKMB 12.4*  --   --   --   --   TROPONINI 6.47*  --   --   --   --  BNP (last 3 results) No results for input(s): PROBNP in the last 8760 hours. HbA1C: No results for input(s): HGBA1C in the last 72 hours. CBG:  Recent Labs Lab 07/31/17 1632 07/31/17 2030 08/01/17 0015 08/01/17 0408 08/01/17 0738  GLUCAP 123* 141* 139* 141* 155*   Lipid Profile: No results for input(s): CHOL, HDL, LDLCALC, TRIG, CHOLHDL, LDLDIRECT in the last 72 hours. Thyroid Function Tests: No results for input(s): TSH, T4TOTAL, FREET4, T3FREE, THYROIDAB in the last 72 hours. Anemia Panel: No results for input(s): VITAMINB12, FOLATE, FERRITIN, TIBC, IRON, RETICCTPCT in the last 72 hours. Urine analysis:    Component Value Date/Time   COLORURINE YELLOW 07/25/2017 1440   APPEARANCEUR CLEAR 07/25/2017 1440   LABSPEC 1.010 07/25/2017 1440   PHURINE 6.0 07/25/2017 1440   GLUCOSEU NEGATIVE 07/25/2017 1440   HGBUR LARGE (A) 07/25/2017 1440   BILIRUBINUR NEGATIVE 07/25/2017 1440   KETONESUR 20 (A) 07/25/2017 1440   PROTEINUR 30 (A) 07/25/2017 1440   UROBILINOGEN 0.2 04/30/2014 1350   NITRITE NEGATIVE 07/25/2017 1440   LEUKOCYTESUR NEGATIVE 07/25/2017 1440   Sepsis Labs: @LABRCNTIP (procalcitonin:4,lacticidven:4)  ) Recent Results (from the past 240 hour(s))  Culture, blood (routine x 2)     Status: None   Collection Time: 07/25/17 12:15 PM  Result Value Ref Range Status   Specimen Description BLOOD RIGHT ANTECUBITAL  Final   Special Requests   Final    BOTTLES DRAWN AEROBIC AND ANAEROBIC Blood Culture adequate volume   Culture NO GROWTH 5 DAYS  Final   Report Status 07/30/2017 FINAL  Final  Urine culture     Status: None   Collection Time: 07/25/17  2:40 PM  Result Value Ref Range Status   Specimen Description URINE, CATHETERIZED  Final   Special Requests NONE  Final   Culture NO GROWTH  Final   Report Status 07/27/2017 FINAL  Final  MRSA PCR Screening     Status: None   Collection Time:  07/25/17  6:11 PM  Result Value Ref Range Status   MRSA by PCR NEGATIVE NEGATIVE Final    Comment:        The GeneXpert MRSA Assay (FDA approved for NASAL specimens only), is one component of a comprehensive MRSA colonization surveillance program. It is not intended to diagnose MRSA infection nor to guide or monitor treatment for MRSA infections.   Culture, blood (routine x 2)     Status: None   Collection Time: 07/25/17  6:34 PM  Result Value Ref Range Status   Specimen Description BLOOD RIGHT HAND  Final   Special Requests IN PEDIATRIC BOTTLE Blood Culture adequate volume  Final   Culture NO GROWTH 5 DAYS  Final   Report Status 07/31/2017 FINAL  Final         Radiology Studies: No results found.      Scheduled Meds: . aspirin EC  81 mg Oral Daily  . atorvastatin  40 mg Oral q1800  . chlorhexidine  15 mL Mouth Rinse BID  . clopidogrel  75 mg Oral Daily  . enoxaparin (LOVENOX) injection  30 mg Subcutaneous Q24H  . feeding supplement (PRO-STAT SUGAR FREE 64)  30 mL Per Tube Daily  . folic acid  1 mg Intravenous Daily  . Influenza vac split quadrivalent PF  0.5 mL Intramuscular Tomorrow-1000  . mouth rinse  15 mL Mouth Rinse BID  . metoprolol tartrate  2.5 mg Intravenous Q6H  . tamsulosin  0.4 mg Oral Daily  . thiamine  100 mg Intravenous Daily  .  traZODone  150 mg Oral QHS   Continuous Infusions: . feeding supplement (JEVITY 1.2 CAL) 1,000 mL (08/01/17 0347)  . lactated ringers 75 mL/hr at 08/01/17 0016     LOS: 7 days    Time spent: 40 minutes    Billyjoe Go, Geraldo Docker, MD [facility] Pager [phone]   If 7PM-7AM, please contact night-coverage [email or web address] Password [email or web address] 08/01/2017, 10:47 AM

## 2017-08-01 NOTE — Progress Notes (Signed)
CSW used Primary school teacher to try and locate patient's brother. None of the phone numbers worked or were connected to patient. Please communicate with patient's friend [name] [phone]) to see if he would be willing to help make medical decisions.   Percell Locus Mylene Bow LCSWA [phone]

## 2017-08-01 NOTE — Progress Notes (Signed)
  Speech Language Pathology Treatment: Dysphagia  Patient Details Name: David Lucero MRN: [id number] DOB: [birth year] Today's Date: 08/01/2017 Time: 4128-7867 SLP Time Calculation (min) (ACUTE ONLY): 8 min  Assessment / Plan / Recommendation Clinical Impression  Patient seen for follow-up for dysphagia to assess readiness for instrumental testing (MBS). Pt now with cortrak; he remains confused though he does respond appropriately to basic, contextual questions. Voice is extremely wet; pt is unable to follow cues for cough/throat clear despite max cues. Oral care provided prior to diagnostic ice chip trial. Pt with minimal oral manipulation, no observed pharyngeal swallow to palpation, with immediate weak, wet cough, suggestive of reduced airway protection. SLP again attempted to cue pt for strong cough, but cognitively he is unable to follow these commands. After a few moments, pt elicited a strong reflexive cough and was able to expectorate secretions orally, which SLP suctioned. Afterwards, vocal quality notably improved. Recommend he remain NPO with cortrak; will continue to follow for instrumental readiness pending improvements in secretion management, mental status.    HPI HPI: 71 year old male patient with multiple comorbidities including: anxiety, alcohol abuse,GERD, COPD, PTSD, PAD, depression, lung cancer, laryngeal cancer, chronic obstructive pulmonary disease. Pt found down by a friend. CXR no acute cardiopulmonary abnormality seen. Imaging of humerus revealed moderately displaced and comminuted proximal left humeral head and neck fracture is noted. Found to have acute inferior ST elevation myocardial infarction, severe lactic acidosis, A-fib, hypokalemia, severe malnutrition. RN reported pt pocketing food.       SLP Plan  Continue with current plan of care;Other (Comment) (defer MBS at this time)       Recommendations  Diet recommendations: NPO Medication Administration: Via  alternative means                Oral Care Recommendations: Oral care QID Follow up Recommendations: Skilled Nursing facility SLP Visit Diagnosis: Dysphagia, oropharyngeal phase (R13.12) Plan: Continue with current plan of care;Other (Comment) (defer MBS at this time)       [place], [place], [place] Pathologist [phone] Aliene Altes 08/01/2017, 9:09 AM

## 2017-08-02 LAB — BASIC METABOLIC PANEL
Anion gap: 8 (ref 5–15)
BUN: 6 mg/dL (ref 6–20)
CHLORIDE: 105 mmol/L (ref 101–111)
CO2: 21 mmol/L — ABNORMAL LOW (ref 22–32)
Calcium: 7.1 mg/dL — ABNORMAL LOW (ref 8.9–10.3)
Creatinine, Ser: 0.45 mg/dL — ABNORMAL LOW (ref 0.61–1.24)
GFR calc non Af Amer: >60 mL/min (ref >60)
Glucose, Bld: 136 mg/dL — ABNORMAL HIGH (ref 65–99)
POTASSIUM: 3.5 mmol/L (ref 3.5–5.1)
SODIUM: 134 mmol/L — AB (ref 135–145)

## 2017-08-02 LAB — GLUCOSE, CAPILLARY
GLUCOSE-CAPILLARY: 135 mg/dL — AB (ref 65–99)
Glucose-Capillary: 122 mg/dL — ABNORMAL HIGH (ref 65–99)
Glucose-Capillary: 137 mg/dL — ABNORMAL HIGH (ref 65–99)
Glucose-Capillary: 138 mg/dL — ABNORMAL HIGH (ref 65–99)
Glucose-Capillary: 136 mg/dL — ABNORMAL HIGH (ref 65–99)

## 2017-08-02 LAB — CBC
HCT: 26 % — ABNORMAL LOW (ref 39.0–52.0)
Hemoglobin: 7.9 g/dL — ABNORMAL LOW (ref 13.0–17.0)
MCH: 23 pg — ABNORMAL LOW (ref 26.0–34.0)
MCHC: 30.4 g/dL (ref 30.0–36.0)
MCV: 75.8 fL — ABNORMAL LOW (ref 78.0–100.0)
Platelets: 230 10*3/uL (ref 150–400)
RBC: 3.43 MIL/uL — ABNORMAL LOW (ref 4.22–5.81)
RDW: 21.2 % — ABNORMAL HIGH (ref 11.5–15.5)
WBC: 7.8 10*3/uL (ref 4.0–10.5)

## 2017-08-02 LAB — MAGNESIUM: Magnesium: 2.1 mg/dL (ref 1.7–2.4)

## 2017-08-02 LAB — CK: Total CK: 152 U/L (ref 49–397)

## 2017-08-02 MED ORDER — POTASSIUM CHLORIDE 20 MEQ/15ML (10%) PO SOLN
60.0000 meq | Freq: Once | ORAL | Status: AC
  Administered 2017-08-02: 60 meq
  Filled 2017-08-02: qty 45

## 2017-08-02 NOTE — Progress Notes (Signed)
Pt has order to have a Air cabin crew. Per  Camera operator, there is no sitter available at this time and pt need to be "sitter free" for 24 h prior to discahrge top SNF. MD was notified.  Ferdinand Lango, RN

## 2017-08-03 ENCOUNTER — Inpatient Hospital Stay (HOSPITAL_COMMUNITY): Payer: Medicare Other

## 2017-08-03 DIAGNOSIS — J690 Pneumonitis due to inhalation of food and vomit: Secondary | ICD-10-CM

## 2017-08-03 LAB — BASIC METABOLIC PANEL
ANION GAP: 7 (ref 5–15)
BUN: 10 mg/dL (ref 6–20)
CHLORIDE: 105 mmol/L (ref 101–111)
CO2: 21 mmol/L — ABNORMAL LOW (ref 22–32)
Calcium: 7.2 mg/dL — ABNORMAL LOW (ref 8.9–10.3)
Creatinine, Ser: 0.63 mg/dL (ref 0.61–1.24)
GFR calc Af Amer: >60 mL/min (ref >60)
GLUCOSE: 133 mg/dL — AB (ref 65–99)
POTASSIUM: 3.5 mmol/L (ref 3.5–5.1)
SODIUM: 133 mmol/L — AB (ref 135–145)

## 2017-08-03 LAB — CBC
HEMATOCRIT: 26.6 % — AB (ref 39.0–52.0)
HEMOGLOBIN: 8.1 g/dL — AB (ref 13.0–17.0)
MCH: 23.5 pg — ABNORMAL LOW (ref 26.0–34.0)
MCHC: 30.5 g/dL (ref 30.0–36.0)
MCV: 77.1 fL — ABNORMAL LOW (ref 78.0–100.0)
Platelets: 200 10*3/uL (ref 150–400)
RBC: 3.45 MIL/uL — AB (ref 4.22–5.81)
RDW: 21.2 % — ABNORMAL HIGH (ref 11.5–15.5)
WBC: 18.8 10*3/uL — ABNORMAL HIGH (ref 4.0–10.5)

## 2017-08-03 LAB — BLOOD GAS, ARTERIAL
ACID-BASE EXCESS: 1.2 mmol/L (ref 0.0–2.0)
BICARBONATE: 24.1 mmol/L (ref 20.0–28.0)
Drawn by: 270211
FIO2: 21
O2 SAT: 93.9 %
PATIENT TEMPERATURE: 98.6
PCO2 ART: 31 mmHg — AB (ref 32.0–48.0)
PO2 ART: 65.2 mmHg — AB (ref 83.0–108.0)
pH, Arterial: 7.502 — ABNORMAL HIGH (ref 7.350–7.450)

## 2017-08-03 LAB — MAGNESIUM: MAGNESIUM: 1.8 mg/dL (ref 1.7–2.4)

## 2017-08-03 LAB — GLUCOSE, CAPILLARY
GLUCOSE-CAPILLARY: 116 mg/dL — AB (ref 65–99)
GLUCOSE-CAPILLARY: 133 mg/dL — AB (ref 65–99)
GLUCOSE-CAPILLARY: 182 mg/dL — AB (ref 65–99)
Glucose-Capillary: 146 mg/dL — ABNORMAL HIGH (ref 65–99)
Glucose-Capillary: 120 mg/dL — ABNORMAL HIGH (ref 65–99)

## 2017-08-03 LAB — CK: CK TOTAL: 100 U/L (ref 49–397)

## 2017-08-03 MED ORDER — ATORVASTATIN CALCIUM 40 MG PO TABS
40.0000 mg | ORAL_TABLET | Freq: Every day | ORAL | Status: DC
  Administered 2017-08-03 – 2017-08-09 (×6): 40 mg
  Filled 2017-08-03 (×6): qty 1

## 2017-08-03 MED ORDER — ASPIRIN 81 MG PO CHEW
81.0000 mg | CHEWABLE_TABLET | Freq: Every day | ORAL | Status: DC
  Administered 2017-08-04 – 2017-08-10 (×7): 81 mg
  Filled 2017-08-03 (×7): qty 1

## 2017-08-03 MED ORDER — ACETAMINOPHEN 160 MG/5ML PO SOLN
650.0000 mg | Freq: Four times a day (QID) | ORAL | Status: DC | PRN
  Administered 2017-08-04 – 2017-08-09 (×4): 650 mg
  Filled 2017-08-03 (×4): qty 20.3

## 2017-08-03 MED ORDER — METOPROLOL TARTRATE 25 MG/10 ML ORAL SUSPENSION
12.5000 mg | Freq: Two times a day (BID) | ORAL | Status: DC
  Administered 2017-08-03 – 2017-08-10 (×14): 12.5 mg
  Filled 2017-08-03 (×16): qty 5

## 2017-08-03 MED ORDER — ACETAMINOPHEN 650 MG RE SUPP
325.0000 mg | RECTAL | Status: DC | PRN
  Administered 2017-08-03 – 2017-08-06 (×2): 325 mg via RECTAL
  Filled 2017-08-03 (×2): qty 1

## 2017-08-03 MED ORDER — CLOPIDOGREL BISULFATE 75 MG PO TABS
75.0000 mg | ORAL_TABLET | Freq: Every day | ORAL | Status: DC
  Administered 2017-08-04 – 2017-08-10 (×7): 75 mg
  Filled 2017-08-03 (×7): qty 1

## 2017-08-03 MED ORDER — POTASSIUM CHLORIDE 20 MEQ/15ML (10%) PO SOLN
40.0000 meq | Freq: Once | ORAL | Status: AC
  Administered 2017-08-03: 40 meq
  Filled 2017-08-03: qty 30

## 2017-08-03 NOTE — Progress Notes (Signed)
Pt due to void post foley cath removal. No urine noted in condom cath. Bladder scan showed approx 460mL in bladder. Order obtained for I&O cath x1. Collected 544mL  Will continue to monitor.  Jacqlyn Larsen, RN

## 2017-08-03 NOTE — Progress Notes (Signed)
Pt now having thick, brown secretions being coughed up. :ungs sounds rhonchi and o2 sats in the high 80's on 3L. Oral suctioning and mouth care performed/ Humdified O2 added and oxygen increased to 4.5L. Pt in no distress, tolerated suctioning well. Will continue to monitor.  Jacqlyn Larsen, RN

## 2017-08-03 NOTE — NC FL2 (Signed)
[place] MEDICAID FL2 LEVEL OF CARE SCREENING TOOL     IDENTIFICATION  Patient Name: David Lucero Birthdate: [birth year] Sex: male Admission Date (Current Location): 07/25/2017  [facility] and [place] Number:  Herbalist and Address:  [facility] [place] [address], [place], [place] [address]      Provider Number: [id number]  Attending Physician Name and Address:  Cherene Altes, MD  Relative Name and Phone Number:  [name] friend, [phone]    Current Level of Care: Hospital Recommended Level of Care: [place] Prior Approval Number:    Date Approved/Denied:   PASRR Number: [id number]  Discharge Plan: SNF    Current Diagnoses: Patient Active Problem List   Diagnosis Date Noted  . STEMI (ST elevation myocardial infarction) ([place]) 07/25/2017  . PAD (peripheral artery disease) ([place]) 02/14/2014  . Chest pain 11/03/2013  . UNSPECIFIED HYPOTHYROIDISM 09/24/2008  . MORBID OBESITY 09/24/2008  . Alcohol abuse 09/24/2008  . PTSD 09/24/2008  . COPD 09/24/2008    Orientation RESPIRATION BLADDER Height & Weight        O2 (nasal cannula 4.5 L) Incontinent, External catheter Weight: 143 lb 4.8 oz (65 kg) Height:  5\' 7"  (170.2 cm)  BEHAVIORAL SYMPTOMS/MOOD NEUROLOGICAL BOWEL NUTRITION STATUS      Incontinent Diet, Feeding tube (please see DC summary)  AMBULATORY STATUS COMMUNICATION OF NEEDS Skin   Extensive Assist Verbally Normal                       Personal Care Assistance Level of Assistance  Bathing, Feeding, Dressing Bathing Assistance: Maximum assistance Feeding assistance: Maximum assistance Dressing Assistance: Maximum assistance     Functional Limitations Info             SPECIAL CARE FACTORS FREQUENCY  PT (By licensed PT), OT (By licensed OT)     PT Frequency: 5x/week OT Frequency: 5x/week            Contractures Contractures Info: Not present    Additional Factors Info  Code  Status, Allergies Code Status Info: Full Allergies Info: No Known Allergies           Current Medications (08/03/2017):  This is the current hospital active medication list Current Facility-Administered Medications  Medication Dose Route Frequency Provider Last Rate Last Dose  . acetaminophen (TYLENOL) solution 650 mg  650 mg Per Tube Q6H PRN Cherene Altes, MD      . acetaminophen (TYLENOL) suppository 325 mg  325 mg Rectal Q4H PRN Cherene Altes, MD   325 mg at 08/03/17 1417  . aspirin chewable tablet 81 mg  81 mg Per Tube Daily Joette Catching T, MD      . atorvastatin (LIPITOR) tablet 40 mg  40 mg Per Tube q1800 Cherene Altes, MD      . chlorhexidine (PERIDEX) 0.12 % solution 15 mL  15 mL Mouth Rinse BID Cherene Altes, MD   15 mL at 08/03/17 0858  . [START ON 08/04/2017] clopidogrel (PLAVIX) tablet 75 mg  75 mg Per Tube Daily Joette Catching T, MD      . enoxaparin (LOVENOX) injection 30 mg  30 mg Subcutaneous Q24H Sherren Mocha, MD   30 mg at 08/03/17 0831  . haloperidol lactate (HALDOL) injection 0.5-1 mg  0.5-1 mg Intravenous Q6H PRN Allie Bossier, MD   1 mg at 08/02/17 2322  . MEDLINE mouth rinse  15 mL Mouth Rinse BID Mannam, Praveen,  MD   15 mL at 08/03/17 0858  . metoprolol tartrate (LOPRESSOR) 25 mg/10 mL oral suspension 12.5 mg  12.5 mg Per Tube BID Joette Catching T, MD      . morphine 2 MG/ML injection 2 mg  2 mg Intravenous Q1H PRN Allie Bossier, MD   2 mg at 08/02/17 2107  . tamsulosin (FLOMAX) capsule 0.4 mg  0.4 mg Oral Daily Allie Bossier, MD   0.4 mg at 08/03/17 7034     Discharge Medications: Please see discharge summary for a list of discharge medications.  Relevant Imaging Results:  Relevant Lab Results:   Additional Information SSN: [id number]  Estanislado Emms, LCSW

## 2017-08-03 NOTE — Progress Notes (Signed)
  Speech Language Pathology Treatment: Dysphagia  Patient Details Name: David Lucero MRN: [id number] DOB: 1946-07-09 Today's Date: 08/03/2017 Time: 2924-4628 SLP Time Calculation (min) (ACUTE ONLY): 13 min  Assessment / Plan / Recommendation Clinical Impression  Pt is more lethargic this afternoon than during this SLP's last visit. He has audible wetness at baseline and needs Max cues to elicit coughing. His cough eventually is productive of moderate amounts of dark colored secretions, which SLP orally suctioned. SLP also provided oral care with small amounts of oral movement to show awareness. SLP tried to offer small amounts of water via swab, but otherwise POs were held due to high risk of aspiration given mentation, reduced ability to cough/swallow to command, and no obvious attempts to swallow observed throughout session. He would not be appropriate for MBS as he is not attempting to swallow. Will continue to follow, but recommend that he remain NPO.   HPI HPI: 71 year old male patient with multiple comorbidities including: anxiety, alcohol abuse,GERD, COPD, PTSD, PAD, depression, lung cancer, laryngeal cancer, chronic obstructive pulmonary disease. Pt found down by a friend. CXR no acute cardiopulmonary abnormality seen. Imaging of humerus revealed moderately displaced and comminuted proximal left humeral head and neck fracture is noted. Found to have acute inferior ST elevation myocardial infarction, severe lactic acidosis, A-fib, hypokalemia, severe malnutrition. RN reported pt pocketing food.       SLP Plan  Continue with current plan of care       Recommendations  Diet recommendations: NPO Medication Administration: Via alternative means                Oral Care Recommendations: Oral care QID Follow up Recommendations: Skilled Nursing facility SLP Visit Diagnosis: Dysphagia, oropharyngeal phase (R13.12) Plan: Continue with current plan of care       GO                 Germain Osgood 08/03/2017, 2:56 PM  Germain Osgood, M.A. CCC-SLP [phone]

## 2017-08-03 NOTE — Care Management Important Message (Signed)
Important Message  Patient Details  Name: David Lucero MRN: [id number] Date of Birth: November 06, 1945   Medicare Important Message Given:  Yes    Nathen May 08/03/2017, 11:39 AM

## 2017-08-03 NOTE — Clinical Social Work Note (Signed)
Clinical Social Work Assessment  Patient Details  Name: David Lucero MRN: [id number] Date of Birth: January 29, 1946  Date of referral:  08/03/17               Reason for consult:  Facility Placement                Permission sought to share information with:  Facility Sport and exercise psychologist, Family Supports Permission granted to share information::  No (patient disoriented)  Name::     [name]  Agency::     Relationship::  friend  Contact Information:  [phone]  Housing/Transportation Living arrangements for the past 2 months:  Single Family Home Source of Information:  Other (Comment Required) (friend) Patient Interpreter Needed:  None Criminal Activity/Legal Involvement Pertinent to Current Situation/Hospitalization:  No - Comment as needed Significant Relationships:  Friend Lives with:  Self Do you feel safe going back to the place where you live?    Need for family participation in patient care:  Yes (Comment)  Care giving concerns: Patient from home alone. Found down at home. Son estranged, and no other family indicated. Weekend CSW looked up patient on [employer] and unable to find working number connected to patient. Olena Leatherwood willing to help with medical decisions.   Social Worker assessment / plan: CSW called patient's friend [name] and discussed disposition planning. [name] willing to help make medical decisions and with great concern about patient's condition. Note plan to do swallow test 10/23. CSW to meet with patient and [name] at bedside 10/23 and continue to discuss disposition planning. CSW to follow.  Employment status:  Retired Forensic scientist:  [employer] PT Recommendations:  [place] / Referral to community resources:  [place]  Patient/Family's Response to care: Friend appreciative of care.  Patient/Family's Understanding of and Emotional Response to Diagnosis, Current Treatment, and Prognosis: Friend with  several questions about patient's condition and with great concern for patient's well being. Friend hopeful for positive prognosis, and hoping to talk to MD.  Emotional Assessment Appearance:  Appears stated age Attitude/Demeanor/Rapport:  Unable to Assess Affect (typically observed):  Unable to Assess Orientation:    Alcohol / Substance use:  Not Applicable Psych involvement (Current and /or in the community):  No (Comment)  Discharge Needs  Concerns to be addressed:  Discharge Planning Concerns Readmission within the last 30 days:  No Current discharge risk:  Physical Impairment, Cognitively Impaired Barriers to Discharge:  Continued Medical Work up   Estanislado Emms, LCSW 08/03/2017, 4:17 PM

## 2017-08-03 NOTE — Progress Notes (Signed)
TEAM 1 - Stepdown/ICU TEAM  RASTUS BORTON  OVZ:[id number] DOB: 1946/09/12 DOA: 07/25/2017 PCP: [name], MD    Brief Narrative:  71yo M with hx of anxiety, alcohol abuse, depression, lung cancer, and COPD for whom EMS was called the a.m. of 10/13 after the patient was found lying on the floor of his cold home by a friend. He had last been seen normal on 10/12 around noon. EMS noted ST elevation with left upper extremity trauma. He was found to be hypothermic and tremulous.  In the ED he complained of left arm pain, denied chest discomfort or shortness of breath. 12-lead showed atrial fibrillation, ST depression in V1 through V3, also concerns of nonspecific intraventricular conduction delay.  His initial temperature was 87.39F, he was bradycardic, and had rapid respiratory rate. His initial lactic acid was 16, hemoglobin 8. A chest x-ray demonstrated fracture of the proximal left humerus. His troponin was mildly elevated.  He was treated with warmed IV fluids, arm was immobilized, seen by Dr. Ellyn Hack who felt EKG was consistent with inferior MI. He recommended IV heparin, and supportive care for medical issues prior to further cardiac intervention. Given his degree of metabolic disarray critical care was asked to admit.  Significant Events: 10/13 - found down - admit to [facility] 10/14 TTE - EF 60-65% - poor quality - ?AoS   Subjective: The pt has been much less alert today, and has not participated in therapy.  He is requiring a higher level of O2 support, and has been coughing up thick brown secretions all morning. He is unable to provide a ROS due to lack of response to my questions.     Assessment & Plan:  Acute hypoxic resp failure  ?aspiration of NG feeding - check CXR - increase O2 support as needed (currently requiring 5L [place]) - check ABG given lethargy - stop tube feeds   Acute inferior ST elevation myocardial infarction marked inferior ST elevation improved with  hydration and warming - Cardiology has now signed off - medical management only - TTE w/o evidence of signif LV impairment   Parox Afib Back in NSR  Rhabdomyolysis  Resolved w/ volume resuscitation and time   Bradycardia Resolved   Severe lactic acidosis resolved   Acute kidney injury renal function has normalized w/ volume resuscitation   Acute urinary retention Unable to void post foley removal again - replaced foley again - flomax was started this hospital stay - will add urecholine for the next attempt   Hypokalemia likley has signif total body deficit due to poor nutrition - cont to supplement to goal of 4.0  SIRS - no current clear source of infection SIRS physiology resolved   Severe malnutrition in setting of alcohol abuse S/p cortrak 10/19 - for SLP re-eval 10/23 - if fails will have to consider if PEG is appropriate of if shift to comfort care would be most appropriate    Microcytic anemia Fe studies c/w severe malnutrition associated anemia - Hgb holding steady for now   History of laryngeal and lung cancer records not currently available - reportedly treated 10 years ago     Hyperglycemia A1c not c/w DM   Acute delirium - History of alcohol abuse / EtOH withdrawal CIWA protocol has been completed - hx from neighbors/friends suggest a baseline of cognitive decline, perhaps due to EtOH, or possibly dementia of other cause   Left proximal humerus fracture Keep immobilized - Ortho reports wound is amenable to non-operative  tx - non-weightbearing w/ continued use of sling   Goals of Care Pt has no family available to speak on his behalf - given his overall very poor state I do not feel that a code or use of mechanical ventilation would be appropriate - will attempt to obtain second MD opinion to declare him NCB/DNR  DVT prophylaxis: lovenox   Code Status: FULL CODE Family Communication: no family present at time of exam  Disposition Plan:  SDU  Consultants:  PCCM Orthopedics Cardiology   Antimicrobials:  Zosyn 10/13 > 10/15 Vanc 10/13 > 10/15  Objective: Blood pressure (!) 113/51, pulse 98, temperature (!) 101 F (38.3 C), temperature source Axillary, resp. rate (!) 25, height 5\' 7"  (1.702 m), weight 65 kg (143 lb 4.8 oz), SpO2 90 %.  Intake/Output Summary (Last 24 hours) at 08/03/17 1303 Last data filed at 08/03/17 1100  Gross per 24 hour  Intake              780 ml  Output             1550 ml  Net             -770 ml   Filed Weights   08/01/17 0630 08/02/17 0439 08/03/17 0349  Weight: 65.7 kg (144 lb 13.5 oz) 67 kg (147 lb 12.8 oz) 65 kg (143 lb 4.8 oz)    Examination: General: No acute respiratory distress but requiring 5L  - sedate  Lungs: fine basilar crackles B   Cardiovascular: RRR - no M or rub  Abdomen: NT/ND, soft, bowel sounds positive, no rebound Extremities: No edema B LE   CBC:  Recent Labs Lab 07/30/17 0346 07/31/17 0759 08/01/17 0346 08/02/17 0529 08/03/17 0358  WBC 7.7 8.9 6.7 7.8 18.8*  HGB 8.2* 8.3* 8.3* 7.9* 8.1*  HCT 27.2* 27.1* 27.0* 26.0* 26.6*  MCV 75.1* 75.3* 74.6* 75.8* 77.1*  PLT 231 272 250 230 779   Basic Metabolic Panel:  Recent Labs Lab 07/30/17 0346 07/31/17 0759 08/01/17 0346 08/02/17 0529 08/03/17 0358  NA 137 135 134* 134* 133*  K 3.2* 3.3* 3.2* 3.5 3.5  CL 103 103 103 105 105  CO2 22 18* 22 21* 21*  GLUCOSE 77 84 146* 136* 133*  BUN <5* <5* <5* 6 10  CREATININE 0.49* 0.58* 0.47* 0.45* 0.63  CALCIUM 7.5* 7.5* 7.3* 7.1* 7.2*  MG 1.6* 1.7 1.8 2.1 1.8   GFR: Estimated Creatinine Clearance: 76.7 mL/min (by C-G formula based on SCr of 0.63 mg/dL).  Liver Function Tests:  Recent Labs Lab 07/28/17 0234  AST 81*  ALT 30  ALKPHOS 112  BILITOT 0.7  PROT 4.6*  ALBUMIN 2.0*   Cardiac Enzymes:  Recent Labs Lab 07/27/17 1530  07/30/17 0346 07/31/17 0759 08/01/17 0346 08/02/17 0529 08/03/17 0358  CKTOTAL 2,172*  < > 484* 334 208 152 100   CKMB 12.4*  --   --   --   --   --   --   TROPONINI 6.47*  --   --   --   --   --   --   < > = values in this interval not displayed.  HbA1C: Hgb A1c MFr Bld  Date/Time Value Ref Range Status  07/28/2017 02:35 AM 5.5 4.8 - 5.6 % Final    Comment:    (NOTE) Pre diabetes:          5.7%-6.4% Diabetes:              >  6.4% Glycemic control for   <7.0% adults with diabetes       Recent Results (from the past 240 hour(s))  Culture, blood (routine x 2)     Status: None   Collection Time: 07/25/17 12:15 PM  Result Value Ref Range Status   Specimen Description BLOOD RIGHT ANTECUBITAL  Final   Special Requests   Final    BOTTLES DRAWN AEROBIC AND ANAEROBIC Blood Culture adequate volume   Culture NO GROWTH 5 DAYS  Final   Report Status 07/30/2017 FINAL  Final  Urine culture     Status: None   Collection Time: 07/25/17  2:40 PM  Result Value Ref Range Status   Specimen Description URINE, CATHETERIZED  Final   Special Requests NONE  Final   Culture NO GROWTH  Final   Report Status 07/27/2017 FINAL  Final  MRSA PCR Screening     Status: None   Collection Time: 07/25/17  6:11 PM  Result Value Ref Range Status   MRSA by PCR NEGATIVE NEGATIVE Final    Comment:        The GeneXpert MRSA Assay (FDA approved for NASAL specimens only), is one component of a comprehensive MRSA colonization surveillance program. It is not intended to diagnose MRSA infection nor to guide or monitor treatment for MRSA infections.   Culture, blood (routine x 2)     Status: None   Collection Time: 07/25/17  6:34 PM  Result Value Ref Range Status   Specimen Description BLOOD RIGHT HAND  Final   Special Requests IN PEDIATRIC BOTTLE Blood Culture adequate volume  Final   Culture NO GROWTH 5 DAYS  Final   Report Status 07/31/2017 FINAL  Final     Scheduled Meds: . aspirin EC  81 mg Oral Daily  . atorvastatin  40 mg Oral q1800  . chlorhexidine  15 mL Mouth Rinse BID  . clopidogrel  75 mg Oral Daily  .  enoxaparin (LOVENOX) injection  30 mg Subcutaneous Q24H  . feeding supplement (PRO-STAT SUGAR FREE 64)  30 mL Per Tube Daily  . folic acid  1 mg Intravenous Daily  . mouth rinse  15 mL Mouth Rinse BID  . metoprolol tartrate  2.5 mg Intravenous Q6H  . tamsulosin  0.4 mg Oral Daily  . thiamine  100 mg Intravenous Daily  . traZODone  150 mg Oral QHS     LOS: 9 days   Cherene Altes, MD [facility] Office  [phone] Pager - Text Page per Amion as per below:  On-Call/Text Page:      [email or web address]      password [email or web address]  If 7PM-7AM, please contact night-coverage [email or web address] Password [email or web address] 08/03/2017, 1:03 PM

## 2017-08-03 NOTE — Progress Notes (Signed)
Physical Therapy Treatment Patient Details Name: David Lucero MRN: [id number] DOB: Apr 29, 1946 Today's Date: 08/03/2017    History of Present Illness This 71 y.o. male admitted after being found on floor of home by a friend.  He was found to by hypoterhmic.  WU:JWJXB; x-ray/CT showed Lt humerus fx (nonoperative), SIRS/aspiration PNA, rhabdomyolosis, Acute renal failure.  PMH includes:  COPD, PTSD, anxiety, depression, ETOH abuse, lung CA    PT Comments    Pt did not attempt to verbalize any during session even when cued. Lethargic but aroused to voice and followed simple commands about 50% of time. Needed max A to maintain sitting EOB with posterior lean and left gaze preference. PT will continue to follow.    Follow Up Recommendations  SNF;Supervision/Assistance - 24 hour     Equipment Recommendations  Wheelchair (measurements PT);Wheelchair cushion (measurements PT)    Recommendations for Other Services       Precautions / Restrictions Precautions Precautions: Fall Precaution Comments: LUE humerus fxs, rib fxs Required Braces or Orthoses: Sling Restrictions Weight Bearing Restrictions: Yes LUE Weight Bearing: Non weight bearing    Mobility  Bed Mobility Overal bed mobility: Needs Assistance Bed Mobility: Supine to Sit;Sit to Supine     Supine to sit: +2 for physical assistance;Total assist Sit to supine: Max assist;+2 for physical assistance   General bed mobility comments: Pt requires assist for all aspects.  He was able to assist minimally with lowering trunk toward bed   Transfers                 General transfer comment: pt balance not good enough EOB to attempt standing and pt not participating enough  Ambulation/Gait             General Gait Details: unable   Stairs            Wheelchair Mobility    Modified Rankin (Stroke Patients Only)       Balance Overall balance assessment: Needs assistance Sitting-balance support: Feet  supported Sitting balance-Leahy Scale: Zero Sitting balance - Comments: max A to maintain sitting, posterior lean Postural control: Posterior lean                                  Cognition Arousal/Alertness: Lethargic;Suspect due to medications Behavior During Therapy: Flat affect Overall Cognitive Status: Difficult to assess Area of Impairment: Orientation;Attention;Memory;Following commands;Safety/judgement;Problem solving                 Orientation Level: Disoriented to;Place;Time;Situation Current Attention Level: Focused Memory: Decreased short-term memory;Decreased recall of precautions Following Commands: Follows one step commands inconsistently;Follows one step commands with increased time Safety/Judgement: Decreased awareness of deficits;Decreased awareness of safety   Problem Solving: Slow processing;Decreased initiation;Difficulty sequencing;Requires verbal cues;Requires tactile cues General Comments: Difficult to assess since pt not speaking today, but followed simple one step commands about 50% of time      Exercises General Exercises - Lower Extremity Long Arc Quad: PROM;Both;10 reps;Seated    General Comments General comments (skin integrity, edema, etc.): pt with left gaze preference in sitting, would look to midline when cued      Pertinent Vitals/Pain Pain Assessment: Faces Faces Pain Scale: Hurts even more Pain Location: with movement Pain Descriptors / Indicators: Grimacing;Guarding Pain Intervention(s): Limited activity within patient's tolerance;Monitored during session    Home Living  Prior Function            PT Goals (current goals can now be found in the care plan section) Acute Rehab PT Goals Patient Stated Goal: to go home  PT Goal Formulation: With patient Time For Goal Achievement: 08/14/17 Potential to Achieve Goals: Fair Progress towards PT goals: Not progressing toward goals - comment  (lethargy)    Frequency    Min 2X/week      PT Plan Current plan remains appropriate    Co-evaluation              AM-PAC PT "6 Clicks" Daily Activity  Outcome Measure  Difficulty turning over in bed (including adjusting bedclothes, sheets and blankets)?: Unable Difficulty moving from lying on back to sitting on the side of the bed? : Unable Difficulty sitting down on and standing up from a chair with arms (e.g., wheelchair, bedside commode, etc,.)?: Unable Help needed moving to and from a bed to chair (including a wheelchair)?: Total Help needed walking in hospital room?: Total Help needed climbing 3-5 steps with a railing? : Total 6 Click Score: 6    End of Session Equipment Utilized During Treatment: Other (comment) (LUE sling ) Activity Tolerance: Patient limited by lethargy Patient left: in bed;with call bell/phone within reach;with bed alarm set Nurse Communication: Mobility status PT Visit Diagnosis: Other abnormalities of gait and mobility (R26.89);Muscle weakness (generalized) (M62.81);Unsteadiness on feet (R26.81);Pain Pain - Right/Left: Left Pain - part of body: Shoulder (toes bilaterally )     Time: 5993-5701 PT Time Calculation (min) (ACUTE ONLY): 18 min  Charges:  $Therapeutic Activity: 8-22 mins                    G Codes:       Leighton Roach, PT  Acute Rehab Services  [place] 08/03/2017, 12:45 PM

## 2017-08-04 DIAGNOSIS — I2111 ST elevation (STEMI) myocardial infarction involving right coronary artery: Secondary | ICD-10-CM

## 2017-08-04 LAB — GLUCOSE, CAPILLARY
GLUCOSE-CAPILLARY: 106 mg/dL — AB (ref 65–99)
GLUCOSE-CAPILLARY: 108 mg/dL — AB (ref 65–99)
Glucose-Capillary: 100 mg/dL — ABNORMAL HIGH (ref 65–99)
Glucose-Capillary: 104 mg/dL — ABNORMAL HIGH (ref 65–99)
Glucose-Capillary: 105 mg/dL — ABNORMAL HIGH (ref 65–99)
Glucose-Capillary: 125 mg/dL — ABNORMAL HIGH (ref 65–99)
Glucose-Capillary: 95 mg/dL (ref 65–99)
Glucose-Capillary: 98 mg/dL (ref 65–99)

## 2017-08-04 LAB — COMPREHENSIVE METABOLIC PANEL
ALBUMIN: 1.8 g/dL — AB (ref 3.5–5.0)
ALK PHOS: 144 U/L — AB (ref 38–126)
ALT: 20 U/L (ref 17–63)
AST: 31 U/L (ref 15–41)
Anion gap: 7 (ref 5–15)
BILIRUBIN TOTAL: 1.2 mg/dL (ref 0.3–1.2)
BUN: 13 mg/dL (ref 6–20)
CALCIUM: 7.4 mg/dL — AB (ref 8.9–10.3)
CO2: 22 mmol/L (ref 22–32)
Chloride: 105 mmol/L (ref 101–111)
Creatinine, Ser: 0.47 mg/dL — ABNORMAL LOW (ref 0.61–1.24)
GFR calc Af Amer: >60 mL/min (ref >60)
GLUCOSE: 108 mg/dL — AB (ref 65–99)
Potassium: 3.5 mmol/L (ref 3.5–5.1)
Sodium: 134 mmol/L — ABNORMAL LOW (ref 135–145)
TOTAL PROTEIN: 4.6 g/dL — AB (ref 6.5–8.1)

## 2017-08-04 LAB — CBC
HCT: 25.5 % — ABNORMAL LOW (ref 39.0–52.0)
Hemoglobin: 7.8 g/dL — ABNORMAL LOW (ref 13.0–17.0)
MCH: 23.5 pg — ABNORMAL LOW (ref 26.0–34.0)
MCHC: 30.6 g/dL (ref 30.0–36.0)
MCV: 76.8 fL — ABNORMAL LOW (ref 78.0–100.0)
Platelets: 265 10*3/uL (ref 150–400)
RBC: 3.32 MIL/uL — ABNORMAL LOW (ref 4.22–5.81)
RDW: 21.8 % — AB (ref 11.5–15.5)
WBC: 19.9 10*3/uL — ABNORMAL HIGH (ref 4.0–10.5)

## 2017-08-04 MED ORDER — BETHANECHOL CHLORIDE 10 MG PO TABS
10.0000 mg | ORAL_TABLET | Freq: Three times a day (TID) | ORAL | Status: DC
  Administered 2017-08-04 – 2017-08-07 (×9): 10 mg via ORAL
  Filled 2017-08-04 (×10): qty 1

## 2017-08-04 MED ORDER — JEVITY 1.2 CAL PO LIQD
1000.0000 mL | ORAL | Status: DC
  Administered 2017-08-04 (×2): 1000 mL
  Filled 2017-08-04 (×2): qty 1000

## 2017-08-04 MED ORDER — PIPERACILLIN-TAZOBACTAM 3.375 G IVPB
3.3750 g | Freq: Three times a day (TID) | INTRAVENOUS | Status: AC
  Administered 2017-08-04 – 2017-08-08 (×13): 3.375 g via INTRAVENOUS
  Filled 2017-08-04 (×14): qty 50

## 2017-08-04 MED ORDER — POTASSIUM CHLORIDE 20 MEQ/15ML (10%) PO SOLN
40.0000 meq | Freq: Once | ORAL | Status: AC
  Administered 2017-08-04: 40 meq
  Filled 2017-08-04: qty 30

## 2017-08-04 MED ORDER — PIPERACILLIN-TAZOBACTAM 3.375 G IVPB 30 MIN: 3.3750 g | Freq: Three times a day (TID) | INTRAVENOUS | Status: DC

## 2017-08-04 NOTE — Care Management (Addendum)
Case Management Note Initial Note Started By David Finner, RN Case Manager 07-28-17 Patient Details  Name: David Lucero MRN: [id number] Date of Birth: Feb 12, 1946  Subjective/Objective:    STEMI, rhabdomyolysis, AKI               Action/Plan: Discharge Planning: NCM spoke to pt at bedside. Pt agreeable to SNF rehab. Gave permission to speak to brother, [name] # [phone]. Attempted call to brother and no answer. Per Unit RN, Pt's neighbor and wife assist him as needed. He lives alone and per neighbor house is unkempt. They take him a meal each day and assist him with getting groceries/RX. Neighbor, [name]-friend/neighbor of 20 yrs [phone]. They called EMS when they found pt down. Pt was in the [employer]. Pt does not have any children. CSW referral for SNF. Waiting final recommendations. Will continue to follow for DC needs.  PCP [name] MD  Expected Discharge Date:  07/29/17                     Expected Discharge Plan:  [place]  In-House Referral:  Clinical Social Work  Discharge planning Services  CM Consult  Post Acute Care Choice:  NA Choice offered to:  NA  DME Arranged:  N/A DME Agency:  NA  HH Arranged:  NA HH Agency:  NA  Status of Service: Completed.   If discussed at [place] of Stay Meetings, dates discussed:  08-04-17, 08-06-17   Additional Comments:  [place] 08-06-17 Jacqlyn Krauss, RN,BSN [phone] Plan for PEG placement 08-06-17. CSW following for SNF placement. No further needs from CM at this time.    1641 08-04-17 Jacqlyn Krauss, RN,BSN [phone]  Pt continues with Cortrak for Severe Malnutrition- CM to monitor for Goals of Care. CSW following as well.

## 2017-08-04 NOTE — Progress Notes (Signed)
CSW met with patient and long time friends, David Lucero and David Lucero, at bedside. Patient gave consent to speak to Gasper Lloyd. CSW discussed disposition planning. David Lucero indicated patient has VA benefit. CSW discussed VA SNF vs SNF on Medicare benefit. Friends willing to help make medical decisions and hopeful to discuss with MD today; with questions about patient's condition. Notified MD. CSW following for disposition planning.  Estanislado Emms, [place]

## 2017-08-04 NOTE — Progress Notes (Signed)
SLP Cancellation Note  Patient Details Name: David Lucero MRN: [id number] DOB: December 05, 1945   Cancelled treatment:       Reason Eval/Treat Not Completed: Other (comment) Pt had just started a bath upon SLP arrival. Will return as able.    Germain Osgood 08/04/2017, 9:06 AM  Germain Osgood, M.A. CCC-SLP [phone]

## 2017-08-04 NOTE — Progress Notes (Signed)
Orthopedic Tech Progress Note Patient Details:  David Lucero [birth year] [id number]  Ortho Devices Type of Ortho Device: Sling immobilizer Ortho Device/Splint Location: lue Ortho Device/Splint Interventions: Application   Shakerra Red 08/04/2017, 11:05 AM

## 2017-08-04 NOTE — Progress Notes (Signed)
  Speech Language Pathology Treatment: Dysphagia  Patient Details Name: David Lucero MRN: [id number] DOB: June 07, 1946 Today's Date: 08/04/2017 Time: 3491-7915 SLP Time Calculation (min) (ACUTE ONLY): 14 min  Assessment / Plan / Recommendation Clinical Impression  Pt is more alert today but still does not consistently initiate a swallow response. He does not swallow to command, and I noted only one swallow in response to PO trials. Otherwise, pt makes attempts to orally manipulate the POs but then seems to let them spill passively into his pharynx, as evidenced by coughing response. His vocal quality remains wet at baseline, so I still worry about his ability to manage his own secretions. He is still not ready for a PO diet.   HPI HPI: 71 year old male patient with multiple comorbidities including: anxiety, alcohol abuse,GERD, COPD, PTSD, PAD, depression, lung cancer, laryngeal cancer, chronic obstructive pulmonary disease. Pt found down by a friend. CXR no acute cardiopulmonary abnormality seen. Imaging of humerus revealed moderately displaced and comminuted proximal left humeral head and neck fracture is noted. Found to have acute inferior ST elevation myocardial infarction, severe lactic acidosis, A-fib, hypokalemia, severe malnutrition. RN reported pt pocketing food.       SLP Plan  Continue with current plan of care       Recommendations  Diet recommendations: NPO Medication Administration: Via alternative means                Oral Care Recommendations: Oral care QID Follow up Recommendations: Skilled Nursing facility SLP Visit Diagnosis: Dysphagia, oropharyngeal phase (R13.12) Plan: Continue with current plan of care       GO                Germain Osgood 08/04/2017, 12:01 PM  Germain Osgood, M.A. CCC-SLP [phone]

## 2017-08-04 NOTE — Plan of Care (Signed)
Problem: Physical Regulation: Goal: Will remain free from infection Outcome: Progressing Pt has been afebrile this shift.

## 2017-08-04 NOTE — Progress Notes (Signed)
[facility] TEAM 1 - Stepdown/ICU TEAM  David Lucero  EXH:[id number] DOB: 02/09/1946 DOA: 07/25/2017 PCP: [name], MD    Brief Narrative:  71yo M with hx of anxiety, alcohol abuse, depression, lung cancer, and COPD for whom EMS was called the a.m. of 10/13 after the patient was found lying on the floor of his cold home by a friend. He had last been seen normal on 10/12 around noon. EMS noted ST elevation with left upper extremity trauma. He was found to be hypothermic and tremulous.  In the ED he complained of left arm pain, denied chest discomfort or shortness of breath. 12-lead showed atrial fibrillation, ST depression in V1 through V3, also concerns of nonspecific intraventricular conduction delay.  His initial temperature was 87.59F, he was bradycardic, and had rapid respiratory rate. His initial lactic acid was 16, hemoglobin 8. A chest x-ray demonstrated fracture of the proximal left humerus. His troponin was mildly elevated.  He was treated with warmed IV fluids, arm was immobilized, seen by Dr. Ellyn Hack who felt EKG was consistent with inferior MI. He recommended IV heparin, and supportive care for medical issues prior to further cardiac intervention. Given his degree of metabolic disarray critical care was asked to admit.  Significant Events: 10/13 - found down - admit to [facility] 10/14 TTE - EF 60-65% - poor quality - ?AoS   Subjective: The patient is much more alert today.  He can answer questions and provide some history.  He is oriented to place but not time or situation.  He denies chest pain nausea vomiting or shortness of breath.  He tells me he lives with his mother, but then corrects himself saying that his mother died years ago, and then tells me he lives alone.  He tells me he has no family and asked that I speak directly to him about his medical issues only.  He tells me he would not want to be kept alive on a life support machine, or put through CPR.    Assessment &  Plan:  Acute hypoxic resp failure - transient aspiration pneumonitis  Suspect the patient suffered and episode of aspiration of NG feeding 10/22 - immediate CXR was w/o acute findings - will f/u CXR in AM - attempt to wean O2 support back down today - w/ climbing WBC and spiking fevers will place on empiric abx coverage - will also check UA for completeness  Acute inferior ST elevation myocardial infarction marked inferior ST elevation improved with hydration and warming - Cardiology has now signed off - medical management only - TTE w/o evidence of signif LV impairment   Parox Afib Back in NSR  Rhabdomyolysis  Resolved w/ volume resuscitation and time   Bradycardia Resolved   Severe lactic acidosis resolved   Acute kidney injury renal function has normalized w/ volume resuscitation   Acute urinary retention Unable to void post foley removal again - replaced foley again - flomax was started this hospital stay - will add urecholine for the next attempt 10/24  Hypokalemia likley has signif total body deficit due to poor nutrition - cont to supplement to goal of 4.0  Severe malnutrition in setting of alcohol abuse S/p cortrak 10/19 - SLP re-eval 10/23 continues to suggest NPO is most appropriate - given dramatic improvement in his mental status over the last 24hrs will monitor w/ ongoing use of cortrak for now - perhaps with another 24-48hrs either his swallowing will improve or he will improved enough to be  able to weigh in on the decision regarding if PEG is appropriate - resume cortrak feeding at low rate and follow for recurrent aspiration - add GERD tx   Microcytic anemia Fe studies c/w severe malnutrition associated anemia - Hgb holding steady for now   History of laryngeal and lung cancer records not currently available - reportedly treated 10 years ago     Hyperglycemia A1c not c/w DM   Acute delirium - History of alcohol abuse / EtOH withdrawal CIWA protocol has been  completed - hx from neighbors/friends suggest a baseline of cognitive decline, perhaps due to EtOH, or possibly dementia of other cause - at present he appears to be clearing/improving - cont to follow - avoid sedatives as able   Left proximal humerus fracture Keep immobilized - Ortho reports wound is amenable to non-operative tx - non-weightbearing w/ continued use of sling   Goals of Care Pt has no family available to speak on his behalf - given his overall very poor state I do not feel that a code or use of mechanical ventilation would be appropriate - he is actually able to tell me himself today that he would not wish to be on a ventilator or put through CPR - while he is cognitively impaired to an extent at present, in my judgement he is resolute in his desire to be NCB and I do feel he is presently able to grasp this concept - I also wholeheartedly agree that intubation/mech vent, or CPR would constitute futile care - to assure due process is observed, we will request a second opinion from PCCM on 10/24, unless the patient's mental status has improved further and he can make the decision for himeslf  DVT prophylaxis: lovenox   Code Status: FULL CODE Family Communication: no family present at time of exam  Disposition Plan: SDU  Consultants:  PCCM Orthopedics Cardiology   Antimicrobials:  Zosyn 10/13 > 10/15 + 10/23 > Vanc 10/13 > 10/15  Objective: Blood pressure 137/88, pulse 96, temperature 100 F (37.8 C), temperature source Axillary, resp. rate (!) 25, height 5\' 7"  (1.702 m), weight 65.2 kg (143 lb 11.2 oz), SpO2 97 %.  Intake/Output Summary (Last 24 hours) at 08/04/17 1449 Last data filed at 08/04/17 0428  Gross per 24 hour  Intake                0 ml  Output              700 ml  Net             -700 ml   Filed Weights   08/02/17 0439 08/03/17 0349 08/04/17 0428  Weight: 67 kg (147 lb 12.8 oz) 65 kg (143 lb 4.8 oz) 65.2 kg (143 lb 11.2 oz)    Examination: General: No  acute respiratory distress - much more alert  Lungs: no focal crackles - improved air movement th/o - no wheezing  Cardiovascular: RRR Abdomen: NT/ND, soft, bowel sounds positive, no rebound Extremities: No edema B LE - no cyanosis   CBC:  Recent Labs Lab 07/31/17 0759 08/01/17 0346 08/02/17 0529 08/03/17 0358 08/04/17 0434  WBC 8.9 6.7 7.8 18.8* 19.9*  HGB 8.3* 8.3* 7.9* 8.1* 7.8*  HCT 27.1* 27.0* 26.0* 26.6* 25.5*  MCV 75.3* 74.6* 75.8* 77.1* 76.8*  PLT 272 250 230 200 161   Basic Metabolic Panel:  Recent Labs Lab 07/30/17 0346 07/31/17 0759 08/01/17 0346 08/02/17 0529 08/03/17 0358 08/04/17 0434  NA 137 135 134*  134* 133* 134*  K 3.2* 3.3* 3.2* 3.5 3.5 3.5  CL 103 103 103 105 105 105  CO2 22 18* 22 21* 21* 22  GLUCOSE 77 84 146* 136* 133* 108*  BUN <5* <5* <5* 6 10 13   CREATININE 0.49* 0.58* 0.47* 0.45* 0.63 0.47*  CALCIUM 7.5* 7.5* 7.3* 7.1* 7.2* 7.4*  MG 1.6* 1.7 1.8 2.1 1.8  --    GFR: Estimated Creatinine Clearance: 77 mL/min (A) (by C-G formula based on SCr of 0.47 mg/dL (L)).  Liver Function Tests:  Recent Labs Lab 08/04/17 0434  AST 31  ALT 20  ALKPHOS 144*  BILITOT 1.2  PROT 4.6*  ALBUMIN 1.8*   Cardiac Enzymes:  Recent Labs Lab 07/30/17 0346 07/31/17 0759 08/01/17 0346 08/02/17 0529 08/03/17 0358  CKTOTAL 484* 334 208 152 100    HbA1C: Hgb A1c MFr Bld  Date/Time Value Ref Range Status  07/28/2017 02:35 AM 5.5 4.8 - 5.6 % Final    Comment:    (NOTE) Pre diabetes:          5.7%-6.4% Diabetes:              >6.4% Glycemic control for   <7.0% adults with diabetes       Recent Results (from the past 240 hour(s))  MRSA PCR Screening     Status: None   Collection Time: 07/25/17  6:11 PM  Result Value Ref Range Status   MRSA by PCR NEGATIVE NEGATIVE Final    Comment:        The GeneXpert MRSA Assay (FDA approved for NASAL specimens only), is one component of a comprehensive MRSA colonization surveillance program. It is  not intended to diagnose MRSA infection nor to guide or monitor treatment for MRSA infections.   Culture, blood (routine x 2)     Status: None   Collection Time: 07/25/17  6:34 PM  Result Value Ref Range Status   Specimen Description BLOOD RIGHT HAND  Final   Special Requests IN PEDIATRIC BOTTLE Blood Culture adequate volume  Final   Culture NO GROWTH 5 DAYS  Final   Report Status 07/31/2017 FINAL  Final     Scheduled Meds: . aspirin  81 mg Per Tube Daily  . atorvastatin  40 mg Per Tube q1800  . chlorhexidine  15 mL Mouth Rinse BID  . clopidogrel  75 mg Per Tube Daily  . enoxaparin (LOVENOX) injection  30 mg Subcutaneous Q24H  . mouth rinse  15 mL Mouth Rinse BID  . metoprolol tartrate  12.5 mg Per Tube BID  . tamsulosin  0.4 mg Oral Daily     LOS: 10 days   Cherene Altes, MD [facility] Office  [phone] Pager - Text Page per Shea Evans as per below:  On-Call/Text Page:      [email or web address]      password [email or web address]  If 7PM-7AM, please contact night-coverage [email or web address] Password [email or web address] 08/04/2017, 2:49 PM

## 2017-08-05 DIAGNOSIS — F10221 Alcohol dependence with intoxication delirium: Secondary | ICD-10-CM

## 2017-08-05 DIAGNOSIS — R1312 Dysphagia, oropharyngeal phase: Secondary | ICD-10-CM

## 2017-08-05 LAB — CBC
HCT: 26.1 % — ABNORMAL LOW (ref 39.0–52.0)
HEMOGLOBIN: 8 g/dL — AB (ref 13.0–17.0)
MCH: 23.7 pg — AB (ref 26.0–34.0)
MCHC: 30.7 g/dL (ref 30.0–36.0)
MCV: 77.4 fL — ABNORMAL LOW (ref 78.0–100.0)
Platelets: 249 10*3/uL (ref 150–400)
RBC: 3.37 MIL/uL — AB (ref 4.22–5.81)
RDW: 21.8 % — ABNORMAL HIGH (ref 11.5–15.5)
WBC: 11.8 10*3/uL — ABNORMAL HIGH (ref 4.0–10.5)

## 2017-08-05 LAB — BASIC METABOLIC PANEL
Anion gap: 6 (ref 5–15)
BUN: 12 mg/dL (ref 6–20)
CALCIUM: 7.4 mg/dL — AB (ref 8.9–10.3)
CO2: 24 mmol/L (ref 22–32)
CREATININE: 0.41 mg/dL — AB (ref 0.61–1.24)
Chloride: 107 mmol/L (ref 101–111)
GFR calc non Af Amer: >60 mL/min (ref >60)
GLUCOSE: 109 mg/dL — AB (ref 65–99)
Potassium: 3.1 mmol/L — ABNORMAL LOW (ref 3.5–5.1)
Sodium: 137 mmol/L (ref 135–145)

## 2017-08-05 LAB — GLUCOSE, CAPILLARY
GLUCOSE-CAPILLARY: 106 mg/dL — AB (ref 65–99)
GLUCOSE-CAPILLARY: 106 mg/dL — AB (ref 65–99)
Glucose-Capillary: 106 mg/dL — ABNORMAL HIGH (ref 65–99)
Glucose-Capillary: 100 mg/dL — ABNORMAL HIGH (ref 65–99)
Glucose-Capillary: 104 mg/dL — ABNORMAL HIGH (ref 65–99)

## 2017-08-05 LAB — MAGNESIUM: Magnesium: 2.1 mg/dL (ref 1.7–2.4)

## 2017-08-05 LAB — POTASSIUM: Potassium: 3.9 mmol/L (ref 3.5–5.1)

## 2017-08-05 MED ORDER — POTASSIUM CHLORIDE 20 MEQ/15ML (10%) PO SOLN
60.0000 meq | Freq: Once | ORAL | Status: AC
Start: 2017-08-05 — End: 2017-08-05
  Administered 2017-08-05: 60 meq
  Filled 2017-08-05: qty 45

## 2017-08-05 NOTE — Progress Notes (Signed)
CSW sent out initial SNF referrals. Patient's friend [name] indicated patient has a VA benefit. CSW left message for veterans coordinator at [facility] regarding [place] SNF referral. CSW to follow and give bed offers when available. CSW to support with discharge.  David Lucero, [place]

## 2017-08-05 NOTE — Progress Notes (Signed)
No urinary O/P, Dr Sherral Hammers notified, Bladder scan showed 230 ml.  Edward Qualia RN

## 2017-08-05 NOTE — Progress Notes (Signed)
PROGRESS NOTE    David Lucero  ZOX:[id number] DOB: 11/14/1945 DOA: 07/25/2017 PCP: [name], MD   Brief Narrative:  71 year old WM PMHx PTSD,Anxiety,Depression EtOH abuse, Lung cancer, COPD,PAD  EMS was called the a.m. of 10/13 after the patient was found lying on the floor of his home by a friend. The house was cold. He had last been seen normal on 10/12 around noon. On EMS arrival he was noted to have ST elevation with left upper extremity trauma. He was found to be hypothermic and tremulous, he was transferred to [facility] emergency room ER evaluation: In ER complained of left arm pain, denied chest discomfort or shortness of breath. 12-lead showed atrial fibrillation, ST depression in V1 through V3, also concerns of nonspecific intraventricular conduction delay His initial temperature was 87.9 Fahrenheit, he was bradycardic, and had rapid respiratory rate. His initial lactic acid was 16, hemoglobin 8. A chest x-ray demonstrated fracture of the proximal left humerus. His troponin was mildly elevated. He was treated with warmed IV fluids, arm was immobilized, seen by Dr. Ellyn Hack who felt EKG was consistent with inferior MI. He recommended IV heparin, and supportive care for medical issues prior to further cardiac intervention. Given his degree of metabolic disarray critical care was asked to admit.    Subjective: 10/24 A/O 2 (does not know when, why), negative SOB, negative abdominal pain, negative chest pain, negative N/V.         Assessment & Plan:   Active Problems:   STEMI (ST elevation myocardial infarction) ([place])   SIRS/Aspiration Pneumonia -Per Dr. Garen Lah note patient spiking fevers again. Restarted and empiric antibiotics. Possible aspiration on 10/22 -Continue leukocytosis but improved after starting empiric antibiotics. -Ensure head of bed> 45 45 -Out of bed to chair q shift -Cultures pending -PCXR 10/25   Acute Inferior STEMI -Although cardiology  believes has a high probability of obstructive CAD, NOT a candidate for cardiac catheterization  -Echocardiogram; Limited without significant LV impairment. See results below -strict in and out since admission +7.3 L -Daily weight Filed Weights   08/03/17 0349 08/04/17 0428 08/05/17 0402  Weight: 143 lb 4.8 oz (65 kg) 143 lb 11.2 oz (65.2 kg) 138 lb 8 oz (62.8 kg)  -Transfuse for hemoglobin< 8  Paroxysmal Atrial fibrillation  -Currently in NSR    Rhabdomyolysis       Recent Labs Lab 07/30/17 0346 07/31/17 0759 08/01/17 0346 08/02/17 0529 08/03/17 0358  CKTOTAL 484* 334 208 152 100  Resolved  Acute renal failure   Recent Labs Lab 07/31/17 0759 08/01/17 0346 08/02/17 0529 08/03/17 0358 08/04/17 0434 08/05/17 0505  CREATININE 0.58* 0.47* 0.45* 0.63 0.47* 0.41*  -resolved    Acute urinary retention - 10/21 DC Foley place condom cath. -Flomax 0.4 mg daily   Hypokalemia -Potassium goal> 4 -Potassium 60 mEq -Repeat K/Mg @1500    Hypomagnesemia -Magnesium goal> 2  Severe protein calorie malnutrition in setting of alcohol abuse/Dysphagia -Advance diet as mental status allows, not presently safe to begin diet due to AMS -10/19 S/P CorTrak placement; Tube feeds per nutrition: tube feeds at goal -[place] attempted to contact brother again today was also unsuccessful. They discussed case in the plan will be to doctors will chart PEG tube required and PEG will be placed next week if patient still does not pass swallow study. In the meantime she will attempt to have neighbor agreed to sign the patient placement to [facility] or SNF -10/23 failed swallow study. Although PEG tube placement will not totally  eliminate risk of aspiration it will decrease the risk. Believe placement of PEG tube is patient's only reasonable avenue of adequate nutrition at this time. -10/24 [facility] M Dr. Roselie Awkward agrees PEG tube placement is required. On 10/25 will request IR placement of PEG tube.     Microcytic anemia    Recent Labs Lab 07/31/17 0759 08/01/17 0346 08/02/17 0529 08/03/17 0358 08/04/17 0434 08/05/17 0505  HGB 8.3* 8.3* 7.9* 8.1* 7.8* 8.0*  -stable continue to monitor closely -10/16 transfused 1 unit PRBC  History of laryngeal and lung cancer -records not currently available  -Reportedly treated 10 years ago      Hyperglycemia -10/16 Hemoglobin A1c= 5.5 not consistent with prediabetes/diabetes   Acute delirium - History of alcohol abuse / EtOH withdrawal -CIWA protocol DC'd.  -neighbor/friend suggested cognitive decline secondary EtOH? vs dementia?  -Continued waxing and waning cognition -out of bed to chair, bedside sitter, remove mittens   Left proximal humerus fracture -patient admitted on 10/13 now outside window for alcohol withdrawal. DC CIWA protocol  Goals of care -10/18 PT/OT consult: Patient with acute STEMI not candidate for invasive procedure. EtOH abuse with EtOH delirium, and rhabdomyolysis evaluate for CIR vs SNF vs LTAC -10/19 attempted to contact brother using phone number and demographics, no answer. Consult to LCSW will need to track down brother prior to placement -10/24; I do not believe that it would be in the patient's best interest to resuscitate him as patient clearly has stated would not want mechanical ventilation therefore believe patient should be made DO NOT RESUSCITATE. No family available have requested review of case by outside physician. [facility] M Dr. Roselie Awkward concurs that patient should be DO NOT RESUSCITATE as that although patient is confused at times he has clearly stated would not want mechanical life support or CPR.   DVT prophylaxis: IV heparin Code Status: full Family Communication: one Disposition Plan: TBD   Consultants:  [facility]    Procedures/Significant Events:  10/14 Echocardiogram:-LVEF 60% to 65%. - Aorta: Poorly visualied - calcified leafelts, possible stenosis. - Right  ventricle: The cavity size was mildly dilated. Mild   hypokinesis. 10/16 transfused 1 unit PRBC 10/19 CorTrak tube placed   I have personally reviewed and interpreted all radiology studies and my findings are as above.  VENTILATOR SETTINGS:    Cultures 10/24 blood pending 10/24 urine pending    Antimicrobials:Anti-infectives    Start     Stop   08/04/17 1600  piperacillin-tazobactam (ZOSYN) IVPB 3.375 g         08/04/17 1530  piperacillin-tazobactam (ZOSYN) IVPB 3.375 g  Status:  Discontinued     08/04/17 1536   07/26/17 1000  vancomycin (VANCOCIN) IVPB 750 mg/150 ml premix  Status:  Discontinued     07/27/17 1147   07/25/17 2300  piperacillin-tazobactam (ZOSYN) IVPB 3.375 g  Status:  Discontinued     07/27/17 1147   07/25/17 1630  vancomycin (VANCOCIN) IVPB 1000 mg/200 mL premix  Status:  Discontinued     07/26/17 0955   07/25/17 1630  piperacillin-tazobactam (ZOSYN) IVPB 3.375 g     07/25/17 1708       Devices   LINES / TUBES:  CorTrak tube 10/19>>>    Continuous Infusions: . feeding supplement (JEVITY 1.2 CAL) 1,000 mL (08/04/17 2130)  . piperacillin-tazobactam (ZOSYN)  IV Stopped (08/05/17 0511)     Objective: Vitals:   08/04/17 2055 08/05/17 0003 08/05/17 0402 08/05/17 0700  BP: 121/73 (!) 117/51 129/60  Pulse: 98 79 (!) 44   Resp: 20 16 18    Temp: 98.8 F (37.1 C) 98.9 F (37.2 C) 98.7 F (37.1 C) 98.7 F (37.1 C)  TempSrc:   Axillary Axillary  SpO2: 100% 100% 96%   Weight:   138 lb 8 oz (62.8 kg)   Height:        Intake/Output Summary (Last 24 hours) at 08/05/17 0834 Last data filed at 08/05/17 0600  Gross per 24 hour  Intake              380 ml  Output             1252 ml  Net             -872 ml   Filed Weights   08/03/17 0349 08/04/17 0428 08/05/17 0402  Weight: 143 lb 4.8 oz (65 kg) 143 lb 11.2 oz (65.2 kg) 138 lb 8 oz (62.8 kg)    Physical Exam:   General: A/O 2 (does not know when, why), cooperative, No acute respiratory  distress, cachectic ENT: Negative Runny nose, negative gingival bleeding,CorTrack tube present Lungs: Clear to auscultation bilaterally without wheezes or crackles Cardiovascular: Regular rate and rhythm without murmur gallop or rub normal S1 and S2 Abdomen: negative abdominal pain, nondistended, positive soft, bowel sounds, no rebound, no ascites, no appreciable mass Extremities: No significant cyanosis, clubbing, or edema bilateral lower extremities Skin: Negative rashes, lesions, ulcers Psychiatric:  Unable to assess secondary to altered mental status  Central nervous system:  Cranial nerves II through XII intact, tongue/uvula midline, all extremities muscle strength 5/5, sensation intact throughout, positive dysarthria, negative expressive aphasia, negative receptive aphasia.      Data Reviewed: Care during the described time interval was provided by me .  I have reviewed this patient's available data, including medical history, events of note, physical examination, and all test results as part of my evaluation.   CBC:  Recent Labs Lab 08/01/17 0346 08/02/17 0529 08/03/17 0358 08/04/17 0434 08/05/17 0505  WBC 6.7 7.8 18.8* 19.9* 11.8*  HGB 8.3* 7.9* 8.1* 7.8* 8.0*  HCT 27.0* 26.0* 26.6* 25.5* 26.1*  MCV 74.6* 75.8* 77.1* 76.8* 77.4*  PLT 250 230 200 265 161   Basic Metabolic Panel:  Recent Labs Lab 07/30/17 0346 07/31/17 0759 08/01/17 0346 08/02/17 0529 08/03/17 0358 08/04/17 0434 08/05/17 0505  NA 137 135 134* 134* 133* 134* 137  K 3.2* 3.3* 3.2* 3.5 3.5 3.5 3.1*  CL 103 103 103 105 105 105 107  CO2 22 18* 22 21* 21* 22 24  GLUCOSE 77 84 146* 136* 133* 108* 109*  BUN <5* <5* <5* 6 10 13 12   CREATININE 0.49* 0.58* 0.47* 0.45* 0.63 0.47* 0.41*  CALCIUM 7.5* 7.5* 7.3* 7.1* 7.2* 7.4* 7.4*  MG 1.6* 1.7 1.8 2.1 1.8  --   --    GFR: Estimated Creatinine Clearance: 74.1 mL/min (A) (by C-G formula based on SCr of 0.41 mg/dL (L)). Liver Function Tests:  Recent  Labs Lab 08/04/17 0434  AST 31  ALT 20  ALKPHOS 144*  BILITOT 1.2  PROT 4.6*  ALBUMIN 1.8*   No results for input(s): LIPASE, AMYLASE in the last 168 hours. No results for input(s): AMMONIA in the last 168 hours. Coagulation Profile: No results for input(s): INR, PROTIME in the last 168 hours. Cardiac Enzymes:  Recent Labs Lab 07/30/17 0346 07/31/17 0759 08/01/17 0346 08/02/17 0529 08/03/17 0358  CKTOTAL 484* 334 208 152 100   BNP (last 3  results) No results for input(s): PROBNP in the last 8760 hours. HbA1C: No results for input(s): HGBA1C in the last 72 hours. CBG:  Recent Labs Lab 08/04/17 1531 08/04/17 2039 08/05/17 0003 08/05/17 0507 08/05/17 0753  GLUCAP 95 100* 104* 106* 100*   Lipid Profile: No results for input(s): CHOL, HDL, LDLCALC, TRIG, CHOLHDL, LDLDIRECT in the last 72 hours. Thyroid Function Tests: No results for input(s): TSH, T4TOTAL, FREET4, T3FREE, THYROIDAB in the last 72 hours. Anemia Panel: No results for input(s): VITAMINB12, FOLATE, FERRITIN, TIBC, IRON, RETICCTPCT in the last 72 hours. Urine analysis:    Component Value Date/Time   COLORURINE YELLOW 07/25/2017 1440   APPEARANCEUR CLEAR 07/25/2017 1440   LABSPEC 1.010 07/25/2017 1440   PHURINE 6.0 07/25/2017 1440   GLUCOSEU NEGATIVE 07/25/2017 1440   HGBUR LARGE (A) 07/25/2017 1440   BILIRUBINUR NEGATIVE 07/25/2017 1440   KETONESUR 20 (A) 07/25/2017 1440   PROTEINUR 30 (A) 07/25/2017 1440   UROBILINOGEN 0.2 04/30/2014 1350   NITRITE NEGATIVE 07/25/2017 1440   LEUKOCYTESUR NEGATIVE 07/25/2017 1440   Sepsis Labs: @LABRCNTIP (procalcitonin:4,lacticidven:4)  ) No results found for this or any previous visit (from the past 240 hour(s)).       Radiology Studies: Dg Chest Port 1 View  Result Date: 08/03/2017 CLINICAL DATA:  Abnormal color of sputum Hx of COPD, lung ca, PAD. Smoker x30 years EXAM: PORTABLE CHEST 1 VIEW COMPARISON:  07/30/2017 FINDINGS: Normal cardiac  silhouette. Bilateral pleural effusions. Mild central venous congestion. No focal infiltrate. NG tube extends the stomach. IMPRESSION: 1. Bilateral pleural effusions and venous congestion. 2. Feeding tube extends into stomach. Electronically Signed   By: Suzy Bouchard M.D.   On: 08/03/2017 13:38        Scheduled Meds: . aspirin  81 mg Per Tube Daily  . atorvastatin  40 mg Per Tube q1800  . bethanechol  10 mg Oral TID  . chlorhexidine  15 mL Mouth Rinse BID  . clopidogrel  75 mg Per Tube Daily  . enoxaparin (LOVENOX) injection  30 mg Subcutaneous Q24H  . mouth rinse  15 mL Mouth Rinse BID  . metoprolol tartrate  12.5 mg Per Tube BID  . tamsulosin  0.4 mg Oral Daily   Continuous Infusions: . feeding supplement (JEVITY 1.2 CAL) 1,000 mL (08/04/17 2130)  . piperacillin-tazobactam (ZOSYN)  IV Stopped (08/05/17 0511)     LOS: 11 days    Time spent: 40 minutes    Mahamadou Weltz, Geraldo Docker, MD [facility] Pager [phone]   If 7PM-7AM, please contact night-coverage [email or web address] Password [facility] 08/05/2017, 8:34 AM

## 2017-08-05 NOTE — Progress Notes (Signed)
Occupational Therapy Treatment Patient Details Name: David Lucero MRN: [id number] DOB: [birth year] Today's Date: 08/05/2017    History of present illness This 71 y.o. male admitted after being found on floor of home by a friend.  He was found to by hypoterhmic.  IW:LNLGX; x-ray/CT showed Lt humerus fx (nonoperative), SIRS/aspiration PNA, rhabdomyolosis, Acute renal failure.  PMH includes:  COPD, PTSD, anxiety, depression, ETOH abuse, lung CA   OT comments  Pt able to transfer to [facility] with one person moderate assistance. Attempted to perform pericare. Improved ability to assist with bed mobility. Pt continues to demonstrate impaired cognition. Will continue to follow.  Follow Up Recommendations  SNF;Supervision/Assistance - 24 hour    Equipment Recommendations  None recommended by OT    Recommendations for Other Services      Precautions / Restrictions Precautions Precautions: Fall Precaution Comments: LUE humerus fxs, rib fxs Required Braces or Orthoses: Sling Restrictions Weight Bearing Restrictions: Yes LUE Weight Bearing: Non weight bearing       Mobility Bed Mobility Overal bed mobility: Needs Assistance Bed Mobility: Supine to Sit;Sit to Supine     Supine to sit: Mod assist Sit to supine: Supervision   General bed mobility comments: assist to raise trunk and to advance LEs off EOB, performed bed mobility x 2  Transfers Overall transfer level: Needs assistance Equipment used: 1 person hand held assist Transfers: Sit to/from [employer] Transfers Sit to Stand: Mod assist Stand pivot transfers: Mod assist       General transfer comment: attempted to perform pericare, but unable to balance in standing without holding on with his R hand    Balance Overall balance assessment: Needs assistance   Sitting balance-Leahy Scale: Fair Sitting balance - Comments: supervision at EOB     Standing balance-Leahy Scale: Poor Standing balance comment: moderate  assist                            ADL either performed or assessed with clinical judgement   ADL Overall ADL's : Needs assistance/impaired Eating/Feeding: NPO   Grooming: Wash/dry hands;Sitting;Maximal assistance               Lower Body Dressing: Total assistance;Sit to/from stand   Toilet Transfer: Moderate assistance;Stand-pivot;BSC   Toileting- Clothing Manipulation and Hygiene: Sit to/from stand;Moderate assistance               Vision       Perception     Praxis      Cognition Arousal/Alertness: Awake/alert Behavior During Therapy: WFL for tasks assessed/performed Overall Cognitive Status: Impaired/Different from baseline Area of Impairment: Orientation;Safety/judgement;Following commands;Memory;Problem solving                 Orientation Level: Disoriented to;Place;Time;Situation Current Attention Level: Sustained Memory: Decreased short-term memory;Decreased recall of precautions Following Commands: Follows one step commands with increased time Safety/Judgement: Decreased awareness of deficits;Decreased awareness of safety   Problem Solving: Slow processing;Decreased initiation;Difficulty sequencing;Requires verbal cues;Requires tactile cues          Exercises     Shoulder Instructions       General Comments      Pertinent Vitals/ Pain       Pain Assessment: Faces Faces Pain Scale: Hurts little more Pain Location: L UE/ribs Pain Descriptors / Indicators: Grimacing;Guarding;Moaning Pain Intervention(s): Monitored during session;Repositioned;Patient requesting pain meds-RN notified  Home Living  Prior Functioning/Environment              Frequency  Min 2X/week        Progress Toward Goals  OT Goals(current goals can now be found in the care plan section)  Progress towards OT goals: Progressing toward goals  Acute Rehab OT Goals Patient Stated  Goal: to go home  OT Goal Formulation: With patient Time For Goal Achievement: 08/14/17 Potential to Achieve Goals: Good  Plan Discharge plan remains appropriate    Co-evaluation                 AM-PAC PT "6 Clicks" Daily Activity     Outcome Measure   Help from another person eating meals?: Total Help from another person taking care of personal grooming?: A Lot Help from another person toileting, which includes using toliet, bedpan, or urinal?: A Lot Help from another person bathing (including washing, rinsing, drying)?: A Lot Help from another person to put on and taking off regular upper body clothing?: A Lot Help from another person to put on and taking off regular lower body clothing?: Total 6 Click Score: 10    End of Session Equipment Utilized During Treatment: Oxygen;Gait belt (sling)  OT Visit Diagnosis: Unsteadiness on feet (R26.81);Cognitive communication deficit (R41.841)   Activity Tolerance Patient tolerated treatment well   Patient Left in bed;with call bell/phone within reach;with bed alarm set;with restraints reapplied (mitt on L hand)   Nurse Communication Other (comment) (aware pt wants ativan and had a BM)        Time: 5035-4656 OT Time Calculation (min): 23 min  Charges: OT General Charges $OT Visit: 1 Visit OT Treatments $Self Care/Home Management : 23-37 mins  08/05/2017 Nestor Lewandowsky, OTR/L Pager: [phone] Malka So 08/05/2017, 10:08 AM
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
## 2017-08-06 ENCOUNTER — Inpatient Hospital Stay (HOSPITAL_COMMUNITY): Payer: Medicare Other

## 2017-08-06 DIAGNOSIS — S42412A Displaced simple supracondylar fracture without intercondylar fracture of left humerus, initial encounter for closed fracture: Secondary | ICD-10-CM

## 2017-08-06 LAB — GLUCOSE, CAPILLARY
GLUCOSE-CAPILLARY: 92 mg/dL (ref 65–99)
GLUCOSE-CAPILLARY: 88 mg/dL (ref 65–99)
Glucose-Capillary: 79 mg/dL (ref 65–99)

## 2017-08-06 MED ORDER — JEVITY 1.2 CAL PO LIQD
1000.0000 mL | Freq: Every day | ORAL | Status: DC
  Administered 2017-08-06 – 2017-08-07 (×2): 1000 mL
  Filled 2017-08-06 (×3): qty 1000

## 2017-08-06 MED ORDER — GI COCKTAIL ~~LOC~~
30.0000 mL | Freq: Once | ORAL | Status: AC
  Administered 2017-08-06: 30 mL
  Filled 2017-08-06: qty 30

## 2017-08-06 MED ORDER — LORAZEPAM 0.5 MG PO TABS
0.5000 mg | ORAL_TABLET | Freq: Once | ORAL | Status: AC
  Administered 2017-08-06: 0.5 mg
  Filled 2017-08-06: qty 1

## 2017-08-06 NOTE — Care Management Important Message (Signed)
Important Message  Patient Details  Name: ANQUAN AZZARELLO MRN: [id number] Date of Birth: 04-02-1946   Medicare Important Message Given:  Yes    Nathen May 08/06/2017, 10:43 AM

## 2017-08-06 NOTE — Progress Notes (Addendum)
Pt refused bath this morning

## 2017-08-06 NOTE — Progress Notes (Signed)
Patient asked for something for his acid reflux and some ativan. MD paged

## 2017-08-06 NOTE — Progress Notes (Signed)
Nutrition Follow-up  DOCUMENTATION CODES:   Severe malnutrition in context of chronic illness  INTERVENTION:   Resume TF after PEG placed:  Jevity 1.2 at 65 ml/h   Pro-stat 30 ml once daily  Provides 1972 kcal, 102 gm protein, 1264 ml free water daily  Will also need free water flushes, recommend 150 ml QID  NUTRITION DIAGNOSIS:   Malnutrition (severe) related to chronic illness (COPD/EtOH abuse) as evidenced by severe depletion of muscle mass, severe depletion of body fat.  Ongoing  GOAL:   Patient will meet greater than or equal to 90% of their needs  Met with TF  MONITOR:   TF tolerance, Labs, Weight trends, I & O's  ASSESSMENT:   Pt with PMH of PTSD, PAD, COPD, EtOH abuse, hx of throat and lung cancer presents with STEMI, humerus fracture, rhabdomyolysis  SLP following for dysphagia. Noted prognosis to return to PO's may be limited. Plans for PEG placement today.  TF on hold this morning for procedure today.  Previously receiving Jevity 1.2 via Cortrak tube at 65 ml/h with Prostat 30 ml once daily to provide 1972 kcals, 102 gm protein, 1264 ml free water daily.  Labs and medications reviewed.  IVF: NSL  Diet Order:  Diet NPO time specified  Skin:  Reviewed, no issues  Last BM:  10/25  Height:   Ht Readings from Last 1 Encounters:  07/25/17 '5\' 7"'$  (1.702 m)    Weight:   Wt Readings from Last 1 Encounters:  08/06/17 139 lb 1.6 oz (63.1 kg)    Ideal Body Weight:  67.3 kg  BMI:  Body mass index is 21.79 kg/m.  Estimated Nutritional Needs:   Kcal:  1800-2000  Protein:  90-100 grams  Fluid:  >/= 1.8 L/d  EDUCATION NEEDS:   Education needs no appropriate at this time  Molli Barrows, [place], [place], [place] Pager [phone] After Hours Pager [phone]

## 2017-08-06 NOTE — Progress Notes (Addendum)
PROGRESS NOTE    David Lucero  OZD:[id number] DOB: [birth year] DOA: 07/25/2017 PCP: [name], MD   Brief Narrative:  71 year old WM PMHx PTSD,Anxiety,Depression EtOH abuse, Lung cancer, COPD,PAD  EMS was called the a.m. of 10/13 after the patient was found lying on the floor of his home by a friend. The house was cold. He had last been seen normal on 10/12 around noon. On EMS arrival he was noted to have ST elevation with left upper extremity trauma. He was found to be hypothermic and tremulous, he was transferred to [facility] emergency room ER evaluation: In ER complained of left arm pain, denied chest discomfort or shortness of breath. 12-lead showed atrial fibrillation, ST depression in V1 through V3, also concerns of nonspecific intraventricular conduction delay His initial temperature was 87.9 Fahrenheit, he was bradycardic, and had rapid respiratory rate. His initial lactic acid was 16, hemoglobin 8. A chest x-ray demonstrated fracture of the proximal left humerus. His troponin was mildly elevated. He was treated with warmed IV fluids, arm was immobilized, seen by Dr. Ellyn Hack who felt EKG was consistent with inferior MI. He recommended IV heparin, and supportive care for medical issues prior to further cardiac intervention. Given his degree of metabolic disarray critical care was asked to admit.    Subjective: 10/25  A/O 2 (does not know when, why), negative SOB, negative abdominal pain, negative chest pain, negative N/V. Positive left shoulder pain.           Assessment & Plan:   Active Problems:   STEMI (ST elevation myocardial infarction) ([place])   SIRS/Aspiration Pneumonia -Per Dr. Garen Lah note patient spiking fevers again. Restarted and empiric antibiotics. Possible aspiration on 10/22 -Continue leukocytosis but improved after starting empiric antibiotics. -Ensure head of bed> 45 45 -Out of bed to chair q shift -Cultures pending -PCXR 10/25. May show  density in RML see results below   Acute Inferior STEMI -Although cardiology believes has a high probability of obstructive CAD, NOT a candidate for cardiac catheterization  -Echocardiogram; Limited without significant LV impairment. See results below -strict in and out since admission +6.7 L -Daily weight Filed Weights   08/04/17 0428 08/05/17 0402 08/06/17 0408  Weight: 143 lb 11.2 oz (65.2 kg) 138 lb 8 oz (62.8 kg) 139 lb 1.6 oz (63.1 kg)  -Transfuse for hemoglobin< 8  Paroxysmal Atrial fibrillation  -Currently in NSR    Rhabdomyolysis       Recent Labs Lab 07/31/17 0759 08/01/17 0346 08/02/17 0529 08/03/17 0358  CKTOTAL 334 208 152 100  Resolved  Acute renal failure   Recent Labs Lab 07/31/17 0759 08/01/17 0346 08/02/17 0529 08/03/17 0358 08/04/17 0434 08/05/17 0505  CREATININE 0.58* 0.47* 0.45* 0.63 0.47* 0.41*  -resolved    Acute urinary retention - 10/21 DC Foley place condom cath. -Flomax 0.4 mg daily   Hypokalemia -Potassium goal> 4  Hypomagnesemia -Magnesium goal> 2  Severe protein calorie malnutrition in setting of alcohol abuse/Dysphagia -Advance diet as mental status allows, not presently safe to begin diet due to AMS -10/19 S/P CorTrak placement; Tube feeds per nutrition: tube feeds at goal -[place] attempted to contact brother again today was also unsuccessful. They discussed case in the plan will be to doctors will chart PEG tube required and PEG will be placed next week if patient still does not pass swallow study. In the meantime she will attempt to have neighbor agreed to sign the patient placement to [facility] or SNF -10/23 failed swallow study. Although PEG  tube placement will not totally eliminate risk of aspiration it will decrease the risk. Believe placement of PEG tube is patient's only reasonable avenue of adequate nutrition at this time. -10/24 [facility] M Dr. Roselie Awkward agrees PEG tube placement is required. On 10/25 will request IR  placement of PEG tube.   Microcytic anemia    Recent Labs Lab 07/31/17 0759 08/01/17 0346 08/02/17 0529 08/03/17 0358 08/04/17 0434 08/05/17 0505  HGB 8.3* 8.3* 7.9* 8.1* 7.8* 8.0*  -stable continue to monitor closely -10/16 transfused 1 unit PRBC  History of laryngeal and lung cancer -records not currently available  -Reportedly treated 10 years ago      Hyperglycemia -10/16 Hemoglobin A1c= 5.5 not consistent with prediabetes/diabetes   Acute delirium - History of alcohol abuse / EtOH withdrawal -CIWA protocol DC'd.  -neighbor/friend suggested cognitive decline secondary EtOH? vs dementia?  -Continued waxing and waning cognition -out of bed to chair   Left proximal humerus fracture -patient admitted on 10/13 now outside window for alcohol withdrawal. DC CIWA protocol  Goals of care -10/18 PT/OT consult: Patient with acute STEMI not candidate for invasive procedure. EtOH abuse with EtOH delirium, and rhabdomyolysis evaluate for CIR vs SNF vs LTAC -10/19 attempted to contact brother using phone number and demographics, no answer. Consult to LCSW will need to track down brother prior to placement -10/24; I do not believe that it would be in the patient's best interest to resuscitate him as patient clearly has stated would not want mechanical ventilation therefore believe patient should be made DO NOT RESUSCITATE. No family available have requested review of case by outside physician. [facility] M Dr. Roselie Awkward concurs that patient should be DO NOT RESUSCITATE as that although patient is confused at times he has clearly stated would not want mechanical life support or CPR.   DVT prophylaxis: IV heparin Code Status: full Family Communication: one Disposition Plan: TBD   Consultants:  [facility]    Procedures/Significant Events:  10/14 Echocardiogram:-LVEF 60% to 65%. - Aorta: Poorly visualied - calcified leafelts, possible stenosis. - Right ventricle:  The cavity size was mildly dilated. Mild   hypokinesis. 10/16 transfused 1 unit PRBC 10/19 CorTrak tube placed 10/25 PCXR slight increased density RML. Fluid within the major fissure?    I have personally reviewed and interpreted all radiology studies and my findings are as above.  VENTILATOR SETTINGS:    Cultures 10/25 blood pending 10/25 urine pending    Antimicrobials:Anti-infectives    Start     Stop   08/04/17 1600  piperacillin-tazobactam (ZOSYN) IVPB 3.375 g         08/04/17 1530  piperacillin-tazobactam (ZOSYN) IVPB 3.375 g  Status:  Discontinued     08/04/17 1536   07/26/17 1000  vancomycin (VANCOCIN) IVPB 750 mg/150 ml premix  Status:  Discontinued     07/27/17 1147   07/25/17 2300  piperacillin-tazobactam (ZOSYN) IVPB 3.375 g  Status:  Discontinued     07/27/17 1147   07/25/17 1630  vancomycin (VANCOCIN) IVPB 1000 mg/200 mL premix  Status:  Discontinued     07/26/17 0955   07/25/17 1630  piperacillin-tazobactam (ZOSYN) IVPB 3.375 g     07/25/17 1708       Devices   LINES / TUBES:  CorTrak tube 10/19>>>    Continuous Infusions: . piperacillin-tazobactam (ZOSYN)  IV Stopped (08/06/17 1220)     Objective: Vitals:   08/06/17 0408 08/06/17 0744 08/06/17 1151 08/06/17 1602  BP: (!) 146/85 126/62  Pulse:  91    Resp: 16 20 18  (!) 21  Temp: 97.8 F (36.6 C) 98.6 F (37 C) 99 F (37.2 C) 98.2 F (36.8 C)  TempSrc: Axillary Oral Axillary Axillary  SpO2: 99% 99%    Weight: 139 lb 1.6 oz (63.1 kg)     Height:        Intake/Output Summary (Last 24 hours) at 08/06/17 1638 Last data filed at 08/06/17 1317  Gross per 24 hour  Intake                0 ml  Output              753 ml  Net             -753 ml   Filed Weights   08/04/17 0428 08/05/17 0402 08/06/17 0408  Weight: 143 lb 11.2 oz (65.2 kg) 138 lb 8 oz (62.8 kg) 139 lb 1.6 oz (63.1 kg)    Physical Exam:  General:  A/O 2 ((does not know when, why), cooperative, No acute respiratory  distress ENT: Negative Runny nose, negative gingival bleeding,CorTrack tube present Lungs: Clear to auscultation bilaterally without wheezes or crackles Cardiovascular: Regular rate and rhythm without murmur gallop or rub normal S1 and S2 Abdomen: negative abdominal pain, nondistended, positive soft, bowel sounds, no rebound, no ascites, no appreciable mass Extremities: No significant cyanosis, clubbing, or edema bilateral lower extremities Skin: Negative rashes, lesions, ulcers Psychiatric:  Unable to assess secondary to altered mental status  Central nervous system:  Cranial nerves II through XII intact, tongue/uvula midline, all extremities muscle strength 5/5, sensation intact throughout, positive dysarthria, negative expressive aphasia, negative receptive aphasia.      Data Reviewed: Care during the described time interval was provided by me .  I have reviewed this patient's available data, including medical history, events of note, physical examination, and all test results as part of my evaluation.   CBC:  Recent Labs Lab 08/01/17 0346 08/02/17 0529 08/03/17 0358 08/04/17 0434 08/05/17 0505  WBC 6.7 7.8 18.8* 19.9* 11.8*  HGB 8.3* 7.9* 8.1* 7.8* 8.0*  HCT 27.0* 26.0* 26.6* 25.5* 26.1*  MCV 74.6* 75.8* 77.1* 76.8* 77.4*  PLT 250 230 200 265 659   Basic Metabolic Panel:  Recent Labs Lab 07/31/17 0759 08/01/17 0346 08/02/17 0529 08/03/17 0358 08/04/17 0434 08/05/17 0505 08/05/17 1439  NA 135 134* 134* 133* 134* 137  --   K 3.3* 3.2* 3.5 3.5 3.5 3.1* 3.9  CL 103 103 105 105 105 107  --   CO2 18* 22 21* 21* 22 24  --   GLUCOSE 84 146* 136* 133* 108* 109*  --   BUN <5* <5* 6 10 13 12   --   CREATININE 0.58* 0.47* 0.45* 0.63 0.47* 0.41*  --   CALCIUM 7.5* 7.3* 7.1* 7.2* 7.4* 7.4*  --   MG 1.7 1.8 2.1 1.8  --   --  2.1   GFR: Estimated Creatinine Clearance: 74.5 mL/min (A) (by C-G formula based on SCr of 0.41 mg/dL (L)). Liver Function Tests:  Recent Labs Lab  08/04/17 0434  AST 31  ALT 20  ALKPHOS 144*  BILITOT 1.2  PROT 4.6*  ALBUMIN 1.8*   No results for input(s): LIPASE, AMYLASE in the last 168 hours. No results for input(s): AMMONIA in the last 168 hours. Coagulation Profile: No results for input(s): INR, PROTIME in the last 168 hours. Cardiac Enzymes:  Recent Labs Lab 07/31/17 0759 08/01/17 0346 08/02/17 9357  08/03/17 0358  CKTOTAL 334 208 152 100   BNP (last 3 results) No results for input(s): PROBNP in the last 8760 hours. HbA1C: No results for input(s): HGBA1C in the last 72 hours. CBG:  Recent Labs Lab 08/05/17 1630 08/05/17 2046 08/06/17 0743 08/06/17 1149 08/06/17 1600  GLUCAP 106* 104* 92 88 79   Lipid Profile: No results for input(s): CHOL, HDL, LDLCALC, TRIG, CHOLHDL, LDLDIRECT in the last 72 hours. Thyroid Function Tests: No results for input(s): TSH, T4TOTAL, FREET4, T3FREE, THYROIDAB in the last 72 hours. Anemia Panel: No results for input(s): VITAMINB12, FOLATE, FERRITIN, TIBC, IRON, RETICCTPCT in the last 72 hours. Urine analysis:    Component Value Date/Time   COLORURINE YELLOW 07/25/2017 1440   APPEARANCEUR CLEAR 07/25/2017 1440   LABSPEC 1.010 07/25/2017 1440   PHURINE 6.0 07/25/2017 1440   GLUCOSEU NEGATIVE 07/25/2017 1440   HGBUR LARGE (A) 07/25/2017 1440   BILIRUBINUR NEGATIVE 07/25/2017 1440   KETONESUR 20 (A) 07/25/2017 1440   PROTEINUR 30 (A) 07/25/2017 1440   UROBILINOGEN 0.2 04/30/2014 1350   NITRITE NEGATIVE 07/25/2017 1440   LEUKOCYTESUR NEGATIVE 07/25/2017 1440   Sepsis Labs: @LABRCNTIP (procalcitonin:4,lacticidven:4)  ) No results found for this or any previous visit (from the past 240 hour(s)).       Radiology Studies: Ct Abdomen Wo Contrast  Result Date: 08/06/2017 CLINICAL DATA:  Feeding difficulties, dysphagia, malnutrition, assess for fluoroscopic gastrostomy insertion EXAM: CT ABDOMEN WITHOUT CONTRAST TECHNIQUE: Multidetector CT imaging of the abdomen was  performed following the standard protocol without IV contrast. COMPARISON:  12/25/2008 FINDINGS: Lower chest: Moderate pleural effusions bilaterally. Pleural thickening and pleural calcifications noted on the right. Associated bibasilar atelectasis. Left lower lobe 15 mm spiculated nodule is partially imaged, images 1 through 4 concerning for malignancy. Recommend dedicated chest CT with contrast if able for further evaluation. Hepatobiliary: No biliary dilatation or obstruction. Mild gallbladder distention with slightly hyperdense intraluminal contents may be related to sludge. Trace pericholecystic fluid, nonspecific. Limited assessment of the liver without contrast. Ill-defined indeterminate hypodense mass in the posteromedial right liver roughly measures 5 x 3.5 cm. Additional smaller hypodense lesions in the hepatic dome. Findings are concerning for malignant metastatic process in the liver, incompletely evaluated without contrast. No intrahepatic biliary dilatation. Pancreas: Unremarkable. No pancreatic ductal dilatation or surrounding inflammatory changes. Spleen: Normal in size without focal abnormality. Adrenals/Urinary Tract: Normal adrenal glands. No renal obstruction or hydronephrosis. No hydroureter or proximal obstructing ureteral calculus. Stomach/Bowel: Stomach is decompressed by feeding tube. Feeding tube terminates the proximal duodenum. No significant hiatal hernia. Stomach is in the midline an although decompressed there does appear to be an anterior percutaneous access window for fluoroscopic gastrostomy insertion. Negative for bowel obstruction, significant dilatation or ileus. No fluid collection or abscess. Vascular/Lymphatic: Extensive abdominal aortic atherosclerosis. Negative for aneurysm. No retroperitoneal hemorrhage. Porta hepatis adenopathy noted with short axis measurement of 17 mm, image 17. Other: No abdominal wall hernia.  Body anasarca noted. Musculoskeletal: Degenerative changes  of the spine. No acute osseous finding or compression fracture. IMPRESSION: stomach is collapsed but is within the midline of the abdomen inferior to the costal margin appearing amenable to percutaneous fluoroscopic gastrostomy insertion. Small to moderate bilateral pleural effusions with bibasilar atelectasis. Chronic appearing right pleural thickening with calcification. Partially imaged left lower lobe 15 mm spiculated nodule warrants further imaging. Recommend dedicated chest CT with contrast if able. At least 3 hepatic hypodensities, largest posteriorly measures 5 cm concerning for hepatic lesions, possibly metastatic disease. Porta hepatis mild adenopathy Extensive  atherosclerosis without aneurysm Nonspecific pericholecystic fluid and body anasarca Electronically Signed   By: Jerilynn Mages.  Shick M.D.   On: 08/06/2017 15:43   Dg Chest Port 1 View  Result Date: 08/06/2017 CLINICAL DATA:  Follow-up pneumonia EXAM: PORTABLE CHEST 1 VIEW COMPARISON:  08/03/2017 FINDINGS: Cardiac shadow is stable. Aortic calcifications are again seen. Feeding catheter is noted extending into the stomach. Lungs are well aerated bilaterally. Some improved aeration is noted on the left. Some increased density is noted in the right mid lung which may represent fluid within the major fissure. Patchy interstitial changes are noted throughout the right lung. IMPRESSION: Slight increase in density in the right mid lung which may represent fluid within the major fissure. Electronically Signed   By: Inez Catalina M.D.   On: 08/06/2017 07:56        Scheduled Meds: . aspirin  81 mg Per Tube Daily  . atorvastatin  40 mg Per Tube q1800  . bethanechol  10 mg Oral TID  . chlorhexidine  15 mL Mouth Rinse BID  . clopidogrel  75 mg Per Tube Daily  . mouth rinse  15 mL Mouth Rinse BID  . metoprolol tartrate  12.5 mg Per Tube BID  . tamsulosin  0.4 mg Oral Daily   Continuous Infusions: . piperacillin-tazobactam (ZOSYN)  IV Stopped (08/06/17  1220)     LOS: 12 days    Time spent: 40 minutes    WOODS, Geraldo Docker, MD [facility] Pager [phone]   If 7PM-7AM, please contact night-coverage [email or web address] Password [facility] 08/06/2017, 4:38 PM

## 2017-08-06 NOTE — Consult Note (Signed)
Chief Complaint: Patient was seen in consultation today for dysphagia  Referring Physician(s): Dr. Dia Crawford  Supervising Physician: Sandi Mariscal  Patient Status: [facility] - In-pt  History of Present Illness: David Lucero is a 71 y.o. male with past medical history of anxiety, COPD, ETOH abuse, lung and laryngeal cancer ~10 years ago who was found down at home with inferior MI and rhabdomyolysis.  Patient has remained confused throughout hospitalization, but does have moments of relative lucidity.  Patient has not been able to eat or drink PO despite SLP intervention and repeated evaluation.  He continues with suspected laryngeal dysfunction and SLP has recommend feeding via alternative means.   IR consulted for gastrostomy tube placement at the request of Dr. Sherral Hammers.   Past Medical History:  Diagnosis Date  . Acid reflux    from throat cancer  . Anxiety   . Cancer ([place])   . COPD (chronic obstructive pulmonary disease) ([place])   . ETOH abuse   . H/O ETOH abuse   . Lung cancer ([place])   . PAD (peripheral artery disease) ([place])   . PTSD (post-traumatic stress disorder)     History reviewed. No pertinent surgical history.  Allergies: Patient has no known allergies.  Medications: Prior to Admission medications   Medication Sig Start Date End Date Taking? Authorizing Provider  acetaminophen (TYLENOL) 500 MG tablet Take 1,000 mg by mouth every 6 (six) hours as needed for moderate pain.    Yes [provider]  LORazepam (ATIVAN) 0.5 MG tablet Take 1 tablet (0.5 mg total) by mouth at bedtime. Patient taking differently: Take 0.5 mg by mouth daily as needed for anxiety.  03/18/17  Yes Pollina, Gwenyth Allegra, MD  traZODone (DESYREL) 150 MG tablet Take 150 mg by mouth at bedtime.   Yes [provider]  LORazepam (ATIVAN) 0.5 MG tablet Take 1 tablet (0.5 mg total) by mouth 2 (two) times daily. Patient not taking: Reported on 03/28/2017 01/10/17   Ripley Fraise, MD       Family History  Problem Relation Age of Onset  . Heart attack Father     Social History   Social History  . Marital status: Single    Spouse name: N/A  . Number of children: N/A  . Years of education: N/A   Social History Main Topics  . Smoking status: Current Every Day Smoker    Packs/day: 1.00    Years: 30.00    Types: Cigarettes  . Smokeless tobacco: Never Used  . Alcohol use 50.4 oz/week    84 Cans of beer per week     Comment: 12 or more beers/day  . Drug use: No  . Sexual activity: Not Asked   Other Topics Concern  . None   Social History Narrative  . None    Review of Systems  Unable to perform ROS: Mental status change    Vital Signs: BP 126/62 (BP Location: Right Arm)   Pulse 91   Temp 98.2 F (36.8 C) (Axillary)   Resp (!) 21   Ht 5\' 7"  (1.702 m)   Wt 139 lb 1.6 oz (63.1 kg)   SpO2 99%   BMI 21.79 kg/m   Physical Exam  Constitutional: He appears well-developed.  Cardiovascular: Normal rate and normal heart sounds.   Pulmonary/Chest: Effort normal and breath sounds normal. No respiratory distress.  Abdominal: Soft. There is no tenderness.  Scar/healed tract from suspected prior gastrostomy placement.   Neurological: He is alert.  Skin: Skin  is warm and dry.  Psychiatric:  confused  Nursing note and vitals reviewed.   Imaging: Ct Abdomen Wo Contrast  Result Date: 08/06/2017 CLINICAL DATA:  Feeding difficulties, dysphagia, malnutrition, assess for fluoroscopic gastrostomy insertion EXAM: CT ABDOMEN WITHOUT CONTRAST TECHNIQUE: Multidetector CT imaging of the abdomen was performed following the standard protocol without IV contrast. COMPARISON:  12/25/2008 FINDINGS: Lower chest: Moderate pleural effusions bilaterally. Pleural thickening and pleural calcifications noted on the right. Associated bibasilar atelectasis. Left lower lobe 15 mm spiculated nodule is partially imaged, images 1 through 4 concerning for malignancy. Recommend dedicated  chest CT with contrast if able for further evaluation. Hepatobiliary: No biliary dilatation or obstruction. Mild gallbladder distention with slightly hyperdense intraluminal contents may be related to sludge. Trace pericholecystic fluid, nonspecific. Limited assessment of the liver without contrast. Ill-defined indeterminate hypodense mass in the posteromedial right liver roughly measures 5 x 3.5 cm. Additional smaller hypodense lesions in the hepatic dome. Findings are concerning for malignant metastatic process in the liver, incompletely evaluated without contrast. No intrahepatic biliary dilatation. Pancreas: Unremarkable. No pancreatic ductal dilatation or surrounding inflammatory changes. Spleen: Normal in size without focal abnormality. Adrenals/Urinary Tract: Normal adrenal glands. No renal obstruction or hydronephrosis. No hydroureter or proximal obstructing ureteral calculus. Stomach/Bowel: Stomach is decompressed by feeding tube. Feeding tube terminates the proximal duodenum. No significant hiatal hernia. Stomach is in the midline an although decompressed there does appear to be an anterior percutaneous access window for fluoroscopic gastrostomy insertion. Negative for bowel obstruction, significant dilatation or ileus. No fluid collection or abscess. Vascular/Lymphatic: Extensive abdominal aortic atherosclerosis. Negative for aneurysm. No retroperitoneal hemorrhage. Porta hepatis adenopathy noted with short axis measurement of 17 mm, image 17. Other: No abdominal wall hernia.  Body anasarca noted. Musculoskeletal: Degenerative changes of the spine. No acute osseous finding or compression fracture. IMPRESSION: stomach is collapsed but is within the midline of the abdomen inferior to the costal margin appearing amenable to percutaneous fluoroscopic gastrostomy insertion. Small to moderate bilateral pleural effusions with bibasilar atelectasis. Chronic appearing right pleural thickening with calcification.  Partially imaged left lower lobe 15 mm spiculated nodule warrants further imaging. Recommend dedicated chest CT with contrast if able. At least 3 hepatic hypodensities, largest posteriorly measures 5 cm concerning for hepatic lesions, possibly metastatic disease. Porta hepatis mild adenopathy Extensive atherosclerosis without aneurysm Nonspecific pericholecystic fluid and body anasarca Electronically Signed   By: Jerilynn Mages.  Shick M.D.   On: 08/06/2017 15:43   Ct Head Wo Contrast  Result Date: 07/25/2017 CLINICAL DATA:  Found down, possible STEMI EXAM: CT HEAD WITHOUT CONTRAST TECHNIQUE: Contiguous axial images were obtained from the base of the skull through the vertex without intravenous contrast. COMPARISON:  04/30/2014 FINDINGS: Brain: No evidence of acute infarction, hemorrhage, hydrocephalus, extra-axial collection or mass lesion/mass effect. Subcortical white matter and periventricular small vessel ischemic changes. Chronic right basal ganglia lacunar infarct. Bilateral basal ganglia calcifications. Vascular: Intracranial atherosclerosis. Skull: Normal. Negative for fracture or focal lesion. Sinuses/Orbits: The visualized paranasal sinuses are essentially clear. The mastoid air cells are unopacified. Other: None. IMPRESSION: No evidence of acute intracranial abnormality. Small vessel ischemic changes. Chronic right basal ganglia lacunar infarct. Electronically Signed   By: Julian Hy M.D.   On: 07/25/2017 13:25   Ct Shoulder Left Wo Contrast  Result Date: 07/27/2017 CLINICAL DATA:  Humerus fracture. EXAM: CT OF THE UPPER LEFT EXTREMITY WITHOUT CONTRAST TECHNIQUE: Multidetector CT imaging of the upper left extremity was performed according to the standard protocol. COMPARISON:  Left shoulder x-rays dated  July 25, 2017. FINDINGS: Bones/Joint/Cartilage Again seen is a comminuted fracture of the left humerus surgical neck. The fracture extends into the base of the greater tuberosity. There is  approximately 1 cm of anterior displacement of the distal fragment, and 2-3 cm of overriding fragments. Old healed left clavicle and rib fractures. Mild degenerative changes of the acromioclavicular joint. Ligaments Suboptimally assessed by CT. Muscles and Tendons No focal abnormality. Soft tissues Small amount of hematoma surrounding the fracture, extending into the left axilla. Mild soft tissue edema about the left shoulder. Mild left upper lobe centrilobular and paraseptal emphysema. IMPRESSION: 1. Mildly displaced, comminuted, impacted fracture of the left humerus surgical neck extending into the base of the greater tuberosity. Electronically Signed   By: Titus Dubin M.D.   On: 07/27/2017 14:37   Dg Chest Port 1 View  Result Date: 08/06/2017 CLINICAL DATA:  Follow-up pneumonia EXAM: PORTABLE CHEST 1 VIEW COMPARISON:  08/03/2017 FINDINGS: Cardiac shadow is stable. Aortic calcifications are again seen. Feeding catheter is noted extending into the stomach. Lungs are well aerated bilaterally. Some improved aeration is noted on the left. Some increased density is noted in the right mid lung which may represent fluid within the major fissure. Patchy interstitial changes are noted throughout the right lung. IMPRESSION: Slight increase in density in the right mid lung which may represent fluid within the major fissure. Electronically Signed   By: Inez Catalina M.D.   On: 08/06/2017 07:56   Dg Chest Port 1 View  Result Date: 08/03/2017 CLINICAL DATA:  Abnormal color of sputum Hx of COPD, lung ca, PAD. Smoker x30 years EXAM: PORTABLE CHEST 1 VIEW COMPARISON:  07/30/2017 FINDINGS: Normal cardiac silhouette. Bilateral pleural effusions. Mild central venous congestion. No focal infiltrate. NG tube extends the stomach. IMPRESSION: 1. Bilateral pleural effusions and venous congestion. 2. Feeding tube extends into stomach. Electronically Signed   By: Suzy Bouchard M.D.   On: 08/03/2017 13:38   Dg Chest Port 1  View  Result Date: 07/30/2017 CLINICAL DATA:  Shortness of breath, STEMI, pneumonia. History of [place] OPD, current smoker. EXAM: PORTABLE CHEST 1 VIEW COMPARISON:  Chest x-ray of July 26, 2017 FINDINGS: The lungs remain well-expanded. There is new increased interstitial density at both bases greatest on the right. The hemidiaphragms are partially obscured. There is new increased interstitial density in the left upper lobe as well. The heart is top-normal in size. The pulmonary vascularity is not clearly engorged. There is calcification in the wall of the aortic arch. The pulmonary vascularity is not engorged. IMPRESSION: Interval development of bilateral interstitial and early alveolar infiltrates. Probable small bilateral pleural effusions. No significant pulmonary vascular congestion. Thoracic aortic atherosclerosis. Electronically Signed   By: David  Martinique M.D.   On: 07/30/2017 07:48   Dg Chest Port 1 View  Result Date: 07/26/2017 CLINICAL DATA:  Atelectasis. EXAM: PORTABLE CHEST 1 VIEW COMPARISON:  Radiograph of July 25, 2017. FINDINGS: The heart size and mediastinal contours are within normal limits. Both lungs are clear. No pneumothorax or pleural effusion is noted. Comminuted proximal left humeral fracture is again noted. Old left rib fractures are noted. IMPRESSION: Continued presence of comminuted proximal left humeral fracture. No acute cardiopulmonary abnormality seen. Electronically Signed   By: Marijo Conception, M.D.   On: 07/26/2017 07:10   Dg Chest Port 1 View  Result Date: 07/25/2017 CLINICAL DATA:  Found lying on the floor and hypothermic. EXAM: PORTABLE CHEST 1 VIEW COMPARISON:  08/21/2015 FINDINGS: Cardiomediastinal silhouette is normal. Mediastinal  contours appear intact. Tortuosity and calcific atherosclerotic disease of the aorta. There is no evidence of focal airspace consolidation, pleural effusion or pneumothorax. Osseous structures are without acute abnormality. Soft  tissues are grossly normal. IMPRESSION: No active disease. Electronically Signed   By: Fidela Salisbury M.D.   On: 07/25/2017 12:49   Dg Shoulder Left Portable  Result Date: 07/25/2017 CLINICAL DATA:  Left shoulder pain since a fall today. Initial encounter. EXAM: LEFT SHOULDER - 1 VIEW COMPARISON:  Plain films left humerus this same day. FINDINGS: Comminuted surgical neck fracture of the left humerus is identified. There is fragment override and approximately 1 shaft width anterior displacement the distal fragment. The fracture likely involves the greater tuberosity. Remote healed left clavicle fracture is noted. Imaged left lung and ribs are clear. IMPRESSION: Acute, comminuted fracture of the surgical neck of the left humerus as described. Electronically Signed   By: Inge Rise M.D.   On: 07/25/2017 15:13   Dg Humerus Left  Result Date: 07/25/2017 CLINICAL DATA:  Left upper arm deformity after fall yesterday. EXAM: LEFT HUMERUS - 2+ VIEW COMPARISON:  None. FINDINGS: Moderately displaced and comminuted fracture is seen involving the proximal left humeral head and neck. Old distal left clavicular fracture is noted as well. IMPRESSION: Moderately displaced and comminuted proximal left humeral head and neck fracture is noted. Electronically Signed   By: Marijo Conception, M.D.   On: 07/25/2017 12:51    Labs:  CBC:  Recent Labs  08/02/17 0529 08/03/17 0358 08/04/17 0434 08/05/17 0505  WBC 7.8 18.8* 19.9* 11.8*  HGB 7.9* 8.1* 7.8* 8.0*  HCT 26.0* 26.6* 25.5* 26.1*  PLT 230 200 265 249    COAGS:  Recent Labs  07/25/17 1215  INR 1.29  APTT 33    BMP:  Recent Labs  08/02/17 0529 08/03/17 0358 08/04/17 0434 08/05/17 0505 08/05/17 1439  NA 134* 133* 134* 137  --   K 3.5 3.5 3.5 3.1* 3.9  CL 105 105 105 107  --   CO2 21* 21* 22 24  --   GLUCOSE 136* 133* 108* 109*  --   BUN 6 10 13 12   --   CALCIUM 7.1* 7.2* 7.4* 7.4*  --   CREATININE 0.45* 0.63 0.47* 0.41*  --     GFRNONAA >60 >60 >60 >60  --   GFRAA >60 >60 >60 >60  --     LIVER FUNCTION TESTS:  Recent Labs  07/25/17 1215 07/25/17 1640 07/28/17 0234 08/04/17 0434  BILITOT 0.9 0.8 0.7 1.2  AST 95* 110* 81* 31  ALT 23 22 30 20   ALKPHOS 123 104 112 144*  PROT 5.8* 5.2* 4.6* 4.6*  ALBUMIN 2.7* 2.6* 2.0* 1.8*    TUMOR MARKERS: No results for input(s): AFPTM, CEA, CA199, CHROMGRNA in the last 8760 hours.  Assessment and Plan: Dysphagia Request for gastrostomy placement in patient with confusion, dysphagia, and suspected laryngeal dysfunction.  Patient without family or HCPOA; although he does have long-time friends who have been available at the hospital.  Patient is planning to d/c to SNF and will need long-term plan for nutrition and hydration.   IR consulted for gastrostomy placement. Emergent consent for procedure has reportedly been obtained by ordering MD- not available in chart at time of visit today.   CT Abdomen 08/06/17 shows: Stomach is collapsed but is within the midline of the abdomen inferior to the costal margin appearing amenable to percutaneous fluoroscopic gastrostomy insertion.  Patient is on Plavix.  Discussed with Dr. Sherral Hammers and Dr. Pascal Lux.  Patient to remain on Plavix given recent MI.   Discussed barriers to potential placement tomorrow with RN, however will make patient NPO after midnight. He did receive lovenox this AM, but this has since been canceled.   Thank you for this interesting consult.  I greatly enjoyed meeting David Lucero and look forward to participating in their care.  A copy of this report was sent to the requesting provider on this date.  Electronically Signed: Docia Barrier, PA 08/06/2017, 5:12 PM   I spent a total of 40 Minutes    in face to face in clinical consultation, greater than 50% of which was counseling/coordinating care for dysphagia.

## 2017-08-06 NOTE — Progress Notes (Signed)
Physical Therapy Treatment Patient Details Name: David Lucero MRN: [id number] DOB: 07-19-1946 Today's Date: 08/06/2017    History of Present Illness This 71 y.o. male admitted after being found on floor of home by a friend.  He was found to by hypoterhmic.  TO:IZTIW; x-ray/CT showed Lt humerus fx (nonoperative), SIRS/aspiration PNA, rhabdomyolosis, Acute renal failure.  PMH includes:  COPD, PTSD, anxiety, depression, ETOH abuse, lung CA    PT Comments    Patient progressing slowly towards PT goals. Alert and awake today.Tolerated standing and side stepping along side bed today with Mod A for balance/safety. Continues to be confused but able to converse appropriately and cooperate with all requests. Pt will be ready for gait training next session.  Will follow.   Follow Up Recommendations  SNF;Supervision/Assistance - 24 hour     Equipment Recommendations  Other (comment) (TBD)    Recommendations for Other Services       Precautions / Restrictions Precautions Precautions: Fall Precaution Comments: LUE humerus fxs, rib fxs Required Braces or Orthoses: Sling Restrictions Weight Bearing Restrictions: Yes LUE Weight Bearing: Non weight bearing    Mobility  Bed Mobility Overal bed mobility: Needs Assistance Bed Mobility: Supine to Sit;Sit to Supine     Supine to sit: Mod assist;HOB elevated Sit to supine: Supervision;HOB elevated   General bed mobility comments: Assist to elevate trunk to get to EOB. Able to bring LES into bed to return to supine.  Transfers Overall transfer level: Needs assistance Equipment used: 2 person hand held assist Transfers: Sit to/from Stand Sit to Stand: Mod assist         General transfer comment: Assist to power to standing with posterior lean but able to correct with cues.   Ambulation/Gait Ambulation/Gait assistance: Mod assist Ambulation Distance (Feet): 4 Feet Assistive device: 1 person hand held assist Gait  Pattern/deviations: Step-to pattern Gait velocity: decreased   General Gait Details: Able to side step along side bed with Mod A for balance/support. Fatigues. Declined walking away from bed today.   Stairs            Wheelchair Mobility    Modified Rankin (Stroke Patients Only)       Balance Overall balance assessment: Needs assistance Sitting-balance support: Feet supported;No upper extremity supported Sitting balance-Leahy Scale: Fair Sitting balance - Comments: supervision at EOB   Standing balance support: During functional activity Standing balance-Leahy Scale: Poor Standing balance comment: moderate assist for static and dynamic standing balance.                             Cognition Arousal/Alertness: Awake/alert Behavior During Therapy: WFL for tasks assessed/performed Overall Cognitive Status: Impaired/Different from baseline Area of Impairment: Orientation;Safety/judgement;Following commands;Memory;Problem solving                 Orientation Level: Disoriented to;Time;Situation (December) Current Attention Level: Sustained Memory: Decreased short-term memory;Decreased recall of precautions Following Commands: Follows one step commands with increased time (with repetition) Safety/Judgement: Decreased awareness of deficits;Decreased awareness of safety   Problem Solving: Slow processing;Decreased initiation;Difficulty sequencing;Requires verbal cues;Requires tactile cues General Comments: Pt seems less confused today. Conversive. Able to understand why he has to wear his mitts with explanation. Thinks he had 2 beers yesterday and walked around.      Exercises      General Comments        Pertinent Vitals/Pain Pain Assessment: Faces Faces Pain Scale: Hurts little more Pain Location: grimacing  with movement Pain Descriptors / Indicators: Grimacing;Guarding Pain Intervention(s): Monitored during session;Repositioned;Limited activity  within patient's tolerance    Home Living                      Prior Function            PT Goals (current goals can now be found in the care plan section) Progress towards PT goals: Progressing toward goals    Frequency    Min 2X/week      PT Plan Current plan remains appropriate    Co-evaluation              AM-PAC PT "6 Clicks" Daily Activity  Outcome Measure  Difficulty turning over in bed (including adjusting bedclothes, sheets and blankets)?: Unable Difficulty moving from lying on back to sitting on the side of the bed? : Unable Difficulty sitting down on and standing up from a chair with arms (e.g., wheelchair, bedside commode, etc,.)?: Unable Help needed moving to and from a bed to chair (including a wheelchair)?: A Lot Help needed walking in hospital room?: A Lot Help needed climbing 3-5 steps with a railing? : Total 6 Click Score: 8    End of Session Equipment Utilized During Treatment: Other (comment);Oxygen (sling) Activity Tolerance: Patient limited by fatigue;Patient limited by pain Patient left: in bed;with call bell/phone within reach;with bed alarm set Nurse Communication: Mobility status PT Visit Diagnosis: Other abnormalities of gait and mobility (R26.89);Muscle weakness (generalized) (M62.81);Unsteadiness on feet (R26.81);Pain Pain - Right/Left: Left Pain - part of body: Shoulder     Time: 8676-7209 PT Time Calculation (min) (ACUTE ONLY): 21 min  Charges:  $Therapeutic Activity: 8-22 mins                    G Codes:       Wray Kearns, PT, DPT [phone]     Marguarite Arbour A Nayana Lenig 08/06/2017, 1:38 PM

## 2017-08-06 NOTE — Progress Notes (Signed)
CSW to make referral to [facility] for SNF. Patient also has SNF bed offers under Medicare benefit. CSW spoke to patient's friend [name], who has agreed to help sign patient in to SNF when discharged. CSW to follow up with bed offers and support with [facility] referral.  David Lucero, [place]

## 2017-08-06 NOTE — Progress Notes (Signed)
  Speech Language Pathology Treatment: Dysphagia  Patient Details Name: David Lucero MRN: [id number] DOB: 05/02/1946 Today's Date: 08/06/2017 Time: 0938-1829 SLP Time Calculation (min) (ACUTE ONLY): 9 min  Assessment / Plan / Recommendation Clinical Impression  Pt is more alert and follows commands more consistently, but his swallowing function still appears limited. His voice is audibly wet at baseline, concerning for decreased secretion management. POs were deferred given pending PEG placement, but immediate and delayed coughing is noted during oral care. Coughing is intermittently productive of min-moderate amount of secretions. Pt attempts to swallow on command, but minimal if any hyolaryngeal movement is felt upon palpation. Given the persistent severity of pt's dysphagia as his mentation clears, I worry that there may be some baseline level of dysfunction, particularly given his h/o laryngeal cancer. If had had any prior radiation tx, that cause fibrotic tissue that can impede hyolaryngeal movement. SLP will continue to follow although prognosis for return to POs may be limited.   HPI HPI: 71 year old male patient with multiple comorbidities including: anxiety, alcohol abuse,GERD, COPD, PTSD, PAD, depression, lung cancer, laryngeal cancer, chronic obstructive pulmonary disease. Pt found down by a friend. CXR no acute cardiopulmonary abnormality seen. Imaging of humerus revealed moderately displaced and comminuted proximal left humeral head and neck fracture is noted. Found to have acute inferior ST elevation myocardial infarction, severe lactic acidosis, A-fib, hypokalemia, severe malnutrition. RN reported pt pocketing food.       SLP Plan  Continue with current plan of care       Recommendations  Diet recommendations: NPO Medication Administration: Via alternative means                Oral Care Recommendations: Oral care QID Follow up Recommendations: Skilled Nursing  facility SLP Visit Diagnosis: Dysphagia, oropharyngeal phase (R13.12) Plan: Continue with current plan of care       GO                David Lucero 08/06/2017, 10:55 AM  David Lucero, M.A. CCC-SLP [phone]

## 2017-08-06 NOTE — Progress Notes (Signed)
Bladder scanned and 275 present.

## 2017-08-07 ENCOUNTER — Inpatient Hospital Stay (HOSPITAL_COMMUNITY): Payer: Medicare Other

## 2017-08-07 DIAGNOSIS — R1310 Dysphagia, unspecified: Secondary | ICD-10-CM

## 2017-08-07 HISTORY — PX: IR GASTROSTOMY TUBE MOD SED: IMG625

## 2017-08-07 LAB — GLUCOSE, CAPILLARY
GLUCOSE-CAPILLARY: 99 mg/dL (ref 65–99)
GLUCOSE-CAPILLARY: 88 mg/dL (ref 65–99)
Glucose-Capillary: 92 mg/dL (ref 65–99)
Glucose-Capillary: 83 mg/dL (ref 65–99)
Glucose-Capillary: 120 mg/dL — ABNORMAL HIGH (ref 65–99)

## 2017-08-07 LAB — BASIC METABOLIC PANEL
Anion gap: 10 (ref 5–15)
BUN: 6 mg/dL (ref 6–20)
CALCIUM: 7.5 mg/dL — AB (ref 8.9–10.3)
CO2: 22 mmol/L (ref 22–32)
CREATININE: 0.48 mg/dL — AB (ref 0.61–1.24)
Chloride: 104 mmol/L (ref 101–111)
GFR calc Af Amer: >60 mL/min (ref >60)
Glucose, Bld: 90 mg/dL (ref 65–99)
POTASSIUM: 3 mmol/L — AB (ref 3.5–5.1)
SODIUM: 136 mmol/L (ref 135–145)

## 2017-08-07 LAB — SURGICAL PCR SCREEN
MRSA, PCR: NEGATIVE
Staphylococcus aureus: NEGATIVE

## 2017-08-07 LAB — CBC
HCT: 26.2 % — ABNORMAL LOW (ref 39.0–52.0)
HEMOGLOBIN: 7.9 g/dL — AB (ref 13.0–17.0)
MCH: 23.3 pg — ABNORMAL LOW (ref 26.0–34.0)
MCHC: 30.2 g/dL (ref 30.0–36.0)
MCV: 77.3 fL — ABNORMAL LOW (ref 78.0–100.0)
PLATELETS: 333 10*3/uL (ref 150–400)
RBC: 3.39 MIL/uL — ABNORMAL LOW (ref 4.22–5.81)
RDW: 21.9 % — AB (ref 11.5–15.5)
WBC: 6.9 10*3/uL (ref 4.0–10.5)

## 2017-08-07 LAB — MAGNESIUM: MAGNESIUM: 2 mg/dL (ref 1.7–2.4)

## 2017-08-07 LAB — PROTIME-INR
INR: 1.16
PROTHROMBIN TIME: 14.7 s (ref 11.4–15.2)

## 2017-08-07 MED ORDER — FENTANYL CITRATE (PF) 100 MCG/2ML IJ SOLN
INTRAMUSCULAR | Status: AC
  Filled 2017-08-07: qty 4

## 2017-08-07 MED ORDER — POTASSIUM CHLORIDE 20 MEQ/15ML (10%) PO SOLN
40.0000 meq | Freq: Every day | ORAL | Status: DC
  Administered 2017-08-08: 40 meq
  Filled 2017-08-07: qty 30

## 2017-08-07 MED ORDER — LIDOCAINE HCL 1 % IJ SOLN
INTRAMUSCULAR | Status: AC | PRN
  Administered 2017-08-07: 10 mL

## 2017-08-07 MED ORDER — HYDROCODONE-ACETAMINOPHEN 5-325 MG PO TABS
1.0000 | ORAL_TABLET | ORAL | Status: DC | PRN
Start: 2017-08-07 — End: 2017-08-08

## 2017-08-07 MED ORDER — CEFAZOLIN SODIUM-DEXTROSE 2-4 GM/100ML-% IV SOLN
INTRAVENOUS | Status: AC
  Filled 2017-08-07: qty 100

## 2017-08-07 MED ORDER — LORAZEPAM 0.5 MG PO TABS: 0.5000 mg | ORAL_TABLET | Freq: Three times a day (TID) | ORAL | Status: DC | PRN

## 2017-08-07 MED ORDER — IOPAMIDOL (ISOVUE-300) INJECTION 61%
INTRAVENOUS | Status: AC
  Administered 2017-08-07: 20 mL
  Filled 2017-08-07: qty 50

## 2017-08-07 MED ORDER — ENOXAPARIN SODIUM 40 MG/0.4ML ~~LOC~~ SOLN
40.0000 mg | SUBCUTANEOUS | Status: DC
  Administered 2017-08-08 – 2017-08-10 (×3): 40 mg via SUBCUTANEOUS
  Filled 2017-08-07 (×3): qty 0.4

## 2017-08-07 MED ORDER — LORAZEPAM 2 MG/ML IJ SOLN
0.5000 mg | Freq: Three times a day (TID) | INTRAMUSCULAR | Status: DC | PRN
  Administered 2017-08-07: 0.5 mg via INTRAVENOUS
  Filled 2017-08-07: qty 1

## 2017-08-07 MED ORDER — FENTANYL CITRATE (PF) 100 MCG/2ML IJ SOLN
INTRAMUSCULAR | Status: AC | PRN
  Administered 2017-08-07 (×2): 50 ug via INTRAVENOUS

## 2017-08-07 MED ORDER — LIDOCAINE HCL 1 % IJ SOLN
INTRAMUSCULAR | Status: AC
  Filled 2017-08-07: qty 20

## 2017-08-07 MED ORDER — HYDROMORPHONE HCL 1 MG/ML IJ SOLN
1.0000 mg | INTRAMUSCULAR | Status: DC | PRN
Start: 2017-08-07 — End: 2017-08-08

## 2017-08-07 MED ORDER — ONDANSETRON HCL 4 MG/2ML IJ SOLN: 4.0000 mg | INTRAMUSCULAR | Status: DC | PRN

## 2017-08-07 MED ORDER — LORAZEPAM 0.5 MG PO TABS
0.5000 mg | ORAL_TABLET | Freq: Three times a day (TID) | ORAL | Status: DC | PRN
  Administered 2017-08-08 – 2017-08-10 (×3): 0.5 mg
  Filled 2017-08-07 (×3): qty 1

## 2017-08-07 MED ORDER — CEFAZOLIN SODIUM-DEXTROSE 2-4 GM/100ML-% IV SOLN
2.0000 g | Freq: Once | INTRAVENOUS | Status: AC
  Administered 2017-08-07: 2 g via INTRAVENOUS
  Filled 2017-08-07: qty 100

## 2017-08-07 MED ORDER — GLUCAGON HCL RDNA (DIAGNOSTIC) 1 MG IJ SOLR
INTRAMUSCULAR | Status: AC
  Administered 2017-08-07: 1 mg
  Filled 2017-08-07: qty 1

## 2017-08-07 MED ORDER — MIDAZOLAM HCL 2 MG/2ML IJ SOLN
INTRAMUSCULAR | Status: AC
  Filled 2017-08-07: qty 4

## 2017-08-07 MED ORDER — MIDAZOLAM HCL 2 MG/2ML IJ SOLN
INTRAMUSCULAR | Status: AC | PRN
  Administered 2017-08-07 (×2): 1 mg via INTRAVENOUS

## 2017-08-07 NOTE — Progress Notes (Signed)
Occupational Therapy Treatment Patient Details Name: David Lucero MRN: [id number] DOB: 03/12/1946 Today's Date: 08/07/2017    History of present illness This 71 y.o. male admitted after being found on floor of home by a friend.  He was found to by hypoterhmic.  OA:CZYSA; x-ray/CT showed Lt humerus fx (nonoperative), SIRS/aspiration PNA, rhabdomyolosis, Acute renal failure.  PMH includes:  COPD, PTSD, anxiety, depression, ETOH abuse, lung CA   OT comments  This 71 yo male admitted with above presents to acute OT with making progress toward toileting goal and bed mobility as well as increasing standing balance. He will benefit from continued OT with follow up OT at SNF to work back towards an independent level.  Follow Up Recommendations  SNF;Supervision/Assistance - 24 hour    Equipment Recommendations  Other (comment) (TBD at next venue)       Precautions / Restrictions Precautions Precautions: Fall Precaution Comments: LUE humerus fxs, Left rib fxs Required Braces or Orthoses: Sling (to be worn at all times, shoulder immoblizer) Restrictions Weight Bearing Restrictions: Yes LUE Weight Bearing: Non weight bearing       Mobility Bed Mobility Overal bed mobility: Needs Assistance Bed Mobility: Supine to Sit     Supine to sit: Min assist;HOB elevated     General bed mobility comments: Min guard A to scoot to EOB using RUE  Transfers Overall transfer level: Needs assistance Equipment used: 1 person hand held assist Transfers: Sit to/from [facility] Sit to Stand: Min assist Stand pivot transfers: Min assist            Balance Overall balance assessment: Needs assistance Sitting-balance support: No upper extremity supported;Feet supported Sitting balance-Leahy Scale: Fair     Standing balance support: Single extremity supported;During functional activity Standing balance-Leahy Scale: Poor Standing balance comment: min A for static standing  with single UE support as well                           ADL either performed or assessed with clinical judgement   ADL Overall ADL's : Needs assistance/impaired                         Toilet Transfer: Minimal assistance;Stand-pivot;BSC   Toileting- Clothing Manipulation and Hygiene: Total assistance Toileting - Clothing Manipulation Details (indicate cue type and reason): min A sit<>stand and for standing (holding onto raised bed rail)        repositioned sling immobilizer to support his LUE better.     Vision Patient Visual Report: No change from baseline            Cognition Arousal/Alertness: Awake/alert Behavior During Therapy: WFL for tasks assessed/performed Overall Cognitive Status: Impaired/Different from baseline Area of Impairment: Safety/judgement                         Safety/Judgement: Decreased awareness of safety;Decreased awareness of deficits                         Pertinent Vitals/ Pain       Pain Assessment: Faces Faces Pain Scale: Hurts a little bit Pain Location: grimacing with movement--LUE with repositioning sling Pain Descriptors / Indicators: Grimacing;Moaning Pain Intervention(s): Monitored during session;Repositioned         Frequency  Min 2X/week        Progress Toward Goals  OT Goals(current goals can now  be found in the care plan section)  Progress towards OT goals: Progressing toward goals     Plan Discharge plan remains appropriate       AM-PAC PT "6 Clicks" Daily Activity     Outcome Measure   Help from another person eating meals?: A Little Help from another person taking care of personal grooming?: A Lot Help from another person toileting, which includes using toliet, bedpan, or urinal?: A Lot Help from another person bathing (including washing, rinsing, drying)?: A Little Help from another person to put on and taking off regular upper body clothing?: Total Help from  another person to put on and taking off regular lower body clothing?: Total 6 Click Score: 12    End of Session Equipment Utilized During Treatment: Other (comment) (sling)  OT Visit Diagnosis: Unsteadiness on feet (R26.81);Other abnormalities of gait and mobility (R26.89);Pain Pain - Right/Left: Left Pain - part of body: Arm   Activity Tolerance Patient tolerated treatment well   Patient Left in chair;with call bell/phone within reach;with chair alarm set;with family/visitor present   Nurse Communication Mobility status        Time: 9924-2683 OT Time Calculation (min): 37 min  Charges: OT General Charges $OT Visit: 1 Visit OT Treatments $Self Care/Home Management : 23-37 mins  [place], OTR/L [phone] 08/07/2017

## 2017-08-07 NOTE — Progress Notes (Signed)
[facility] TEAM 1 - Stepdown/ICU TEAM  David Lucero  CWC:[id number] DOB: [birth year] DOA: 07/25/2017 PCP: [name], MD    Brief Narrative:  71yo M with hx of anxiety, alcohol abuse, depression, lung cancer, and COPD for whom EMS was called the a.m. of 10/13 after the patient was found lying on the floor of his cold home by a friend. He had last been seen normal on 10/12 around noon. EMS noted ST elevation with left upper extremity trauma. He was found to be hypothermic and tremulous.  In the ED he complained of left arm pain, denied chest discomfort or shortness of breath. 12-lead showed atrial fibrillation, ST depression in V1 through V3, also concerns of nonspecific intraventricular conduction delay.  His initial temperature was 87.17F, he was bradycardic, and had rapid respiratory rate. His initial lactic acid was 16, hemoglobin 8. A chest x-ray demonstrated fracture of the proximal left humerus. His troponin was mildly elevated.  He was treated with warmed IV fluids, arm was immobilized, seen by Dr. Ellyn Hack who felt EKG was consistent with inferior MI. He recommended IV heparin, and supportive care for medical issues prior to further cardiac intervention. Given his degree of metabolic disarray critical care was asked to admit.  Significant Events: 10/13 - found down - admit to [facility] 10/14 TTE - EF 60-65% - poor quality - ?AoS   Subjective: Patient is alert and interactive though confused.  He is quite pleasant.  He denies chest pain shortness breath fevers or chills.  I have explained to him what is involved and the placement of a PEG tube and the reason that we are wishing to place this tube.  I've explained him that it would allow [place] to remove his NG tube.  I have explained the risks to him and he tells me "whenever you guys think his best I will do."  Though cognitively he is impaired I do feel that he understands what I'm telling him.  Regardless I make the third physician, one  of whom is not directly involved in his care, who agrees that PEG tube is most appropriate for him.  I have therefore signed the consent on his behalf.  Assessment & Plan:  Acute hypoxic resp failure - transient aspiration pneumonitis  Suspect the patient suffered an episode of aspiration of NG feeding 10/22 - immediate CXR was w/o acute findings - the patient has improved markedly - he has been weaned from supplemental oxygen - I will discontinue antibiotics after 5 days of treatment  Acute inferior ST elevation myocardial infarction marked inferior ST elevation improved with hydration and warming - Cardiology has signed off - medical management only - TTE w/o evidence of signif LV impairment   Parox Afib Maintaining sinus rhythm at this time  Rhabdomyolysis  Resolved w/ volume resuscitation   Bradycardia Resolved   Severe lactic acidosis resolved   Acute kidney injury renal function has normalized w/ volume resuscitation   Acute urinary retention Urinary retention persists with 2 failed attempts at Foley discontinuation despite use of Flomax and urecholine - follow urine output closely  Hypokalemia likley has signif total body deficit due to poor nutrition - cont to supplement   Severe malnutrition in setting of alcohol abuse S/p cortrak 10/19 - SLP re-eval 10/23 continued to suggest NPO - PEG tube to be placed today w/ agreement of Dr. Sherral Hammers, Dr. Thereasa Solo, and impartial Dr. Lake Bells agreeing this is most appropriate   Microcytic anemia Fe studies c/w severe malnutrition associated  anemia - Hgb holding steady for now   History of laryngeal and lung cancer records not currently available - reportedly treated 10 years ago     Hyperglycemia A1c not c/w DM   Dementia complicated by Acute delirium - History of alcohol abuse / EtOH withdrawal CIWA protocol has been completed - hx from neighbors/friends suggest a baseline of cognitive decline, perhaps due to EtOH, or possibly  dementia of other cause - at present his delirium appears to have resolved with his baseline dementia persisting  Left proximal humerus fracture Keep immobilized - Ortho reports wound is amenable to non-operative tx - non-weightbearing w/ continued use of sling   Goals of Care Pt has no family available to speak on his behalf - given his overall very poor state I do not feel that a code or use of mechanical ventilation would be appropriate - PCCM has seen the pt and agrees - the pt himself has also clearly stated he would not want life support or CPR - pt is now NCB/DNR  DVT prophylaxis: lovenox   Code Status: DNR - NO CODE Family Communication: no family present at time of exam  Disposition Plan: SDU  Consultants:  PCCM Orthopedics Cardiology   Antimicrobials:  Zosyn 10/13 > 10/15 + 10/23 > Vanc 10/13 > 10/15  Objective: Blood pressure (!) 110/57, pulse 79, temperature 98 F (36.7 C), temperature source Oral, resp. rate 20, height 5\' 7"  (1.702 m), weight 63.8 kg (140 lb 9.6 oz), SpO2 96 %.  Intake/Output Summary (Last 24 hours) at 08/07/17 1448 Last data filed at 08/07/17 0430  Gross per 24 hour  Intake              750 ml  Output              500 ml  Net              250 ml   Filed Weights   08/05/17 0402 08/06/17 0408 08/07/17 0338  Weight: 62.8 kg (138 lb 8 oz) 63.1 kg (139 lb 1.6 oz) 63.8 kg (140 lb 9.6 oz)    Examination: General: No acute respiratory distress - alert and conversant Lungs: Clear to auscultation throughout Cardiovascular: RRR without murmur Abdomen: NT/ND, soft, bowel sounds positive, no rebound Extremities: No C/C/E B LE   CBC:  Recent Labs Lab 08/02/17 0529 08/03/17 0358 08/04/17 0434 08/05/17 0505 08/07/17 0555  WBC 7.8 18.8* 19.9* 11.8* 6.9  HGB 7.9* 8.1* 7.8* 8.0* 7.9*  HCT 26.0* 26.6* 25.5* 26.1* 26.2*  MCV 75.8* 77.1* 76.8* 77.4* 77.3*  PLT 230 200 265 249 335   Basic Metabolic Panel:  Recent Labs Lab 08/01/17 0346  08/02/17 0529 08/03/17 0358 08/04/17 0434 08/05/17 0505 08/05/17 1439 08/07/17 0555  NA 134* 134* 133* 134* 137  --  136  K 3.2* 3.5 3.5 3.5 3.1* 3.9 3.0*  CL 103 105 105 105 107  --  104  CO2 22 21* 21* 22 24  --  22  GLUCOSE 146* 136* 133* 108* 109*  --  90  BUN <5* 6 10 13 12   --  6  CREATININE 0.47* 0.45* 0.63 0.47* 0.41*  --  0.48*  CALCIUM 7.3* 7.1* 7.2* 7.4* 7.4*  --  7.5*  MG 1.8 2.1 1.8  --   --  2.1 2.0   GFR: Estimated Creatinine Clearance: 75.3 mL/min (A) (by C-G formula based on SCr of 0.48 mg/dL (L)).  Liver Function Tests:  Recent Labs Lab 08/04/17 0434  AST 31  ALT 20  ALKPHOS 144*  BILITOT 1.2  PROT 4.6*  ALBUMIN 1.8*   Cardiac Enzymes:  Recent Labs Lab 08/01/17 0346 08/02/17 0529 08/03/17 0358  CKTOTAL 208 152 100    HbA1C: Hgb A1c MFr Bld  Date/Time Value Ref Range Status  07/28/2017 02:35 AM 5.5 4.8 - 5.6 % Final    Comment:    (NOTE) Pre diabetes:          5.7%-6.4% Diabetes:              >6.4% Glycemic control for   <7.0% adults with diabetes       Recent Results (from the past 240 hour(s))  Surgical PCR screen     Status: None   Collection Time: 08/06/17 11:23 PM  Result Value Ref Range Status   MRSA, PCR NEGATIVE NEGATIVE Final   Staphylococcus aureus NEGATIVE NEGATIVE Final    Comment: (NOTE) The Xpert SA Assay (FDA approved for NASAL specimens in patients 21 years of age and older), is one component of a comprehensive surveillance program. It is not intended to diagnose infection nor to guide or monitor treatment.      Scheduled Meds: . aspirin  81 mg Per Tube Daily  . atorvastatin  40 mg Per Tube q1800  . bethanechol  10 mg Oral TID  . chlorhexidine  15 mL Mouth Rinse BID  . clopidogrel  75 mg Per Tube Daily  . feeding supplement (JEVITY 1.2 CAL)  1,000 mL Per Tube Daily  . mouth rinse  15 mL Mouth Rinse BID  . metoprolol tartrate  12.5 mg Per Tube BID  . tamsulosin  0.4 mg Oral Daily     LOS: 13 days    Cherene Altes, MD [facility] Office  [phone] Pager - Text Page per Shea Evans as per below:  On-Call/Text Page:      [email or web address]      password [email or web address]  If 7PM-7AM, please contact night-coverage [email or web address] Password [email or web address] 08/07/2017, 2:48 PM

## 2017-08-07 NOTE — Progress Notes (Signed)
Paged MD [facility] about consent for peg tube awaiting call back. I will continue to monitor the patient closely.   Saddie Benders RN

## 2017-08-07 NOTE — Procedures (Signed)
  Procedure: Perc gastrostomy tube   Preprocedure diagnosis: FTT Postprocedure diagnosis: same EBL:   minimal Complications:  none immediate  See full dictation in [facility].  Dillard Cannon MD Main # [phone] Pager  [phone]

## 2017-08-07 NOTE — Progress Notes (Signed)
MEDICATION RELATED CONSULT NOTE - INITIAL   Pharmacy Consult for post IR procedure anticoag  No Known Allergies  Patient Measurements: Height: 5\' 7"  (170.2 cm) Weight: 140 lb 9.6 oz (63.8 kg) IBW/kg (Calculated) : 66.1  Assessment: 71 yo M started on heparin for ACS / Afib. Cards has seen and has decided not to use long-term anticoag. Heparin gtt was stopped and they were started on Lovenox for VTE prophylaxis. Now s/p IR procedure to restart anticoag. Hgb low but stable at 7.9, plts wnl.  Plan:  Restart enoxaparin 40mg  [place] Q24h tomorrow morning Monitor CBC, s/s of bleed  Elenor Quinones, PharmD, BCPS Clinical Pharmacist Pager [phone] 08/07/2017 5:20 PM

## 2017-08-07 NOTE — Progress Notes (Addendum)
Paged MD [facility] about consent for peg tube awaiting a call back. Reported to current RN about about need for consent and communication with Pam in IR.  Saddie Benders RN

## 2017-08-08 ENCOUNTER — Encounter (HOSPITAL_COMMUNITY): Payer: Self-pay | Admitting: Interventional Radiology

## 2017-08-08 LAB — BASIC METABOLIC PANEL
ANION GAP: 12 (ref 5–15)
BUN: 5 mg/dL — ABNORMAL LOW (ref 6–20)
CHLORIDE: 104 mmol/L (ref 101–111)
CO2: 20 mmol/L — AB (ref 22–32)
Calcium: 7.2 mg/dL — ABNORMAL LOW (ref 8.9–10.3)
Creatinine, Ser: 0.57 mg/dL — ABNORMAL LOW (ref 0.61–1.24)
GFR calc non Af Amer: >60 mL/min (ref >60)
Glucose, Bld: 81 mg/dL (ref 65–99)
POTASSIUM: 2.7 mmol/L — AB (ref 3.5–5.1)
Sodium: 136 mmol/L (ref 135–145)

## 2017-08-08 LAB — GLUCOSE, CAPILLARY
GLUCOSE-CAPILLARY: 89 mg/dL (ref 65–99)
Glucose-Capillary: 81 mg/dL (ref 65–99)
Glucose-Capillary: 95 mg/dL (ref 65–99)
Glucose-Capillary: 85 mg/dL (ref 65–99)

## 2017-08-08 LAB — CBC
HCT: 26.7 % — ABNORMAL LOW (ref 39.0–52.0)
HEMOGLOBIN: 7.8 g/dL — AB (ref 13.0–17.0)
MCH: 23 pg — ABNORMAL LOW (ref 26.0–34.0)
MCHC: 29.2 g/dL — ABNORMAL LOW (ref 30.0–36.0)
MCV: 78.8 fL (ref 78.0–100.0)
Platelets: 377 10*3/uL (ref 150–400)
RBC: 3.39 MIL/uL — AB (ref 4.22–5.81)
RDW: 22.3 % — ABNORMAL HIGH (ref 11.5–15.5)
WBC: 8 10*3/uL (ref 4.0–10.5)

## 2017-08-08 LAB — URINE CULTURE: Culture: NO GROWTH

## 2017-08-08 MED ORDER — POTASSIUM CHLORIDE 20 MEQ/15ML (10%) PO SOLN
40.0000 meq | ORAL | Status: AC
  Administered 2017-08-08 (×2): 40 meq via ORAL
  Filled 2017-08-08 (×2): qty 30

## 2017-08-08 MED ORDER — HYDROCODONE-ACETAMINOPHEN 5-325 MG PO TABS: 1.0000 | ORAL_TABLET | ORAL | Status: DC | PRN

## 2017-08-08 MED ORDER — JEVITY 1.2 CAL PO LIQD
1000.0000 mL | Freq: Every day | ORAL | Status: DC
  Filled 2017-08-08: qty 1000

## 2017-08-08 MED ORDER — POTASSIUM CHLORIDE 20 MEQ/15ML (10%) PO SOLN
40.0000 meq | Freq: Three times a day (TID) | ORAL | Status: DC
  Administered 2017-08-08 – 2017-08-09 (×4): 40 meq
  Filled 2017-08-08 (×3): qty 30

## 2017-08-08 MED ORDER — JEVITY 1.2 CAL PO LIQD
1000.0000 mL | ORAL | Status: DC
  Administered 2017-08-08: 1000 mL
  Filled 2017-08-08 (×5): qty 1000

## 2017-08-08 MED ORDER — OXYCODONE HCL 5 MG/5ML PO SOLN
5.0000 mg | ORAL | Status: DC | PRN
  Administered 2017-08-09: 7.5 mg
  Administered 2017-08-10: 5 mg
  Filled 2017-08-08: qty 5
  Filled 2017-08-08: qty 10

## 2017-08-08 NOTE — Plan of Care (Signed)
Problem: Nutrition: Goal: Adequate nutrition will be maintained Outcome: Progressing Tube feeding restarted via PEG; currently infusing at 10 ml/hr, to I increase by 10 every 4 hour with goal 1ml/hr.

## 2017-08-08 NOTE — Progress Notes (Signed)
[place] TEAM 1 - Stepdown/ICU TEAM  David Lucero  JKK:[id number] DOB: [birth year] DOA: 07/25/2017 PCP: [name], MD    Brief Narrative:  71yo M with hx of anxiety, alcohol abuse, depression, lung cancer, and COPD for whom EMS was called the a.m. of 10/13 after the patient was found lying on the floor of his cold home by a friend. He had last been seen normal on 10/12 around noon. EMS noted ST elevation with left upper extremity trauma. He was found to be hypothermic and tremulous.  In the ED he complained of left arm pain, denied chest discomfort or shortness of breath. 12-lead showed atrial fibrillation, ST depression in V1 through V3, also concerns of nonspecific intraventricular conduction delay.  His initial temperature was 87.13F, he was bradycardic, and had rapid respiratory rate. His initial lactic acid was 16, hemoglobin 8. A chest x-ray demonstrated fracture of the proximal left humerus. His troponin was mildly elevated.  He was treated with warmed IV fluids, arm was immobilized, seen by Dr. Ellyn Hack who felt EKG was consistent with inferior MI. He recommended IV heparin, and supportive care for medical issues prior to further cardiac intervention. Given his degree of metabolic disarray critical care was asked to admit.  Significant Events: 10/13 - found down - admit to [facility] 10/14 TTE - EF 60-65% - poor quality - ?AoS   Subjective: Patient is alert and conversant.  He is able to recount his 90 years of [employer] service in significant detail telling me that he received a medical discharge due to PTSD.  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  He denies shortness of breath fevers chills nausea vomiting or abdominal pain.  He is aggravated by the right hand mitten that has been placed on him today to protect his IV and PEG tube.  Assessment & Plan:  Acute hypoxic resp failure - transient aspiration pneumonitis  Suspect the  patient suffered an episode of aspiration of NG feeding 10/22 - immediate CXR was w/o acute findings - the patient has improved markedly - he has been weaned from supplemental oxygen - I will discontinue antibiotics after 5 days of treatment (today)  Acute inferior ST elevation myocardial infarction marked inferior ST elevation improved with hydration and warming - Cardiology has signed off - medical management only - TTE w/o evidence of signif LV impairment   Parox Afib Maintaining sinus rhythm at this time - not an appropriate candidate for anticoagulation given exceedingly high fall risk  Rhabdomyolysis  Resolved w/ volume resuscitation   Bradycardia Resolved   Severe lactic acidosis resolved   Acute kidney injury renal function has normalized w/ volume resuscitation   Acute urinary retention Urinary retention persists with 2 failed attempts at Foley discontinuation despite use of Flomax and urecholine - follow urine output closely  Hypokalemia likley has signif total body deficit due to poor nutrition - cont to supplement - magnesium is okay  Severe malnutrition in setting of alcohol abuse S/p cortrak 10/19 - SLP re-eval 10/23 continued to suggest NPO - PEG tube placed 10/26 w/ Dr. Sherral Hammers, Dr. Thereasa Solo, and impartial Dr. Lake Bells agreeing this is most appropriate - begin using tube feeds for nutrition today  Microcytic anemia Fe studies c/w severe malnutrition associated anemia - Hgb holding steady for now   History of laryngeal and lung cancer records not currently available - reportedly treated 10 years ago     Hyperglycemia A1c not  c/w DM - follow CBGs on tube feeding  Dementia complicated by Acute delirium - History of alcohol abuse / EtOH withdrawal CIWA protocol has been completed - hx from neighbors/friends suggest a baseline of cognitive decline, perhaps due to EtOH, or possibly dementia of other cause - at present his delirium appears to have resolved with his  baseline dementia persisting  Left proximal humerus fracture Keep immobilized - Ortho reports wound is amenable to non-operative tx - non-weightbearing w/ continued use of sling   Goals of Care Pt has no family available to speak on his behalf - given his overall very poor state I do not feel that a code or use of mechanical ventilation would be appropriate - PCCM has seen the pt and agrees - the pt himself has also clearly stated he would not want life support or CPR - pt is now NCB/DNR - I have discussed placement within a skilled nursing facility with the patient and presently he is agreeable to this   DVT prophylaxis: lovenox   Code Status: DNR - NO CODE Family Communication: no family present at time of exam  Disposition Plan: SNF placement this week once stable on PEG feeding regimen  Consultants:  PCCM Orthopedics Cardiology   Antimicrobials:  Zosyn 10/13 > 10/15 + 10/23 > 10/27 Vanc 10/13 > 10/15  Objective: Blood pressure 117/83, pulse (!) 47, temperature 97.8 F (36.6 C), temperature source Oral, resp. rate 16, height 5\' 7"  (1.702 m), weight 62.7 kg (138 lb 4.8 oz), SpO2 94 %.  Intake/Output Summary (Last 24 hours) at 08/08/17 1321 Last data filed at 08/08/17 1003  Gross per 24 hour  Intake           268.67 ml  Output              550 ml  Net          -281.33 ml   Filed Weights   08/06/17 0408 08/07/17 0338 08/08/17 0446  Weight: 63.1 kg (139 lb 1.6 oz) 63.8 kg (140 lb 9.6 oz) 62.7 kg (138 lb 4.8 oz)    Examination: General: No acute respiratory distress - alert/conversant Lungs: CTA - no wheezing - no crackles  Cardiovascular: RRR- no M or R or gallup  Abdomen: NT/ND, soft, bowel sounds positive, no rebound, PEG insertion clean and dry  Extremities: No significant edema bilateral lower extremities  CBC:  Recent Labs Lab 08/03/17 0358 08/04/17 0434 08/05/17 0505 08/07/17 0555 08/08/17 0416  WBC 18.8* 19.9* 11.8* 6.9 8.0  HGB 8.1* 7.8* 8.0* 7.9* 7.8*    HCT 26.6* 25.5* 26.1* 26.2* 26.7*  MCV 77.1* 76.8* 77.4* 77.3* 78.8  PLT 200 265 249 333 427   Basic Metabolic Panel:  Recent Labs Lab 08/02/17 0529 08/03/17 0358 08/04/17 0434 08/05/17 0505 08/05/17 1439 08/07/17 0555 08/08/17 0416  NA 134* 133* 134* 137  --  136 136  K 3.5 3.5 3.5 3.1* 3.9 3.0* 2.7*  CL 105 105 105 107  --  104 104  CO2 21* 21* 22 24  --  22 20*  GLUCOSE 136* 133* 108* 109*  --  90 81  BUN 6 10 13 12   --  6 5*  CREATININE 0.45* 0.63 0.47* 0.41*  --  0.48* 0.57*  CALCIUM 7.1* 7.2* 7.4* 7.4*  --  7.5* 7.2*  MG 2.1 1.8  --   --  2.1 2.0  --    GFR: Estimated Creatinine Clearance: 74 mL/min (A) (by C-G formula based on SCr  of 0.57 mg/dL (L)).  Liver Function Tests:  Recent Labs Lab 08/04/17 0434  AST 31  ALT 20  ALKPHOS 144*  BILITOT 1.2  PROT 4.6*  ALBUMIN 1.8*   Cardiac Enzymes:  Recent Labs Lab 08/02/17 0529 08/03/17 0358  CKTOTAL 152 100    HbA1C: Hgb A1c MFr Bld  Date/Time Value Ref Range Status  07/28/2017 02:35 AM 5.5 4.8 - 5.6 % Final    Comment:    (NOTE) Pre diabetes:          5.7%-6.4% Diabetes:              >6.4% Glycemic control for   <7.0% adults with diabetes       Recent Results (from the past 240 hour(s))  Surgical PCR screen     Status: None   Collection Time: 08/06/17 11:23 PM  Result Value Ref Range Status   MRSA, PCR NEGATIVE NEGATIVE Final   Staphylococcus aureus NEGATIVE NEGATIVE Final    Comment: (NOTE) The Xpert SA Assay (FDA approved for NASAL specimens in patients 9 years of age and older), is one component of a comprehensive surveillance program. It is not intended to diagnose infection nor to guide or monitor treatment.   Culture, blood (routine x 2)     Status: None (Preliminary result)   Collection Time: 08/07/17 12:18 AM  Result Value Ref Range Status   Specimen Description BLOOD LEFT ANTECUBITAL  Final   Special Requests IN PEDIATRIC BOTTLE Blood Culture adequate volume  Final   Culture  NO GROWTH 1 DAY  Final   Report Status PENDING  Incomplete  Culture, blood (routine x 2)     Status: None (Preliminary result)   Collection Time: 08/07/17  2:15 AM  Result Value Ref Range Status   Specimen Description BLOOD LEFT HAND  Final   Special Requests IN PEDIATRIC BOTTLE Blood Culture adequate volume  Final   Culture NO GROWTH 1 DAY  Final   Report Status PENDING  Incomplete  Urine Culture     Status: None   Collection Time: 08/07/17  4:41 AM  Result Value Ref Range Status   Specimen Description IN/OUT CATH URINE  Final   Special Requests NONE  Final   Culture NO GROWTH  Final   Report Status 08/08/2017 FINAL  Final     Scheduled Meds: . aspirin  81 mg Per Tube Daily  . atorvastatin  40 mg Per Tube q1800  . chlorhexidine  15 mL Mouth Rinse BID  . clopidogrel  75 mg Per Tube Daily  . enoxaparin (LOVENOX) injection  40 mg Subcutaneous Q24H  . feeding supplement (JEVITY 1.2 CAL)  1,000 mL Per Tube Daily  . mouth rinse  15 mL Mouth Rinse BID  . metoprolol tartrate  12.5 mg Per Tube BID  . potassium chloride  40 mEq Per Tube Daily  . tamsulosin  0.4 mg Oral Daily     LOS: 14 days   Cherene Altes, MD [facility] Office  [phone] Pager - Text Page per Amion as per below:  On-Call/Text Page:      [email or web address]      password [email or web address]  If 7PM-7AM, please contact night-coverage [email or web address] Password [email or web address] 08/08/2017, 1:21 PM

## 2017-08-08 NOTE — Progress Notes (Signed)
RN notified potassium critical value 2.7. MD Lynch notified. Will continue to monitor patient closely.

## 2017-08-08 NOTE — Plan of Care (Signed)
Problem: Education: Goal: Knowledge of  [facility] Education information/materials will improve Outcome: Progressing Patient educated on medications and plan of care. Requires reinforcement of education

## 2017-08-09 LAB — BASIC METABOLIC PANEL
Anion gap: 6 (ref 5–15)
CALCIUM: 7.6 mg/dL — AB (ref 8.9–10.3)
CHLORIDE: 110 mmol/L (ref 101–111)
CO2: 21 mmol/L — AB (ref 22–32)
CREATININE: 0.47 mg/dL — AB (ref 0.61–1.24)
GFR calc non Af Amer: >60 mL/min (ref >60)
Glucose, Bld: 107 mg/dL — ABNORMAL HIGH (ref 65–99)
Potassium: 4.4 mmol/L (ref 3.5–5.1)
Sodium: 137 mmol/L (ref 135–145)

## 2017-08-09 LAB — CBC
HEMATOCRIT: 26.7 % — AB (ref 39.0–52.0)
Hemoglobin: 7.9 g/dL — ABNORMAL LOW (ref 13.0–17.0)
MCH: 23.4 pg — AB (ref 26.0–34.0)
MCHC: 29.6 g/dL — AB (ref 30.0–36.0)
MCV: 79 fL (ref 78.0–100.0)
PLATELETS: 402 10*3/uL — AB (ref 150–400)
RBC: 3.38 MIL/uL — ABNORMAL LOW (ref 4.22–5.81)
RDW: 23.2 % — AB (ref 11.5–15.5)
WBC: 5.9 10*3/uL (ref 4.0–10.5)

## 2017-08-09 LAB — MAGNESIUM: Magnesium: 2.3 mg/dL (ref 1.7–2.4)

## 2017-08-09 LAB — GLUCOSE, CAPILLARY
GLUCOSE-CAPILLARY: 108 mg/dL — AB (ref 65–99)
Glucose-Capillary: 106 mg/dL — ABNORMAL HIGH (ref 65–99)
Glucose-Capillary: 104 mg/dL — ABNORMAL HIGH (ref 65–99)
Glucose-Capillary: 129 mg/dL — ABNORMAL HIGH (ref 65–99)

## 2017-08-09 MED ORDER — JEVITY 1.2 CAL PO LIQD: 1000.0000 mL | ORAL | Status: DC

## 2017-08-09 NOTE — Plan of Care (Signed)
Problem: Tissue Perfusion: Goal: Risk factors for ineffective tissue perfusion will decrease Outcome: Progressing VSS and WNL

## 2017-08-09 NOTE — Progress Notes (Signed)
[place] TEAM 1 - Stepdown/ICU TEAM  NISHANT SCHRECENGOST  JKK:[id number] DOB: 08/27/1946 DOA: 07/25/2017 PCP: [name], MD    Brief Narrative:  71yo M with hx of anxiety, alcohol abuse, depression, lung cancer, and COPD for whom EMS was called the a.m. of 10/13 after the patient was found lying on the floor of his cold home by a friend. He had last been seen normal on 10/12 around noon. EMS noted ST elevation with left upper extremity trauma. He was found to be hypothermic and tremulous.  In the ED he complained of left arm pain, denied chest discomfort or shortness of breath. 12-lead showed atrial fibrillation, ST depression in V1 through V3, also concerns of nonspecific intraventricular conduction delay.  His initial temperature was 87.49F, he was bradycardic, and had rapid respiratory rate. His initial lactic acid was 16, hemoglobin 8. A chest x-ray demonstrated fracture of the proximal left humerus. His troponin was mildly elevated.  He was treated with warmed IV fluids, arm was immobilized, seen by Dr. Ellyn Hack who felt EKG was consistent with inferior MI. He recommended IV heparin, and supportive care for medical issues prior to further cardiac intervention. Given his degree of metabolic disarray critical care was asked to admit.  Significant Events: 10/13 - found down - admit to [facility] 10/14 TTE - EF 60-65% - poor quality - ?AoS   Subjective: Remains alert and conversant though confused.  No agitation.  Denies pain.  Tube feedings have been initiated via his new PEG tube and advanced to goal rate without difficulty at this time.  Assessment & Plan:  Acute hypoxic resp failure - transient aspiration pneumonitis  Suspect the patient suffered an episode of aspiration of NG feeding 10/22 - immediate CXR was w/o acute findings - the patient has improved markedly - he has been weaned from supplemental oxygen - completed 5 days of antibiotic therapy  Acute inferior ST elevation  myocardial infarction marked inferior ST elevation improved with hydration and warming - Cardiology has signed off - medical management only - TTE w/o evidence of signif LV impairment   Parox Afib Maintaining sinus rhythm at this time - not an appropriate candidate for anticoagulation given exceedingly high fall risk  Rhabdomyolysis  Resolved w/ volume resuscitation   Bradycardia Resolved   Severe lactic acidosis resolved   Acute kidney injury renal function has normalized w/ volume resuscitation   Acute urinary retention Urinary retention persists with 2 failed attempts at Foley discontinuation despite use of Flomax and urecholine - follow urine output closely  Hypokalemia Corrected to goal at this time  Severe malnutrition in setting of alcohol abuse S/p cortrak 10/19 - SLP re-eval 10/23 continued to suggest NPO - PEG tube placed 10/26 w/ Dr. Sherral Hammers, Dr. Thereasa Solo, and impartial Dr. Lake Bells agreeing this is most appropriate - tolerating tube feeding per PEG tube without difficulty  Microcytic anemia Fe studies c/w severe malnutrition associated anemia - Hgb holding steady for now   History of laryngeal and lung cancer records not currently available - reportedly treated 10 years ago     Hyperglycemia A1c not c/w DM - follow CBGs on tube feeding  Dementia complicated by Acute delirium - History of alcohol abuse / EtOH withdrawal CIWA protocol has been completed - hx from neighbors/friends suggest a baseline of cognitive decline, perhaps due to EtOH, or possibly dementia of other cause - at present his delirium appears to have resolved with his baseline dementia persisting  Left proximal humerus fracture Keep immobilized -  Ortho reports wound is amenable to non-operative tx - non-weightbearing w/ continued use of sling   Goals of Care Pt has no family available to speak on his behalf - given his overall very poor state I do not feel that a code or use of mechanical  ventilation would be appropriate - PCCM has seen the pt and agrees - the pt himself has also clearly stated he would not want life support or CPR - pt is now NCB/DNR - I have discussed placement within a skilled nursing facility with the patient and presently he is agreeable to this   DVT prophylaxis: lovenox   Code Status: DNR - NO CODE Family Communication: no family present at time of exam  Disposition Plan: now ready for SNF placement  Consultants:  PCCM Orthopedics Cardiology   Antimicrobials:  Zosyn 10/13 > 10/15 + 10/23 > 10/27 Vanc 10/13 > 10/15  Objective: Blood pressure 122/74, pulse 74, temperature 97.8 F (36.6 C), temperature source Oral, resp. rate (!) 22, height 5\' 7"  (1.702 m), weight 62.4 kg (137 lb 8 oz), SpO2 94 %.  Intake/Output Summary (Last 24 hours) at 08/09/17 1651 Last data filed at 08/09/17 1600  Gross per 24 hour  Intake           840.35 ml  Output              300 ml  Net           540.35 ml   Filed Weights   08/07/17 0338 08/08/17 0446 08/09/17 0532  Weight: 63.8 kg (140 lb 9.6 oz) 62.7 kg (138 lb 4.8 oz) 62.4 kg (137 lb 8 oz)    Examination: General: No acute respiratory distress  Lungs: CTA B  Cardiovascular: RRR Abdomen: NT/ND, soft, bowel sounds positive, PEG insertion clean and dry  Extremities: No C/C/E B LE   CBC:  Recent Labs Lab 08/04/17 0434 08/05/17 0505 08/07/17 0555 08/08/17 0416 08/09/17 0340  WBC 19.9* 11.8* 6.9 8.0 5.9  HGB 7.8* 8.0* 7.9* 7.8* 7.9*  HCT 25.5* 26.1* 26.2* 26.7* 26.7*  MCV 76.8* 77.4* 77.3* 78.8 79.0  PLT 265 249 333 377 956*   Basic Metabolic Panel:  Recent Labs Lab 08/03/17 0358 08/04/17 0434 08/05/17 0505 08/05/17 1439 08/07/17 0555 08/08/17 0416 08/09/17 0340  NA 133* 134* 137  --  136 136 137  K 3.5 3.5 3.1* 3.9 3.0* 2.7* 4.4  CL 105 105 107  --  104 104 110  CO2 21* 22 24  --  22 20* 21*  GLUCOSE 133* 108* 109*  --  90 81 107*  BUN 10 13 12   --  6 5* <5*  CREATININE 0.63 0.47*  0.41*  --  0.48* 0.57* 0.47*  CALCIUM 7.2* 7.4* 7.4*  --  7.5* 7.2* 7.6*  MG 1.8  --   --  2.1 2.0  --  2.3   GFR: Estimated Creatinine Clearance: 73.7 mL/min (A) (by C-G formula based on SCr of 0.47 mg/dL (L)).  Liver Function Tests:  Recent Labs Lab 08/04/17 0434  AST 31  ALT 20  ALKPHOS 144*  BILITOT 1.2  PROT 4.6*  ALBUMIN 1.8*   Cardiac Enzymes:  Recent Labs Lab 08/03/17 0358  CKTOTAL 100    HbA1C: Hgb A1c MFr Bld  Date/Time Value Ref Range Status  07/28/2017 02:35 AM 5.5 4.8 - 5.6 % Final    Comment:    (NOTE) Pre diabetes:          5.7%-6.4% Diabetes:              >  6.4% Glycemic control for   <7.0% adults with diabetes       Recent Results (from the past 240 hour(s))  Surgical PCR screen     Status: None   Collection Time: 08/06/17 11:23 PM  Result Value Ref Range Status   MRSA, PCR NEGATIVE NEGATIVE Final   Staphylococcus aureus NEGATIVE NEGATIVE Final    Comment: (NOTE) The Xpert SA Assay (FDA approved for NASAL specimens in patients 71 years of age and older), is one component of a comprehensive surveillance program. It is not intended to diagnose infection nor to guide or monitor treatment.   Culture, blood (routine x 2)     Status: None (Preliminary result)   Collection Time: 08/07/17 12:18 AM  Result Value Ref Range Status   Specimen Description BLOOD LEFT ANTECUBITAL  Final   Special Requests IN PEDIATRIC BOTTLE Blood Culture adequate volume  Final   Culture NO GROWTH 2 DAYS  Final   Report Status PENDING  Incomplete  Culture, blood (routine x 2)     Status: None (Preliminary result)   Collection Time: 08/07/17  2:15 AM  Result Value Ref Range Status   Specimen Description BLOOD LEFT HAND  Final   Special Requests IN PEDIATRIC BOTTLE Blood Culture adequate volume  Final   Culture NO GROWTH 2 DAYS  Final   Report Status PENDING  Incomplete  Urine Culture     Status: None   Collection Time: 08/07/17  4:41 AM  Result Value Ref Range  Status   Specimen Description IN/OUT CATH URINE  Final   Special Requests NONE  Final   Culture NO GROWTH  Final   Report Status 08/08/2017 FINAL  Final     Scheduled Meds: . aspirin  81 mg Per Tube Daily  . atorvastatin  40 mg Per Tube q1800  . chlorhexidine  15 mL Mouth Rinse BID  . clopidogrel  75 mg Per Tube Daily  . enoxaparin (LOVENOX) injection  40 mg Subcutaneous Q24H  . mouth rinse  15 mL Mouth Rinse BID  . metoprolol tartrate  12.5 mg Per Tube BID  . potassium chloride  40 mEq Per Tube TID  . tamsulosin  0.4 mg Oral Daily     LOS: 15 days   Cherene Altes, MD [facility] Office  [phone] Pager - Text Page per Shea Evans as per below:  On-Call/Text Page:      [email or web address]      password [email or web address]  If 7PM-7AM, please contact night-coverage [email or web address] Password [email or web address] 08/09/2017, 4:51 PM

## 2017-08-10 LAB — BASIC METABOLIC PANEL
ANION GAP: 7 (ref 5–15)
BUN: 5 mg/dL — ABNORMAL LOW (ref 6–20)
CALCIUM: 7.5 mg/dL — AB (ref 8.9–10.3)
CO2: 21 mmol/L — ABNORMAL LOW (ref 22–32)
Chloride: 107 mmol/L (ref 101–111)
Creatinine, Ser: 0.46 mg/dL — ABNORMAL LOW (ref 0.61–1.24)
GFR calc Af Amer: >60 mL/min (ref >60)
GLUCOSE: 118 mg/dL — AB (ref 65–99)
Potassium: 4.2 mmol/L (ref 3.5–5.1)
SODIUM: 135 mmol/L (ref 135–145)

## 2017-08-10 LAB — GLUCOSE, CAPILLARY
Glucose-Capillary: 109 mg/dL — ABNORMAL HIGH (ref 65–99)
Glucose-Capillary: 114 mg/dL — ABNORMAL HIGH (ref 65–99)

## 2017-08-10 MED ORDER — JEVITY 1.2 CAL PO LIQD: 1000.0000 mL | ORAL | 0 refills | Status: AC

## 2017-08-10 MED ORDER — ASPIRIN 81 MG PO CHEW: 81.0000 mg | CHEWABLE_TABLET | Freq: Every day | ORAL | Status: AC

## 2017-08-10 MED ORDER — TAMSULOSIN HCL 0.4 MG PO CAPS: 0.4000 mg | ORAL_CAPSULE | Freq: Every day | ORAL | Status: AC

## 2017-08-10 MED ORDER — CLOPIDOGREL BISULFATE 75 MG PO TABS: 75.0000 mg | ORAL_TABLET | Freq: Every day | ORAL | Status: AC

## 2017-08-10 MED ORDER — ATORVASTATIN CALCIUM 40 MG PO TABS: 40.0000 mg | ORAL_TABLET | Freq: Every day | ORAL | Status: AC

## 2017-08-10 MED ORDER — ACETAMINOPHEN 160 MG/5ML PO SOLN: 650.0000 mg | Freq: Four times a day (QID) | ORAL | 0 refills | Status: AC | PRN

## 2017-08-10 MED ORDER — METOPROLOL TARTRATE 25 MG/10 ML ORAL SUSPENSION: 12.5000 mg | Freq: Two times a day (BID) | ORAL | Status: DC

## 2017-08-10 NOTE — Care Management Important Message (Signed)
Important Message  Patient Details  Name: David Lucero MRN: [id number] Date of Birth: Jul 16, 1946   Medicare Important Message Given:  Yes    Nathen May 08/10/2017, 9:35 AM

## 2017-08-10 NOTE — Progress Notes (Signed)
Patient will discharge to [facility]. Anticipated discharge date: 08/10/17 Family notified: Christell Faith, friend Transportation by: Corey Harold  Nurse to call report to [phone]. Patient will go to room 401P on [facility] section of SNF.  CSW signing off.  Estanislado Emms, [place]  Clinical Social Worker

## 2017-08-10 NOTE — Discharge Summary (Signed)
DISCHARGE SUMMARY  David Lucero  MR#: [id number]  DOB:Lucero 20, 1947  Date of Admission: 07/25/2017 Date of Discharge: 08/10/2017  Attending Physician:David Lucero  Patient's FXT:[id number], David Alert, MD  Consults: PCCM Orthopedics Cardiology   Disposition: D/C to SNF   Follow-up Appts:  Contact information for follow-up providers    David Stairs, MD. Schedule an appointment as soon as possible for a visit in 2 week(s).   Specialty:  Orthopedic Surgery Contact information: [address] [place] [address] [phone]            Contact information for after-discharge care    Destination    [facility] Follow up.   Specialty:  [place] information: [place] [phone]                  Tests Needing Follow-up: -begin PT/OT of L arm on August 14, 2017 -continue to wear sling/swath on L shoulder until August 14, 2017 or otherwise advised by his Orthopedist  -follow for tolerance of tube feeds - exercise aspiration precautions   Discharge Diagnoses: Acute hypoxic resp failure - transient aspiration pneumonitis  Acute inferior ST elevation myocardial infarction Parox Afib Rhabdomyolysis  Bradycardia Severe lactic acidosis Acute kidney injury Acute urinary retention Hypokalemia Severe malnutrition in setting of alcohol abuse Microcytic anemia History of laryngeal and lung cancer Hyperglycemia Dementia complicated by Acute delirium - History of alcohol abuse / EtOH withdrawal Left proximal humerus fracture NO CODE BLUE - DNR   Initial presentation: 71yo M with hx of anxiety, alcohol abuse, depression, lung cancer, and COPD for whom EMS was called the a.m. of 10/13 after the patient was found lying on the floor of his cold home by a friend. He had last been seen normal on 10/12 around noon. EMS noted ST elevation with left upper extremity trauma.  He was found to be hypothermic and tremulous.  In the ED he complained of left arm pain, denied chest discomfort or shortness of breath. 12-lead showed atrial fibrillation, ST depression in V1 through V3, also concerns of nonspecific intraventricular conduction delay.  His initial temperature was 87.82F, he was bradycardic, and had rapid respiratory rate. His initial lactic acid was 16, hemoglobin 8. A chest x-ray demonstrated fracture of the proximal left humerus. His troponin was mildly elevated.  He was treated with warmed IV fluids, arm was immobilized, seen by Dr. Ellyn Lucero who felt EKG was consistent with inferior MI. He recommended IV heparin, and supportive care for medical issues prior to further cardiac intervention. Given his degree of metabolic disarray critical care was asked to admit.  Hospital Course:  Acute hypoxic resp failure - transient aspiration pneumonitis  Suspect the patient suffered an episode of aspiration of NG feeding 10/22 - immediate CXR was w/o acute findings - the patient improved markedly over the course of the hospital stay - he has been weaned from supplemental oxygen - completed 5 days of antibiotic therapy - sat 100% on RA at time of d/c   Acute inferior ST elevation myocardial infarction marked inferior ST elevation improved with hydration and warming - Cardiology evaluated during the hospital stay - medical management only as intervention was not felt to offer him any benefit - TTE w/o evidence of signif LV impairment   Parox Afib Maintaining sinus rhythm at the time of d/c - not an appropriate candidate for anticoagulation given exceedingly high fall risk  Rhabdomyolysis  Resolved w/ volume resuscitation  Bradycardia Resolved   Severe lactic acidosis resolved   Acute kidney injury renal function normalized w/ volume resuscitation and was stable at time of d/c   Acute urinary retention Urinary retention persisted with 2 failed attempts at  Foley discontinuation despite use of Flomax and urecholine - was finally able to d/c foley w/ use of condom cath for final portion of hospital stay   Hypokalemia Corrected to goal and stable at time of d/c   Severe malnutrition in setting of alcohol abuse S/p cortrak 10/19 - SLP re-eval 10/23 continued to suggest NPO - PEG tube placed 10/26 w/ David Lucero, David Lucero, and impartial David Lucero agreeing this was most appropriate - tolerating tube feeding per PEG tube without difficulty - PEG insertion unremarkable at time of d/c   Microcytic anemia Fe studies c/w severe malnutrition associated anemia - Hgb holding steady at time of d/c    History of laryngeal and lung cancer records not currently available - reportedly treated 10 years ago   Hyperglycemia A1c not c/w DM - followed CBGs on tube feeding but do not feel this is necessary at SNF   Dementia complicated by Acute delirium - History of alcohol abuse / EtOH withdrawal CIWA protocol was completed - hx from neighbors/friends suggested a baseline of cognitive decline, perhaps due to EtOH, or possibly dementia of other cause - at present his delirium appears to have resolved with his baseline dementia persisting - he is pleasant and cooperative at time of d/c, and he agrees to SNF placement   Left proximal humerus fracture Keep immobilized - Ortho reports wound is amenable to non-operative tx - non-weightbearing w/ continued use of sling - Ortho suggests initiating PT of arm ~3 weeks post date of injury (~10/12/118) - to f/u w/ David Lucero at [facility] in outpt setting   Goals of Care Pt has no family available to speak on his behalf - given his overall very poor state I did not feel that a code or use of mechanical ventilation would be appropriate - PCCM agreed - the pt himself also clearly stated he would not want life support or CPR - pt is now NCB/DNR - I have discussed placement within a skilled nursing facility with the  patient and he is agreeable to this - he is hopeful that he will eventually be able to return home, but I have explained that marked improvement in his cognitive ability would be required for this to happen    Allergies as of 08/10/2017   No Known Allergies     Medication List    STOP taking these medications   acetaminophen 500 MG tablet Commonly known as:  TYLENOL Replaced by:  acetaminophen 160 MG/5ML solution   LORazepam 0.5 MG tablet Commonly known as:  ATIVAN   traZODone 150 MG tablet Commonly known as:  DESYREL     TAKE these medications   acetaminophen 160 MG/5ML solution Commonly known as:  TYLENOL Place 20.3 mLs (650 mg total) into feeding tube every 6 (six) hours as needed for mild pain, headache or fever. Replaces:  acetaminophen 500 MG tablet   aspirin 81 MG chewable tablet Place 1 tablet (81 mg total) into feeding tube daily.   atorvastatin 40 MG tablet Commonly known as:  LIPITOR Place 1 tablet (40 mg total) into feeding tube daily at 6 PM.   clopidogrel 75 MG tablet Commonly known as:  PLAVIX Place 1 tablet (75 mg total) into feeding tube daily.   feeding  supplement (JEVITY 1.2 CAL) Liqd Place 1,000 mLs into feeding tube continuous.   metoprolol tartrate 25 mg/10 mL Susp Commonly known as:  LOPRESSOR Place 5 mLs (12.5 mg total) into feeding tube 2 (two) times daily.   tamsulosin 0.4 MG Caps capsule Commonly known as:  FLOMAX Take 1 capsule (0.4 mg total) by mouth daily.       Day of Discharge BP (!) 144/97 (BP Location: Right Arm)   Pulse 89   Temp 97.6 F (36.4 C) (Oral)   Resp 20   Ht 5\' 7"  (1.702 m)   Wt 59.1 kg (130 lb 6.4 oz)   SpO2 100%   BMI 20.42 kg/m   Physical Exam: General: No acute respiratory distress Lungs: Clear to auscultation bilaterally without wheezes or crackles Cardiovascular: Regular rate and rhythm without murmur gallop or rub normal S1 and S2 Abdomen: Nontender, nondistended, soft, bowel sounds positive, no  rebound, no ascites, no appreciable mass - PEG insertion clean and dry  Extremities: No significant cyanosis, clubbing, or edema bilateral lower extremities - L arm in sling  Basic Metabolic Panel:  Recent Labs Lab 08/05/17 0505 08/05/17 1439 08/07/17 0555 08/08/17 0416 08/09/17 0340 08/10/17 0446  NA 137  --  136 136 137 135  K 3.1* 3.9 3.0* 2.7* 4.4 4.2  CL 107  --  104 104 110 107  CO2 24  --  22 20* 21* 21*  GLUCOSE 109*  --  90 81 107* 118*  BUN 12  --  6 5* <5* 5*  CREATININE 0.41*  --  0.48* 0.57* 0.47* 0.46*  CALCIUM 7.4*  --  7.5* 7.2* 7.6* 7.5*  MG  --  2.1 2.0  --  2.3  --     Liver Function Tests:  Recent Labs Lab 08/04/17 0434  AST 31  ALT 20  ALKPHOS 144*  BILITOT 1.2  PROT 4.6*  ALBUMIN 1.8*   Coags:  Recent Labs Lab 08/07/17 0555  INR 1.16   CBC:  Recent Labs Lab 08/04/17 0434 08/05/17 0505 08/07/17 0555 08/08/17 0416 08/09/17 0340  WBC 19.9* 11.8* 6.9 8.0 5.9  HGB 7.8* 8.0* 7.9* 7.8* 7.9*  HCT 25.5* 26.1* 26.2* 26.7* 26.7*  MCV 76.8* 77.4* 77.3* 78.8 79.0  PLT 265 249 333 377 402*    CBG:  Recent Labs Lab 08/09/17 1344 08/09/17 1715 08/09/17 2102 08/10/17 0742 08/10/17 1133  GLUCAP 104* 108* 129* 109* 114*    Recent Results (from the past 240 hour(s))  Surgical PCR screen     Status: None   Collection Time: 08/06/17 11:23 PM  Result Value Ref Range Status   MRSA, PCR NEGATIVE NEGATIVE Final   Staphylococcus aureus NEGATIVE NEGATIVE Final    Comment: (NOTE) The Xpert SA Assay (FDA approved for NASAL specimens in patients 25 years of age and older), is one component of a comprehensive surveillance program. It is not intended to diagnose infection nor to guide or monitor treatment.   Culture, blood (routine x 2)     Status: None (Preliminary result)   Collection Time: 08/07/17 12:18 AM  Result Value Ref Range Status   Specimen Description BLOOD LEFT ANTECUBITAL  Final   Special Requests IN PEDIATRIC BOTTLE Blood  Culture adequate volume  Final   Culture NO GROWTH 2 DAYS  Final   Report Status PENDING  Incomplete  Culture, blood (routine x 2)     Status: None (Preliminary result)   Collection Time: 08/07/17  2:15 AM  Result Value Ref Range  Status   Specimen Description BLOOD LEFT HAND  Final   Special Requests IN PEDIATRIC BOTTLE Blood Culture adequate volume  Final   Culture NO GROWTH 2 DAYS  Final   Report Status PENDING  Incomplete  Urine Culture     Status: None   Collection Time: 08/07/17  4:41 AM  Result Value Ref Range Status   Specimen Description IN/OUT CATH URINE  Final   Special Requests NONE  Final   Culture NO GROWTH  Final   Report Status 08/08/2017 FINAL  Final     Time spent in discharge (includes decision making & examination of pt): 35 minutes  08/10/2017, 11:58 AM   Cherene Altes, MD [facility] Office  [phone] Pager [phone]  On-Call/Text Page:      [email or web address]      password [facility]

## 2017-08-10 NOTE — Progress Notes (Signed)
Attempted report to [phone] and [phone]  x 6. PTAR arranged for 1600. Facility to call if any questions. Essance Gatti Ladora Daniel, BSN, RN.

## 2017-08-10 NOTE — Clinical Social Work Placement (Signed)
   CLINICAL SOCIAL WORK PLACEMENT  NOTE  Date:  08/10/2017  Patient Details  Name: David Lucero MRN: [id number] Date of Birth: 1946/05/24  Clinical Social Work is seeking post-discharge placement for this patient at the [place] level of care (*CSW will initial, date and re-position this form in  chart as items are completed):  Yes   Patient/family provided with [place] Work Department's list of facilities offering this level of care within the geographic area requested by the patient (or if unable, by the patient's family).  Yes   Patient/family informed of their freedom to choose among providers that offer the needed level of care, that participate in Medicare, Medicaid or managed care program needed by the patient, have an available bed and are willing to accept the patient.  Yes   Patient/family informed of [place]'s ownership interest in [facility] and [facility], as well as of the fact that they are under no obligation to receive care at these facilities.  PASRR submitted to EDS on 08/03/17     PASRR number received on 08/03/17     Existing PASRR number confirmed on       FL2 transmitted to all facilities in geographic area requested by pt/family on 08/03/17     FL2 transmitted to all facilities within larger geographic area on       Patient informed that his/her managed care company has contracts with or will negotiate with certain facilities, including the following:  [employer]     Yes   Patient/family informed of bed offers received.  Patient chooses bed at [facility]     Physician recommends and patient chooses bed at      Patient to be transferred to [facility] on 08/10/17.  Patient to be transferred to facility by PTAR     Patient family notified on 08/10/17 of transfer.  Name of family member notified:  Christell Faith, friend     PHYSICIAN Please prepare priority discharge summary, including  medications, Please prepare prescriptions     Additional Comment:    _______________________________________________ Estanislado Emms, LCSW 08/10/2017, 10:47 AM

## 2017-08-12 LAB — CULTURE, BLOOD (ROUTINE X 2)
Culture: NO GROWTH
Culture: NO GROWTH
SPECIAL REQUESTS: ADEQUATE
Special Requests: ADEQUATE

## 2017-08-17 ENCOUNTER — Other Ambulatory Visit (HOSPITAL_COMMUNITY): Payer: Self-pay | Admitting: Family Medicine

## 2017-08-17 DIAGNOSIS — R1310 Dysphagia, unspecified: Secondary | ICD-10-CM

## 2017-08-24 ENCOUNTER — Ambulatory Visit (HOSPITAL_COMMUNITY)
Admission: RE | Admit: 2017-08-24 | Discharge: 2017-08-24 | Disposition: A | Discharge status: Home / Self Care | Payer: No Typology Code available for payment source | Source: Ambulatory Visit | Attending: Family Medicine | Admitting: Family Medicine

## 2017-08-24 DIAGNOSIS — R1310 Dysphagia, unspecified: Secondary | ICD-10-CM

## 2017-08-24 DIAGNOSIS — R1312 Dysphagia, oropharyngeal phase: Secondary | ICD-10-CM | POA: Insufficient documentation

## 2017-08-27 ENCOUNTER — Emergency Department (HOSPITAL_COMMUNITY)
Admission: EM | Admit: 2017-08-27 | Discharge: 2017-08-28 | Disposition: A | Discharge status: Home / Self Care | Payer: Medicare Other | Attending: Emergency Medicine | Admitting: Emergency Medicine

## 2017-08-27 ENCOUNTER — Emergency Department (HOSPITAL_COMMUNITY): Payer: Medicare Other

## 2017-08-27 ENCOUNTER — Other Ambulatory Visit: Payer: Self-pay

## 2017-08-27 DIAGNOSIS — Y9389 Activity, other specified: Secondary | ICD-10-CM | POA: Insufficient documentation

## 2017-08-27 DIAGNOSIS — Z23 Encounter for immunization: Secondary | ICD-10-CM | POA: Diagnosis not present

## 2017-08-27 DIAGNOSIS — F17210 Nicotine dependence, cigarettes, uncomplicated: Secondary | ICD-10-CM | POA: Insufficient documentation

## 2017-08-27 DIAGNOSIS — Y999 Unspecified external cause status: Secondary | ICD-10-CM | POA: Insufficient documentation

## 2017-08-27 DIAGNOSIS — W07XXXA Fall from chair, initial encounter: Secondary | ICD-10-CM | POA: Insufficient documentation

## 2017-08-27 DIAGNOSIS — E039 Hypothyroidism, unspecified: Secondary | ICD-10-CM | POA: Diagnosis not present

## 2017-08-27 DIAGNOSIS — Y929 Unspecified place or not applicable: Secondary | ICD-10-CM | POA: Diagnosis not present

## 2017-08-27 DIAGNOSIS — J449 Chronic obstructive pulmonary disease, unspecified: Secondary | ICD-10-CM | POA: Insufficient documentation

## 2017-08-27 DIAGNOSIS — M25512 Pain in left shoulder: Secondary | ICD-10-CM | POA: Diagnosis not present

## 2017-08-27 DIAGNOSIS — Z7982 Long term (current) use of aspirin: Secondary | ICD-10-CM | POA: Diagnosis not present

## 2017-08-27 DIAGNOSIS — Z79899 Other long term (current) drug therapy: Secondary | ICD-10-CM | POA: Diagnosis not present

## 2017-08-27 DIAGNOSIS — W19XXXA Unspecified fall, initial encounter: Secondary | ICD-10-CM

## 2017-08-27 MED ORDER — ACETAMINOPHEN 325 MG PO TABS
650.0000 mg | ORAL_TABLET | Freq: Once | ORAL | Status: AC
  Administered 2017-08-27: 650 mg via ORAL
  Filled 2017-08-27: qty 2

## 2017-08-27 MED ORDER — TETANUS-DIPHTH-ACELL PERTUSSIS 5-2.5-18.5 LF-MCG/0.5 IM SUSP
0.5000 mL | Freq: Once | INTRAMUSCULAR | Status: AC
  Administered 2017-08-28: 0.5 mL via INTRAMUSCULAR
  Filled 2017-08-27: qty 0.5

## 2017-08-27 NOTE — Discharge Instructions (Signed)
Your x-ray showed persistent fracture to your left humerus.  No new fractures were identified.  We recommend 650 mg Tylenol every 4 hours, as needed, for persistent pain.  As you may apply ice to areas of pain/injury to limit inflammation.  Follow-up with your primary care doctor to ensure proper healing.  If you have an orthopedist, follow-up with them as well.  You may return for new or concerning symptoms.

## 2017-08-27 NOTE — ED Provider Notes (Signed)
[facility] DEPT Provider Note   CSN: [id number] Arrival date & time: 08/27/17  2140     History   Chief Complaint Chief Complaint  Patient presents with  . Shoulder Pain    left    HPI David Lucero is a 71 y.o. male.  71 year old male with a history of cancer, COPD, alcohol abuse, peripheral artery disease presents to the emergency department after a fall at [facility] place.  EMS reports that patient had a fall from the chair onto the floor, landing on his left shoulder.  This happened yesterday.  Patient states that he was standing on the table.  He is complaining of pain to his left shoulder from the fall.  Patient reportedly had an x-ray at the facility which was concerning for a clavicle fracture.  Facility decided to transfer him here.  Facility denies loss of consciousness or head trauma.  Patient is requesting Tylenol for his pain.     Past Medical History:  Diagnosis Date  . Acid reflux    from throat cancer  . Anxiety   . Cancer ([place])   . COPD (chronic obstructive pulmonary disease) ([place])   . ETOH abuse   . H/O ETOH abuse   . Lung cancer ([place])   . PAD (peripheral artery disease) ([place])   . PTSD (post-traumatic stress disorder)     Patient Active Problem List   Diagnosis Date Noted  . PAD (peripheral artery disease) ([place]) 02/14/2014  . Chest pain 11/03/2013  . UNSPECIFIED HYPOTHYROIDISM 09/24/2008  . MORBID OBESITY 09/24/2008  . Alcohol abuse 09/24/2008  . PTSD 09/24/2008  . COPD 09/24/2008    Past Surgical History:  Procedure Laterality Date  . IR GASTROSTOMY TUBE MOD SED  08/07/2017       Home Medications    Prior to Admission medications   Medication Sig Start Date End Date Taking? Authorizing Provider  acetaminophen (TYLENOL) 160 MG/5ML solution Place 20.3 mLs (650 mg total) into feeding tube every 6 (six) hours as needed for mild pain, headache or fever. 08/10/17  Yes Cherene Altes, MD  aspirin 81 MG  chewable tablet Place 1 tablet (81 mg total) into feeding tube daily. 08/11/17  Yes Cherene Altes, MD  atorvastatin (LIPITOR) 40 MG tablet Place 1 tablet (40 mg total) into feeding tube daily at 6 PM. 08/10/17  Yes Cherene Altes, MD  clopidogrel (PLAVIX) 75 MG tablet Place 1 tablet (75 mg total) into feeding tube daily. 08/11/17  Yes Cherene Altes, MD  metoprolol tartrate (LOPRESSOR) 25 MG tablet Take 12.5 mg daily by mouth. Via feeding tube   Yes [provider]  Nutritional Supplements (FEEDING SUPPLEMENT, JEVITY 1.2 CAL,) LIQD Place 1,000 mLs into feeding tube continuous. 08/10/17  Yes Cherene Altes, MD  tamsulosin (FLOMAX) 0.4 MG CAPS capsule Take 1 capsule (0.4 mg total) by mouth daily. 08/11/17  Yes Cherene Altes, MD    Family History Family History  Problem Relation Age of Onset  . Heart attack Father     Social History Social History   Tobacco Use  . Smoking status: Current Every Day Smoker    Packs/day: 1.00    Years: 30.00    Pack years: 30.00    Types: Cigarettes  . Smokeless tobacco: Never Used  Substance Use Topics  . Alcohol use: Yes    Alcohol/week: 50.4 oz    Types: 84 Cans of beer per week    Comment: 12 or more beers/day  .  Drug use: No     Allergies   Patient has no known allergies.   Review of Systems Review of Systems Ten systems reviewed and are negative for acute change, except as noted in the HPI.    Physical Exam Updated Vital Signs BP 134/69 (BP Location: Left Arm)   Pulse 85   Temp 98.6 F (37 C) (Oral)   Resp 18   SpO2 98%   Physical Exam  Constitutional: He appears well-developed and well-nourished. No distress.  Thin, frail.  Nontoxic appearing and in no acute distress.  HENT:  Head: Normocephalic and atraumatic.  Eyes: Conjunctivae and EOM are normal. No scleral icterus.  Neck: Normal range of motion.  Cardiovascular: Normal rate, regular rhythm and intact distal pulses.  Distal radial pulse 2+  in the left upper extremity  Pulmonary/Chest: Effort normal. No respiratory distress.  Respirations even and unlabored  Musculoskeletal:  Deformity to the left clavicle without crepitus.  Neurological: He is alert. He exhibits normal muscle tone. Coordination normal.  Sensation to light touch intact in bilateral upper extremities.  Grip strength 5/5 in the left hand.  Skin: Skin is warm and dry. No rash noted. He is not diaphoretic. No erythema. No pallor.  Skin tear over lateral epicondyle.  Psychiatric: He has a normal mood and affect. His behavior is normal.  Nursing note and vitals reviewed.    ED Treatments / Results  Labs (all labs ordered are listed, but only abnormal results are displayed) Labs Reviewed - No data to display  EKG  EKG Interpretation None       Radiology Dg Shoulder Left  Result Date: 08/27/2017 CLINICAL DATA:  Fall EXAM: LEFT SHOULDER - 2+ VIEW COMPARISON:  07/27/2017 FINDINGS: Old left clavicle fracture. Re- demonstrated comminuted displaced proximal humerus fracture involving the neck and greater tuberosity. Distal shaft is displaced anteriorly. There is some callus formation evident. Shaft of the humerus may be slightly more superior in position compared to prior. Old left-sided rib fractures IMPRESSION: 1. Re- demonstrated comminuted, displaced and overriding fracture involving the left humeral neck and greater tuberosity, there may be slight increased cephalad migration of the humerus shaft. Some bony callus formation is evident. 2. Old left clavicle and rib fractures. Electronically Signed   By: Donavan Foil M.D.   On: 08/27/2017 22:54    Procedures Procedures (including critical care time)  Medications Ordered in ED Medications  traMADol (ULTRAM) tablet 50 mg (not administered)  acetaminophen (TYLENOL) tablet 650 mg (650 mg Oral Given 08/27/17 2237)  Tdap (BOOSTRIX) injection 0.5 mL (0.5 mLs Intramuscular Given 08/28/17 0006)     Initial  Impression / Assessment and Plan / ED Course  I have reviewed the triage vital signs and the nursing notes.  Pertinent labs & imaging results that were available during my care of the patient were reviewed by me and considered in my medical decision making (see chart for details).     71 year old male presents to the emergency department from his facility after a witnessed fall without head trauma or loss of consciousness.  Patient reportedly fell on his left side.  He has a skin tear to his left elbow.  He is complaining of pain in his left shoulder with.  Patient neurovascularly intact.  An x-ray was performed which shows stable humeral head fracture with bony callus formation compared to 07/27/2017.  Patient given Tylenol for pain.  He was not given a sling to prevent development of frozen shoulder.  I do not see  specific indication for this, also, as fracture does not appear acute.  Will continue with Tylenol and outpatient basis.  Return precautions discussed and provided. Patient discharged in stable condition to his facility via [facility].   Final Clinical Impressions(s) / ED Diagnoses   Final diagnoses:  Fall, initial encounter  Acute pain of left shoulder    ED Discharge Orders    None       Antonietta Breach, PA-C 08/28/17 [place], MD 08/28/17 1705

## 2017-08-27 NOTE — ED Notes (Signed)
Pt refused icepack.

## 2017-08-27 NOTE — ED Triage Notes (Addendum)
Pt from [facility] ECF . Had fall from chair to floor landing on left shoulder1 day ago and xrays done show left clavicular fracture. No LOC and pt denies striking head and no other S&S of injury noted. Sent to ED by nurse practioner for further care. VS BP: 138/67 HR 72 resp 16 sat 100%.

## 2017-08-27 NOTE — ED Provider Notes (Signed)
Patient reportedly fell tonight.  He denies pain anywhere..  On exam chronically ill-appearing no distress left upper extremity is tender overlying the deltoid area.  No gross deformity.  Radial pulse 2+.  Good capillary refill.   [name], MD 08/27/17 [phone]

## 2017-08-28 MED ORDER — TRAMADOL HCL 50 MG PO TABS: 50.0000 mg | ORAL_TABLET | Freq: Once | ORAL | Status: DC

## 2017-10-13 DEATH — deceased

## 2019-10-21 IMAGING — CT CT ABDOMEN W/O CM
2 of 4 series · 14 of 46 positions shown, 16 images · non-contrast
Comparison: 12/25/2008

CLINICAL DATA: Feeding difficulties, dysphagia, malnutrition,
assess for fluoroscopic gastrostomy insertion

EXAM:
CT ABDOMEN WITHOUT CONTRAST
TECHNIQUE: Multidetector CT imaging of the abdomen was performed following the
standard protocol without IV contrast.

[Series 4: abd/ pelvis 5.0 i30f 2 · axial · 0.84mm/px · z∈[-716,-516]mm · 11 of 46 slices shown, 13 images]
[im 3/46  soft-tissue]
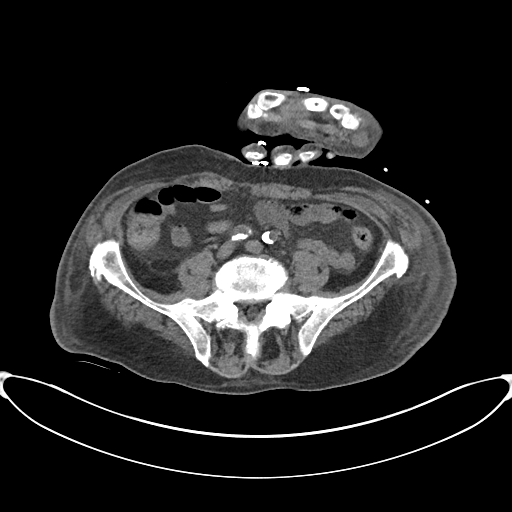
[im 3/46  bone]
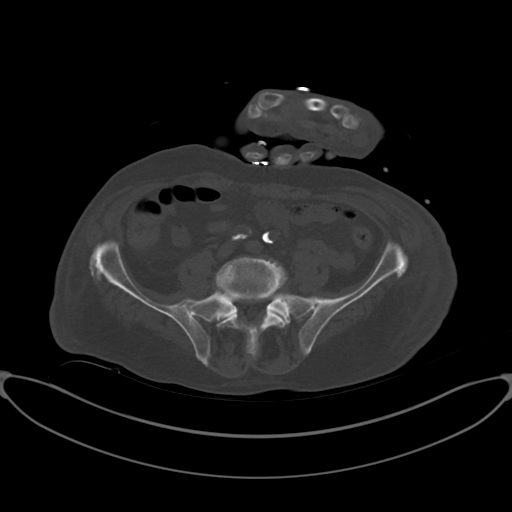
[im 7/46  soft-tissue]
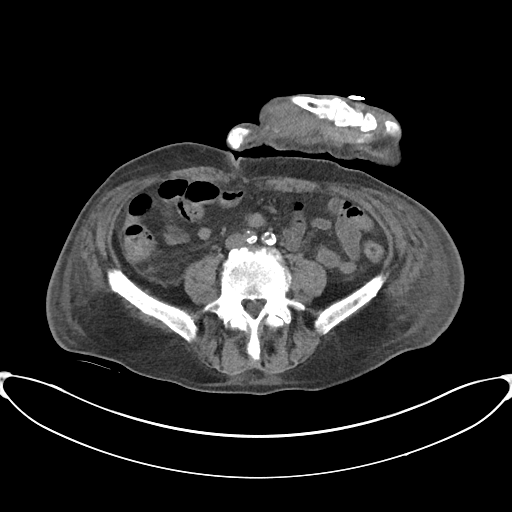
[im 11/46  soft-tissue]
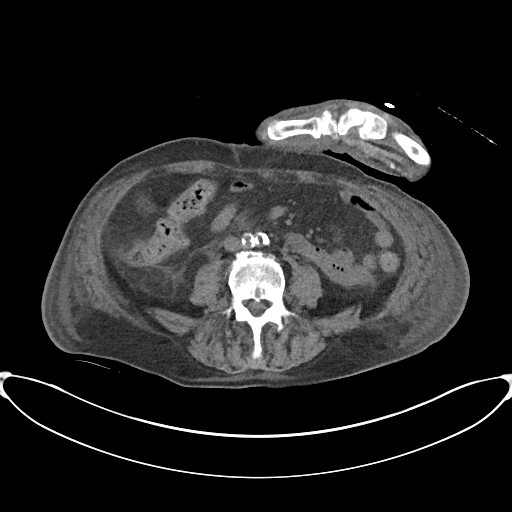
[im 15/46  soft-tissue]
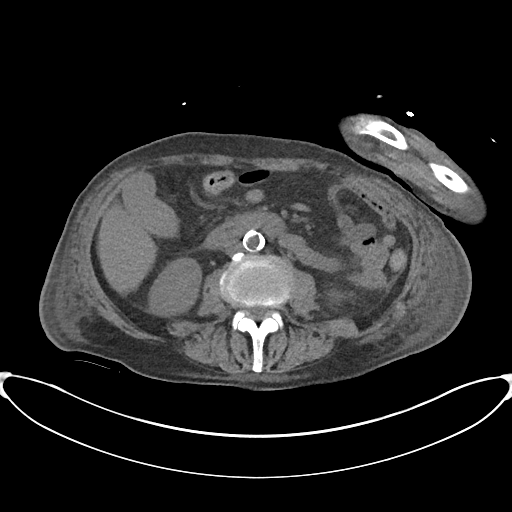
[im 19/46  soft-tissue]
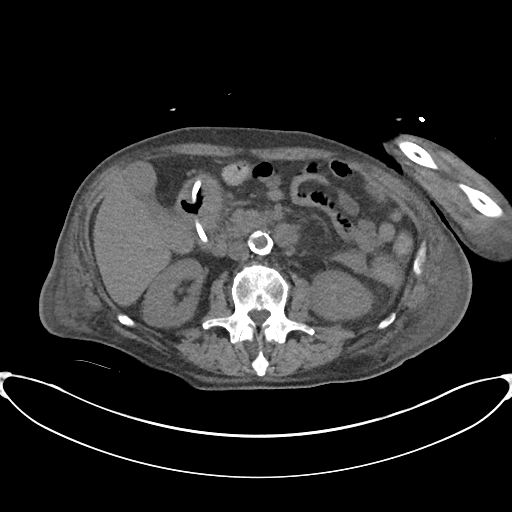
[im 23/46  soft-tissue]
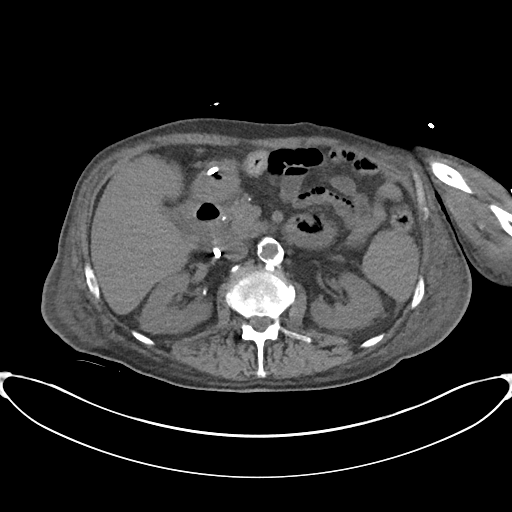
[im 27/46  soft-tissue]
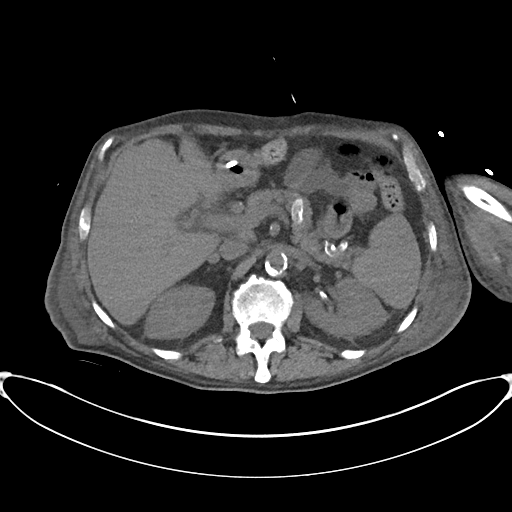
[im 31/46  soft-tissue]
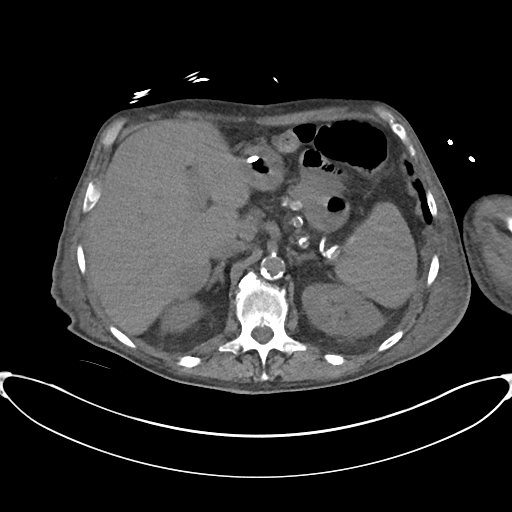
[im 35/46  soft-tissue]
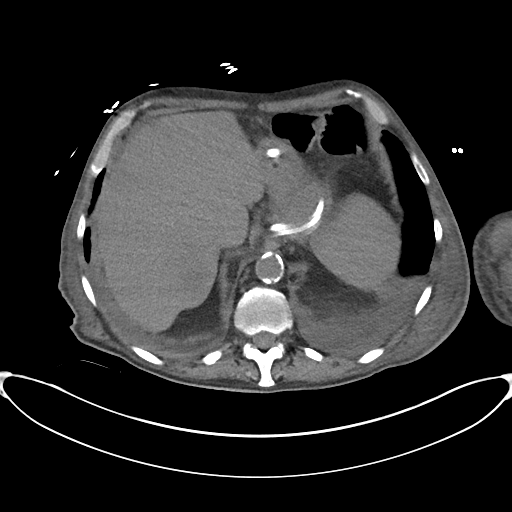
[im 35/46  bone]
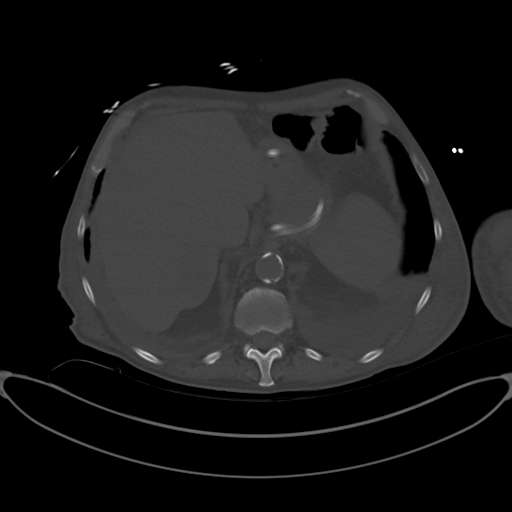
[im 39/46  soft-tissue]
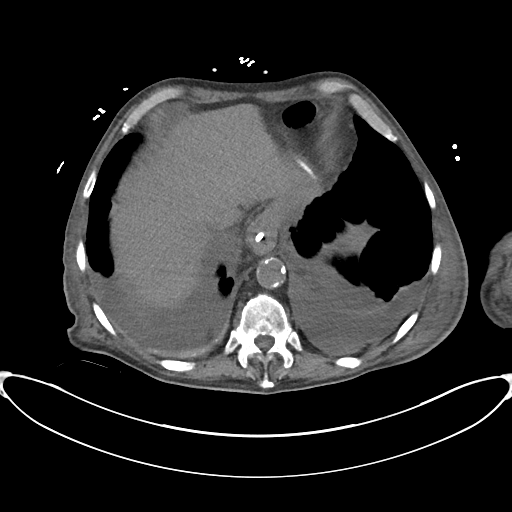
[im 43/46  soft-tissue]
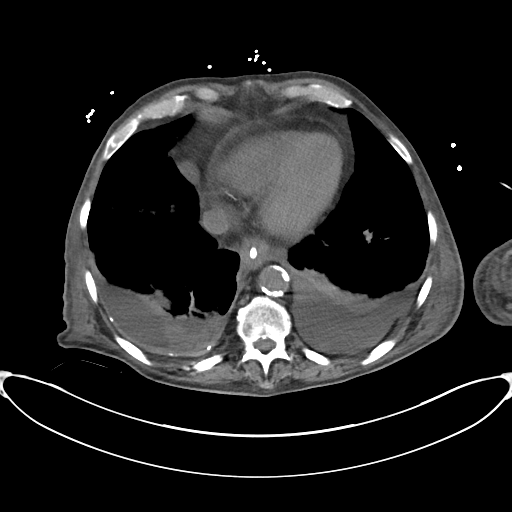

[Series 7: cor st · coronal · 0.46mm/px · 3 of 99 slices shown]
[im 33/99  soft-tissue]
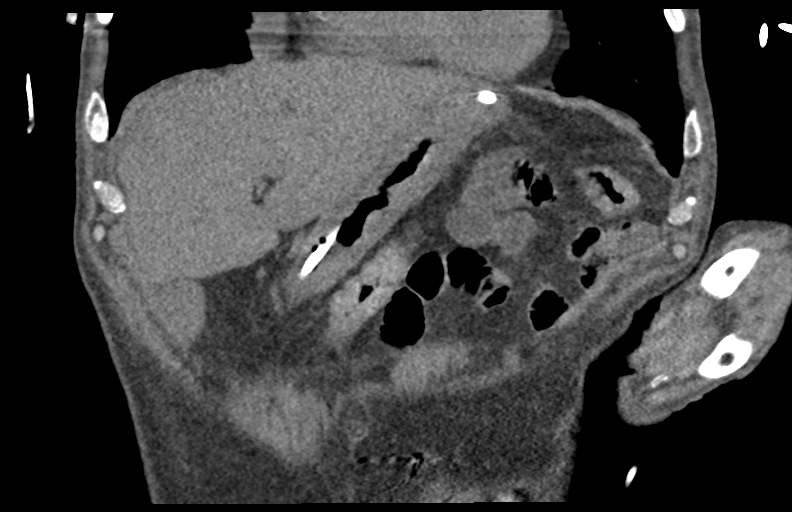
[im 44/99  soft-tissue]
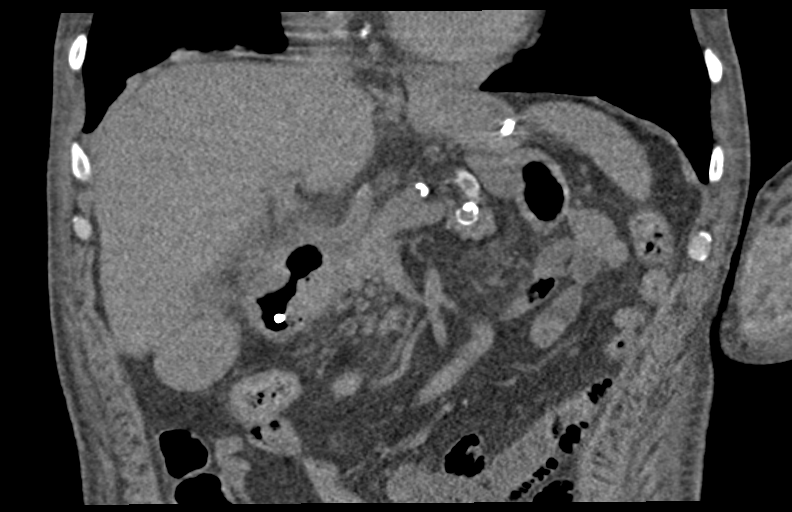
[im 55/99  soft-tissue]
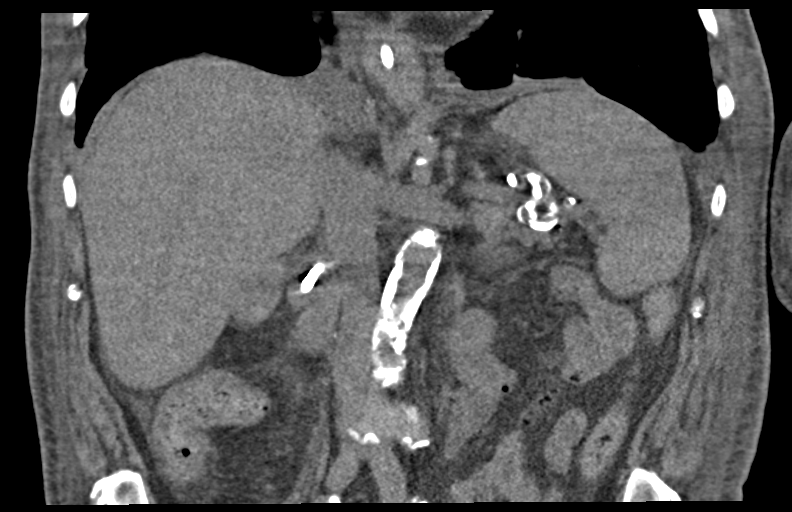

[14 of 46 positions shown; findings below may reference images not displayed]

FINDINGS: Lower chest: Moderate pleural effusions bilaterally. Pleural
thickening and pleural calcifications noted on the right. Associated
bibasilar atelectasis. Left lower lobe 15 mm spiculated nodule is
partially imaged, images 1 through 4 concerning for malignancy.
Recommend dedicated chest CT with contrast if able for further
evaluation.

Hepatobiliary: No biliary dilatation or obstruction. Mild
gallbladder distention with slightly hyperdense intraluminal
contents may be related to sludge. Trace pericholecystic fluid,
nonspecific.

Limited assessment of the liver without contrast. Ill-defined
indeterminate hypodense mass in the posteromedial right liver
roughly measures 5 x 3.5 cm. Additional smaller hypodense lesions in
the hepatic dome. Findings are concerning for malignant metastatic
process in the liver, incompletely evaluated without contrast. No
intrahepatic biliary dilatation.

Pancreas: Unremarkable. No pancreatic ductal dilatation or
surrounding inflammatory changes.

Spleen: Normal in size without focal abnormality.

Adrenals/Urinary Tract: Normal adrenal glands. No renal obstruction
or hydronephrosis. No hydroureter or proximal obstructing ureteral
calculus.

Stomach/Bowel: Stomach is decompressed by feeding tube. Feeding tube
terminates the proximal duodenum. No significant hiatal hernia.
Stomach is in the midline an although decompressed there does appear
to be an anterior percutaneous access window for fluoroscopic
gastrostomy insertion.

Negative for bowel obstruction, significant dilatation or ileus. No
fluid collection or abscess.

Vascular/Lymphatic: Extensive abdominal aortic atherosclerosis.
Negative for aneurysm. No retroperitoneal hemorrhage.

Porta hepatis adenopathy noted with short axis measurement of 17 mm,
image 17.

Other: No abdominal wall hernia.  Body anasarca noted.

Musculoskeletal: Degenerative changes of the spine. No acute osseous
finding or compression fracture.
IMPRESSION: stomach is collapsed but is within the midline of the abdomen
inferior to the costal margin appearing amenable to percutaneous
fluoroscopic gastrostomy insertion.

Small to moderate bilateral pleural effusions with bibasilar
atelectasis. Chronic appearing right pleural thickening with
calcification.

Partially imaged left lower lobe 15 mm spiculated nodule warrants
further imaging. Recommend dedicated chest CT with contrast if able.

At least 3 hepatic hypodensities, largest posteriorly measures 5 cm
concerning for hepatic lesions, possibly metastatic disease.

Porta hepatis mild adenopathy

Extensive atherosclerosis without aneurysm

Nonspecific pericholecystic fluid and body anasarca

## 2019-11-11 IMAGING — CR DG SHOULDER 2+V*L*
2 series · 2 of 2 positions shown · non-contrast
Comparison: 07/27/2017

CLINICAL DATA: Fall

EXAM:
LEFT SHOULDER - 2+ VIEW

[x shoulder ap left (1 of 2)]
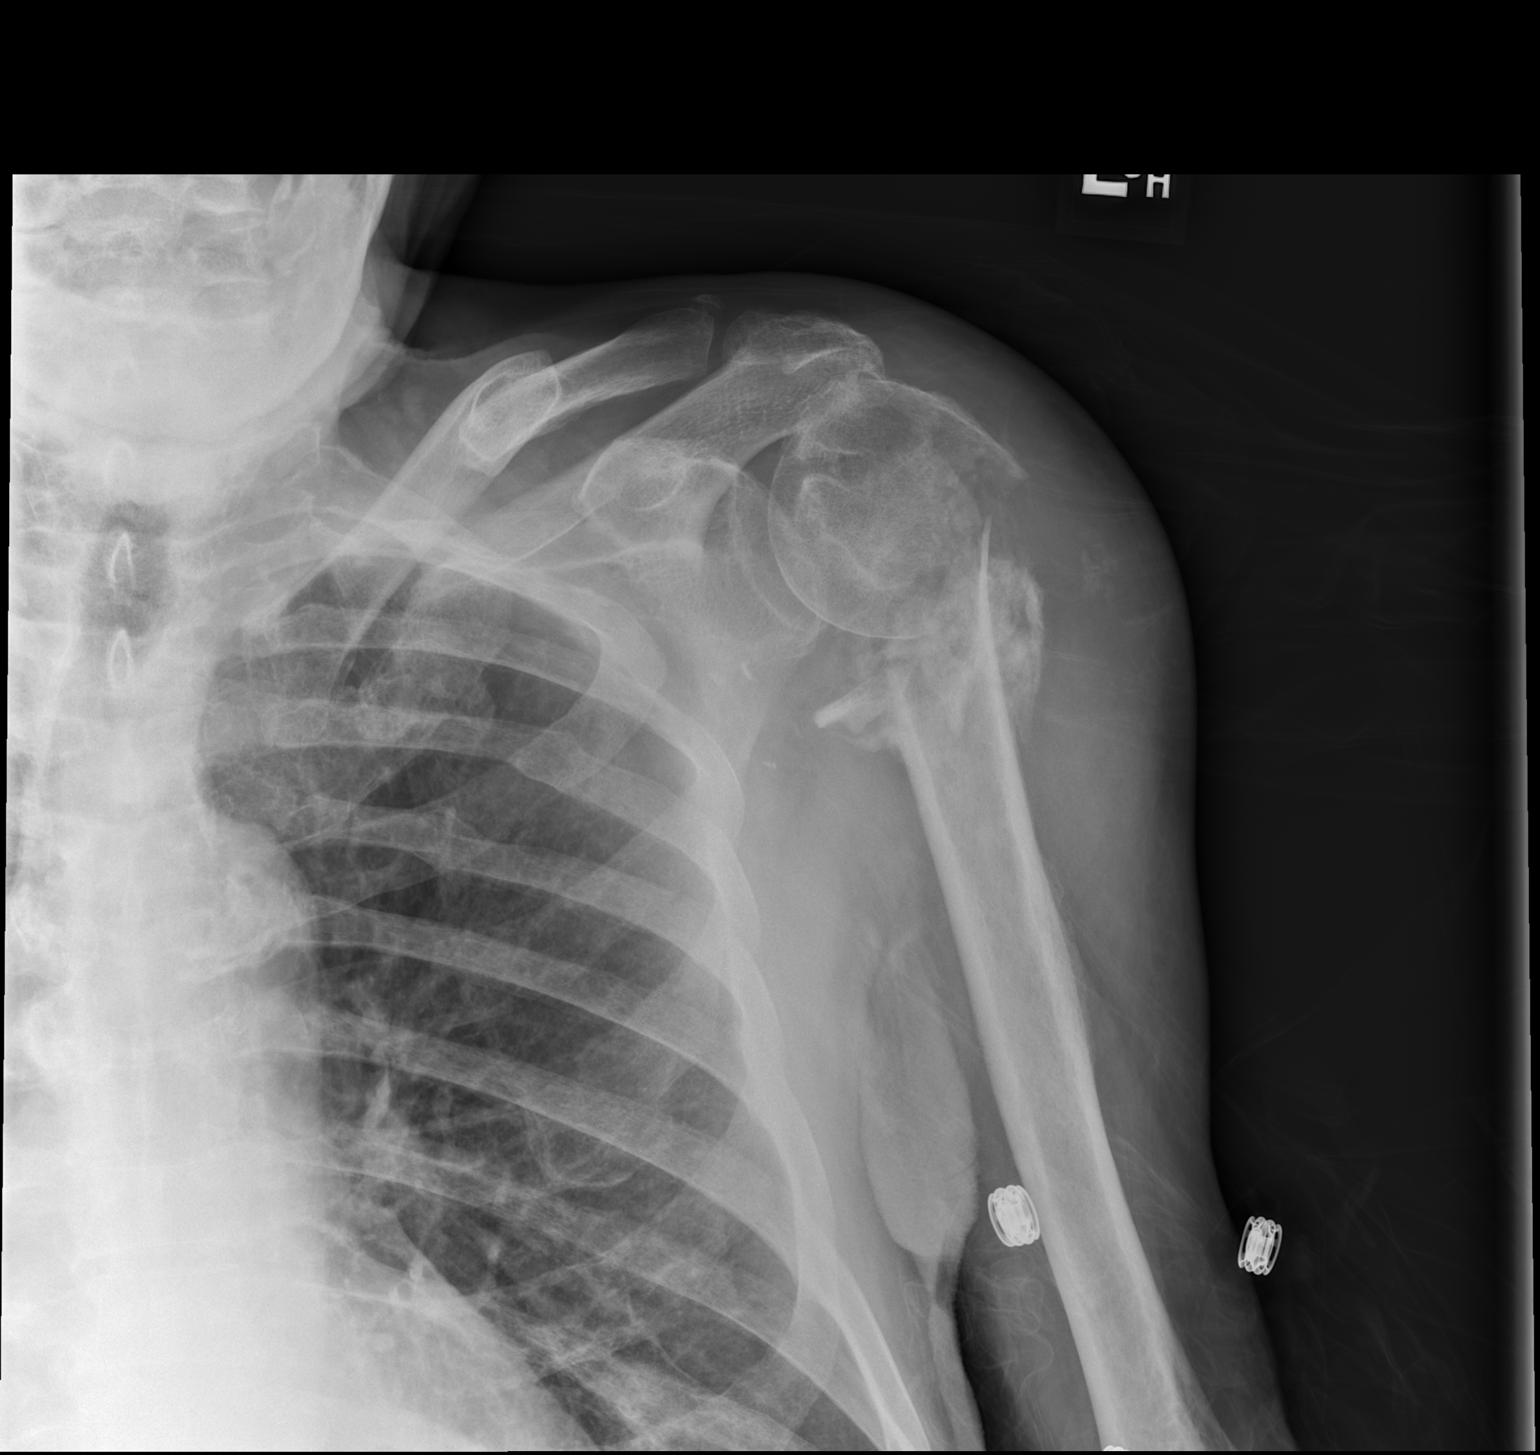

[x shoulder ap left (2 of 2)]
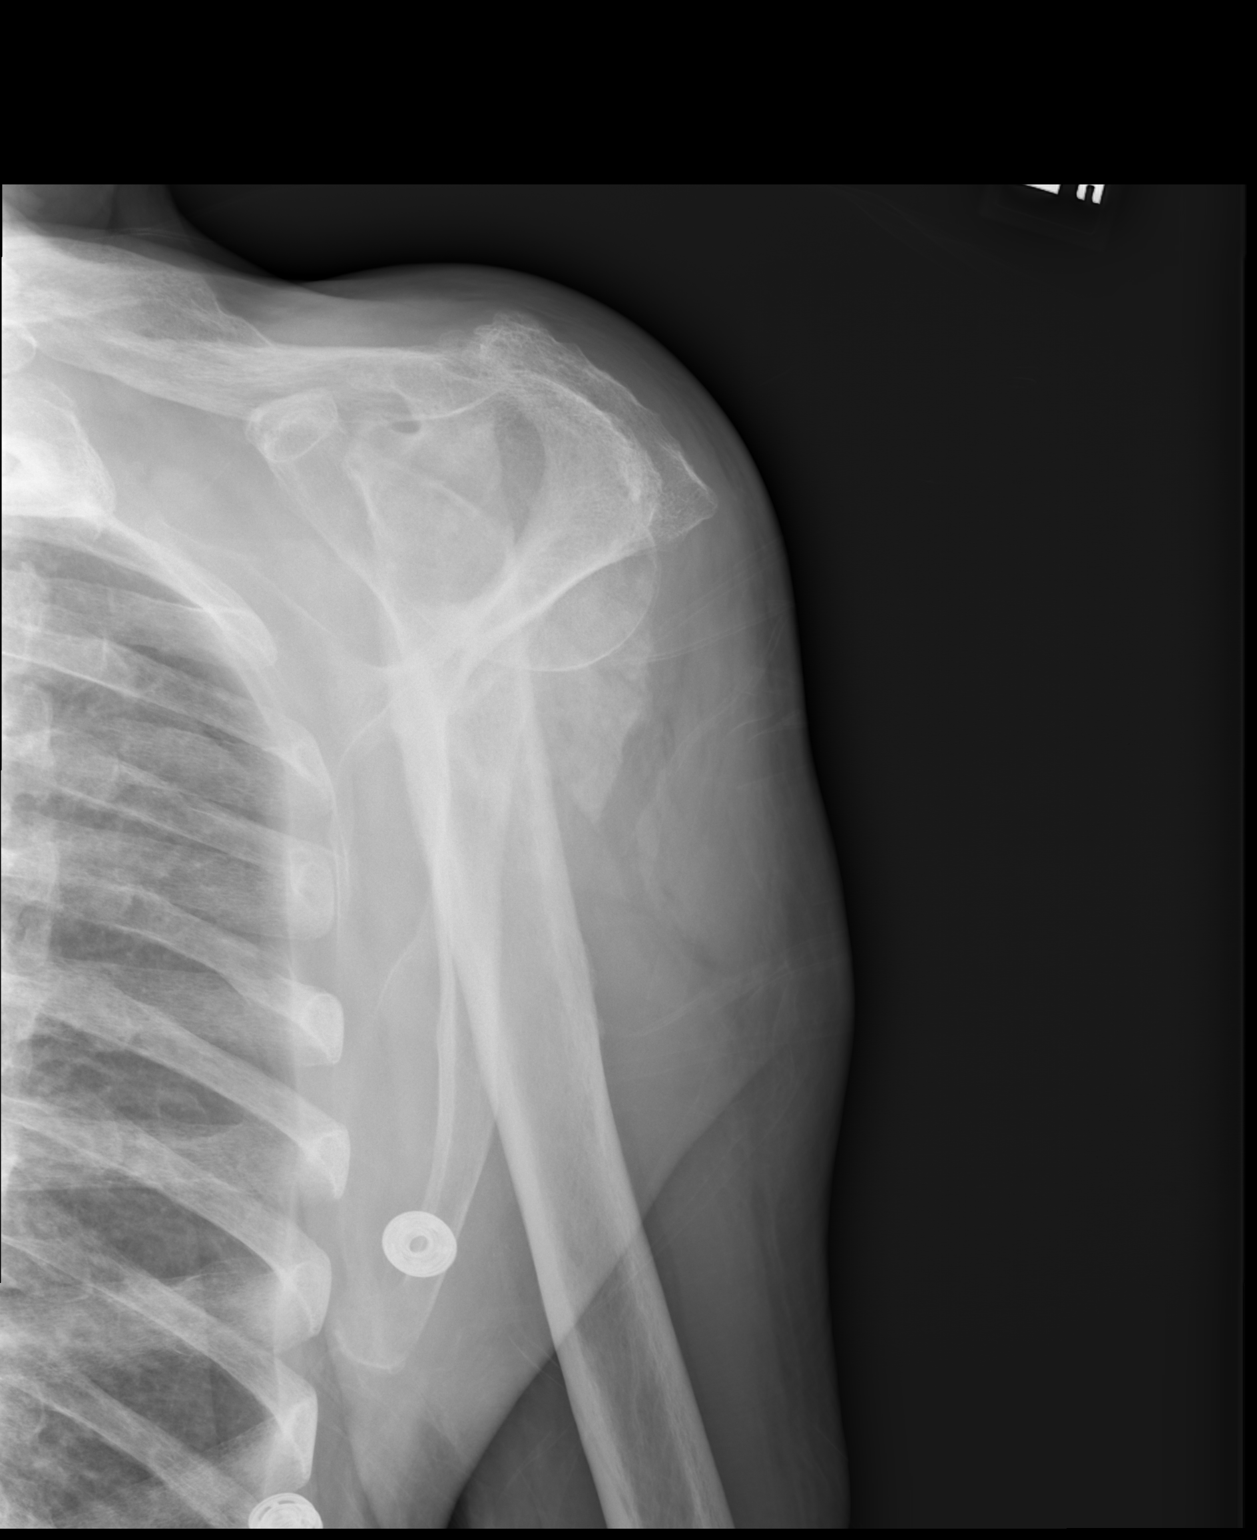

[2 of 2 positions shown; findings below may reference images not displayed]

FINDINGS: Old left clavicle fracture. Re- demonstrated comminuted displaced
proximal humerus fracture involving the neck and greater tuberosity.
Distal shaft is displaced anteriorly. There is some callus formation
evident. Shaft of the humerus may be slightly more superior in
position compared to prior. Old left-sided rib fractures
IMPRESSION: 1. Re- demonstrated comminuted, displaced and overriding fracture
involving the left humeral neck and greater tuberosity, there may be
slight increased cephalad migration of the humerus shaft. Some bony
callus formation is evident.
2. Old left clavicle and rib fractures.
